# Patient Record
Sex: Male | Born: 1970 | Race: White | Hispanic: No | Marital: Married | Smoking: Current some day smoker
Health system: Southern US, Community
[De-identification: ages and names within clinical notes are randomized; demographics above are authoritative.]

## PROBLEM LIST (undated history)

## (undated) DIAGNOSIS — G4730 Sleep apnea, unspecified: Secondary | ICD-10-CM

## (undated) DIAGNOSIS — N529 Male erectile dysfunction, unspecified: Secondary | ICD-10-CM

## (undated) DIAGNOSIS — M109 Gout, unspecified: Secondary | ICD-10-CM

## (undated) DIAGNOSIS — J189 Pneumonia, unspecified organism: Secondary | ICD-10-CM

## (undated) DIAGNOSIS — J449 Chronic obstructive pulmonary disease, unspecified: Secondary | ICD-10-CM

## (undated) DIAGNOSIS — I10 Essential (primary) hypertension: Secondary | ICD-10-CM

## (undated) HISTORY — DX: Gout, unspecified: M10.9

## (undated) HISTORY — DX: Male erectile dysfunction, unspecified: N52.9

## (undated) HISTORY — DX: Chronic obstructive pulmonary disease, unspecified: J44.9

## (undated) HISTORY — DX: Pneumonia, unspecified organism: J18.9

## (undated) HISTORY — DX: Sleep apnea, unspecified: G47.30

## (undated) HISTORY — PX: WISDOM TOOTH EXTRACTION: SHX21

---

## 2003-03-20 ENCOUNTER — Emergency Department (HOSPITAL_COMMUNITY): Admission: EM | Admit: 2003-03-20 | Discharge: 2003-03-20 | Payer: Self-pay | Admitting: Emergency Medicine

## 2007-04-24 ENCOUNTER — Emergency Department (HOSPITAL_COMMUNITY): Admission: EM | Admit: 2007-04-24 | Discharge: 2007-04-24 | Payer: Self-pay | Admitting: Emergency Medicine

## 2011-04-16 LAB — BLOOD GAS, ARTERIAL
Acid-base deficit: 1.2
FIO2: 21
Patient temperature: 37

## 2011-04-16 LAB — BASIC METABOLIC PANEL
BUN: 11
Calcium: 9.1
Chloride: 107
Creatinine, Ser: 1.07
Glucose, Bld: 108 — ABNORMAL HIGH
Sodium: 140

## 2011-04-16 LAB — CBC
MCHC: 35.4
RBC: 4.9
RDW: 12.3

## 2011-04-16 LAB — DIFFERENTIAL
Eosinophils Absolute: 0.2
Eosinophils Relative: 3
Lymphocytes Relative: 22

## 2015-03-03 ENCOUNTER — Emergency Department (HOSPITAL_COMMUNITY): Payer: BLUE CROSS/BLUE SHIELD

## 2015-03-03 ENCOUNTER — Encounter (HOSPITAL_COMMUNITY): Payer: Self-pay | Admitting: Emergency Medicine

## 2015-03-03 ENCOUNTER — Emergency Department (HOSPITAL_COMMUNITY)
Admission: EM | Admit: 2015-03-03 | Discharge: 2015-03-03 | Disposition: A | Discharge status: Home / Self Care | Payer: BLUE CROSS/BLUE SHIELD | Attending: Emergency Medicine | Admitting: Emergency Medicine

## 2015-03-03 DIAGNOSIS — S8991XA Unspecified injury of right lower leg, initial encounter: Secondary | ICD-10-CM | POA: Insufficient documentation

## 2015-03-03 DIAGNOSIS — I10 Essential (primary) hypertension: Secondary | ICD-10-CM | POA: Diagnosis not present

## 2015-03-03 DIAGNOSIS — Y9389 Activity, other specified: Secondary | ICD-10-CM | POA: Insufficient documentation

## 2015-03-03 DIAGNOSIS — Y9289 Other specified places as the place of occurrence of the external cause: Secondary | ICD-10-CM | POA: Insufficient documentation

## 2015-03-03 DIAGNOSIS — Z720 Tobacco use: Secondary | ICD-10-CM | POA: Insufficient documentation

## 2015-03-03 DIAGNOSIS — W010XXA Fall on same level from slipping, tripping and stumbling without subsequent striking against object, initial encounter: Secondary | ICD-10-CM | POA: Insufficient documentation

## 2015-03-03 DIAGNOSIS — Y998 Other external cause status: Secondary | ICD-10-CM | POA: Insufficient documentation

## 2015-03-03 DIAGNOSIS — M25461 Effusion, right knee: Secondary | ICD-10-CM

## 2015-03-03 DIAGNOSIS — Z880 Allergy status to penicillin: Secondary | ICD-10-CM | POA: Insufficient documentation

## 2015-03-03 HISTORY — DX: Essential (primary) hypertension: I10

## 2015-03-03 MED ORDER — OXYCODONE-ACETAMINOPHEN 5-325 MG PO TABS
1.0000 | ORAL_TABLET | Freq: Once | ORAL | Status: AC
Start: 2015-03-03 — End: 2015-03-03
  Administered 2015-03-03: 1 via ORAL
  Filled 2015-03-03: qty 1

## 2015-03-03 MED ORDER — IBUPROFEN 600 MG PO TABS: 600.0000 mg | ORAL_TABLET | Freq: Four times a day (QID) | ORAL | Status: DC | PRN

## 2015-03-03 MED ORDER — OXYCODONE-ACETAMINOPHEN 5-325 MG PO TABS: 1.0000 | ORAL_TABLET | ORAL | Status: DC | PRN

## 2015-03-03 NOTE — Discharge Instructions (Signed)

## 2015-03-03 NOTE — ED Notes (Signed)
Pt reports slipping and falling last night. Injury to R knee. Edema noted in R LE.

## 2015-03-04 NOTE — ED Provider Notes (Signed)
CSN: [id number]     Arrival date & time 03/03/15  1259 History   First MD Initiated Contact with Patient 03/03/15 1318     Chief Complaint  Patient presents with  . Knee Pain     (Consider location/radiation/quality/duration/timing/severity/associated sxs/prior Treatment) The history is provided by the patient.   Chase Christensen is a 44 y.o. male presenting with pain and swelling of his right knee after slipping and falling last night, twisting his right knee during the fall.  He was attempting to move a cow which had died by pulling using a rope when the injury occurred.  He has moderate pain with weight bearing but is comfortable at rest.  He woke today with increased swelling in the knee.  Denies prior injury to this joint and denies any other trauma with this fall.  He has taken no medicines prior to arrival but has applied ice.     Past Medical History  Diagnosis Date  . Hypertension    History reviewed. No pertinent past surgical history. Family History  Problem Relation Age of Onset  . Hypertension Mother   . Cancer Other   . Hypertension Other   . Diabetes Other    Social History  Substance Use Topics  . Smoking status: Current Every Day Smoker -- 0.50 packs/day    Types: Cigarettes  . Smokeless tobacco: Former Neurosurgeon  . Alcohol Use: Yes     Comment: occas    Review of Systems  Constitutional: Negative for fever.  Musculoskeletal: Positive for joint swelling and arthralgias. Negative for myalgias.  Skin: Negative for wound.  Neurological: Negative for weakness and numbness.      Allergies  Penicillins  Home Medications   Prior to Admission medications   Medication Sig Start Date End Date Taking? Authorizing Provider  ibuprofen (ADVIL,MOTRIN) 600 MG tablet Take 1 tablet (600 mg total) by mouth every 6 (six) hours as needed. 03/03/15   Burgess Amor, PA-C  oxyCODONE-acetaminophen (PERCOCET/ROXICET) 5-325 MG per tablet Take 1 tablet by mouth every 4 (four) hours  as needed. 03/03/15   Burgess Amor, PA-C   BP 148/94 mmHg  Pulse 91  Temp(Src) 98.1 F (36.7 C) (Oral)  Resp 20  Ht 5\' 3"  (1.6 m)  Wt 258 lb (117.028 kg)  BMI 45.71 kg/m2  SpO2 95% Physical Exam  Constitutional: He appears well-developed and well-nourished.  HENT:  Head: Atraumatic.  Neck: Normal range of motion.  Cardiovascular:  Pulses equal bilaterally  Musculoskeletal: He exhibits edema and tenderness.       Right knee: He exhibits effusion and bony tenderness. He exhibits no ecchymosis, no deformity, no erythema, normal alignment, no LCL laxity and no MCL laxity. No medial joint line, no lateral joint line, no MCL, no LCL and no patellar tendon tenderness noted.  ttp along upper medial right patella and at the distal rectus femoral tendon.  No palpable deformity.  Pt can SLR without knee joint weakness.  No anterior thigh ttp or deformity appreciated.  Neurological: He is alert. He has normal strength. He displays normal reflexes. No sensory deficit.  Skin: Skin is warm and dry. No erythema.  Skin intact.  Psychiatric: He has a normal mood and affect.    ED Course  Procedures (including critical care time) Labs Review Labs Reviewed - No data to display  Imaging Review Dg Knee Complete 4 Views Right  03/03/2015   CLINICAL DATA:  Injury while attempting to stand a cow up. Twisting and popping injury. Anterior  pain. Initial encounter.  EXAM: RIGHT KNEE - COMPLETE 4+ VIEW  COMPARISON:  None.  FINDINGS: Moderate to large suprapatellar joint effusion. Osseous irregularity about the medial joint space is favored to be degenerative and could represent 1 or more small intra-articular loose bodies. Mild medial and mild to moderate patellofemoral compartment osteoarthritis, including subchondral sclerosis.  IMPRESSION: Moderate to large joint effusion.  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 Suspect intra-articular loose body or bodies about the medial joint space. No donor site identified to suggest  acute fracture.   Electronically Signed   By: Jeronimo Greaves M.D.   On: 03/03/2015 13:51   I have personally reviewed and evaluated these images and lab results as part of my medical decision-making.   EKG Interpretation None      MDM   Final diagnoses:  Knee effusion, right     Radiological studies were viewed, interpreted and considered during the medical decision making and disposition process. I agree with radiologists reading.  Results were also discussed with patient.  Pt denies prior injury to the knee joint.  He was placed in a knee immobilizer and placed on crutches. Advised he will need close ortho f/u, referral given.  RICE advised.  Pt to call for f/u early this week.  Prescribed ibuprofen q 6 hours and oxycodone prn.       Burgess Amor, PA-C 03/04/15 0635  Burgess Amor, PA-C 03/04/15 1610  Zadie Rhine, MD 03/04/15 [phone]

## 2015-03-15 HISTORY — PX: KNEE ARTHROSCOPY: SUR90

## 2015-03-16 ENCOUNTER — Ambulatory Visit: Admit: 2015-03-16 | Payer: Self-pay | Admitting: Specialist

## 2015-03-16 SURGERY — ARTHROSCOPY, KNEE
Anesthesia: General | Site: Knee | Laterality: Right

## 2015-05-01 ENCOUNTER — Ambulatory Visit: Payer: Self-pay | Admitting: Family Medicine

## 2015-05-03 ENCOUNTER — Encounter: Payer: Self-pay | Admitting: Family Medicine

## 2015-05-03 ENCOUNTER — Ambulatory Visit (INDEPENDENT_AMBULATORY_CARE_PROVIDER_SITE_OTHER): Payer: BLUE CROSS/BLUE SHIELD | Admitting: Family Medicine

## 2015-05-03 VITALS — BP 129/85 | HR 92 | Temp 98.7°F | Ht 75.0 in | Wt 255.6 lb

## 2015-05-03 DIAGNOSIS — R5383 Other fatigue: Secondary | ICD-10-CM | POA: Insufficient documentation

## 2015-05-03 DIAGNOSIS — Z0000 Encounter for general adult medical examination without abnormal findings: Secondary | ICD-10-CM | POA: Diagnosis not present

## 2015-05-03 DIAGNOSIS — Z720 Tobacco use: Secondary | ICD-10-CM | POA: Insufficient documentation

## 2015-05-03 DIAGNOSIS — I10 Essential (primary) hypertension: Secondary | ICD-10-CM

## 2015-05-03 DIAGNOSIS — I152 Hypertension secondary to endocrine disorders: Secondary | ICD-10-CM | POA: Insufficient documentation

## 2015-05-03 DIAGNOSIS — R5382 Chronic fatigue, unspecified: Secondary | ICD-10-CM

## 2015-05-03 DIAGNOSIS — F17200 Nicotine dependence, unspecified, uncomplicated: Secondary | ICD-10-CM

## 2015-05-03 MED ORDER — CHLORTHALIDONE 25 MG PO TABS: 25.0000 mg | ORAL_TABLET | Freq: Every day | ORAL | Status: DC

## 2015-05-03 MED ORDER — AMLODIPINE BESYLATE 10 MG PO TABS: 10.0000 mg | ORAL_TABLET | Freq: Every day | ORAL | Status: DC

## 2015-05-03 NOTE — Progress Notes (Signed)
Subjective:    Patient ID: Chase Christensen, male    DOB: 13-Apr-1971, 44 y.o.   MRN: [id number]  HPI 44 year old gentleman presents for his first visit here. He grew up and rocking him [place] but has lived in [place] and now moved back. His problems include hypertension for which she takes amlodipine. He complains of tiredness and ability to follow asleep easily in the daytime. He has been told he snores and has apneic episodes at night. 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 He was told by previous examiner that his neck size would suggest that he may have it. He also has a history of gout with attacks 3 or 4 times a year which are generally relieved by Aleve. In the past he was also told that he has low testosterone. This was tested because of a general lack of energy and loss of sexual performance. He took injections for a short time but not long enough to see any improvement in his fatigue which in retrospect may have been due to sleep apnea all along. He is a smoker but is trying to quit and has scheduled quit date by end of the year. This is a gradual process for him. He has also tried to lose weight. He takes amlodipine for his blood pressure and does have some dependent edema by the end of the day.  There are no active problems to display for this patient.  Outpatient Encounter Prescriptions as of 05/03/2015  Medication Sig  . amLODipine (NORVASC) 10 MG tablet Take 1 tablet by mouth daily.  Marland Kitchen aspirin 325 MG tablet Take 325 mg by mouth daily.  Marland Kitchen ibuprofen (ADVIL,MOTRIN) 600 MG tablet Take 1 tablet (600 mg total) by mouth every 6 (six) hours as needed.  . [DISCONTINUED] oxyCODONE-acetaminophen (PERCOCET/ROXICET) 5-325 MG per tablet Take 1 tablet by mouth every 4 (four) hours as needed.   No facility-administered encounter medications on file as of 05/03/2015.      Review of Systems  Constitutional: Positive for fatigue.    Respiratory: Positive for apnea.   Cardiovascular: Positive for leg swelling.  Genitourinary: Negative.   Neurological: Negative.   Psychiatric/Behavioral: Negative.        Objective:   Physical Exam  Constitutional: He is oriented to person, place, and time. He appears well-developed and well-nourished.  HENT:  Head: Normocephalic.  Right Ear: External ear normal.  Left Ear: External ear normal.  Mouth/Throat: Oropharynx is clear and moist.  Eyes: Pupils are equal, round, and reactive to light.  Neck: Normal range of motion.  Cardiovascular: Normal rate, regular rhythm and normal heart sounds.   Pulmonary/Chest: Effort normal and breath sounds normal.  Abdominal: Soft.  Musculoskeletal: Normal range of motion.  Neurological: He is alert and oriented to person, place, and time.  Psychiatric: He has a normal mood and affect.          Assessment & Plan:  1. Annual physical exam Exam is normal but there are issues that need attention - CMP14+EGFR; Future - Testosterone,Free and Total; Future - Uric acid; Future - CBC with Differential/Platelet; Future  2. Essential hypertension Since he has edema on 10 mg amlodipine, will add chlorthalidone.  3. Chronic fatigue My suspicion is this is related to sleep apnea rather than low testosterone level but we will check the latter  4. Tobacco use disorder Patient in process of stopped smoking  Bertram Millard  Hyacinth Meeker MD  Notice: Thisdictation was prepared with Dragon dictation along with smaller phrase technology. Any transcriptional errors that result from this process are unintentional and may not be corrected upon review

## 2015-05-04 ENCOUNTER — Other Ambulatory Visit: Payer: BLUE CROSS/BLUE SHIELD

## 2015-05-04 DIAGNOSIS — Z0000 Encounter for general adult medical examination without abnormal findings: Secondary | ICD-10-CM

## 2015-05-04 NOTE — Progress Notes (Signed)
Lab only 

## 2015-05-05 LAB — CMP14+EGFR
ALBUMIN: 4.2 g/dL (ref 3.5–5.5)
ALT: 36 IU/L (ref 0–44)
AST: 26 IU/L (ref 0–40)
Albumin/Globulin Ratio: 1.9 (ref 1.1–2.5)
Alkaline Phosphatase: 92 IU/L (ref 39–117)
BUN / CREAT RATIO: 17 (ref 9–20)
BUN: 15 mg/dL (ref 6–24)
Bilirubin Total: 0.4 mg/dL (ref 0.0–1.2)
CALCIUM: 9.5 mg/dL (ref 8.7–10.2)
CO2: 24 mmol/L (ref 18–29)
CREATININE: 0.87 mg/dL (ref 0.76–1.27)
Chloride: 105 mmol/L (ref 97–106)
GFR calc non Af Amer: 106 mL/min/{1.73_m2} (ref >59)
GFR, EST AFRICAN AMERICAN: 122 mL/min/{1.73_m2} (ref >59)
GLUCOSE: 99 mg/dL (ref 65–99)
Globulin, Total: 2.2 g/dL (ref 1.5–4.5)
Potassium: 4.2 mmol/L (ref 3.5–5.2)
Sodium: 142 mmol/L (ref 136–144)
TOTAL PROTEIN: 6.4 g/dL (ref 6.0–8.5)

## 2015-05-05 LAB — CBC WITH DIFFERENTIAL/PLATELET
BASOS ABS: 0 10*3/uL (ref 0.0–0.2)
Basos: 0 %
EOS (ABSOLUTE): 0.4 10*3/uL (ref 0.0–0.4)
Eos: 4 %
HEMOGLOBIN: 16.8 g/dL (ref 12.6–17.7)
Hematocrit: 47.4 % (ref 37.5–51.0)
Immature Grans (Abs): 0 10*3/uL (ref 0.0–0.1)
Immature Granulocytes: 0 %
LYMPHS ABS: 2 10*3/uL (ref 0.7–3.1)
LYMPHS: 21 %
MCH: 33 pg (ref 26.6–33.0)
MCHC: 35.4 g/dL (ref 31.5–35.7)
MCV: 93 fL (ref 79–97)
MONOCYTES: 9 %
Monocytes Absolute: 0.8 10*3/uL (ref 0.1–0.9)
Neutrophils Absolute: 6.1 10*3/uL (ref 1.4–7.0)
Neutrophils: 66 %
PLATELETS: 261 10*3/uL (ref 150–379)
RBC: 5.09 x10E6/uL (ref 4.14–5.80)
RDW: 12.2 % — ABNORMAL LOW (ref 12.3–15.4)
WBC: 9.4 10*3/uL (ref 3.4–10.8)

## 2015-05-05 LAB — TESTOSTERONE,FREE AND TOTAL
TESTOSTERONE: 440 ng/dL (ref 348–1197)
Testosterone, Free: 21 pg/mL (ref 6.8–21.5)

## 2015-05-05 LAB — URIC ACID: URIC ACID: 7.6 mg/dL (ref 3.7–8.6)

## 2015-06-08 ENCOUNTER — Encounter: Payer: Self-pay | Admitting: Family Medicine

## 2015-06-08 ENCOUNTER — Ambulatory Visit (INDEPENDENT_AMBULATORY_CARE_PROVIDER_SITE_OTHER): Payer: BLUE CROSS/BLUE SHIELD | Admitting: Family Medicine

## 2015-06-08 VITALS — BP 149/91 | HR 99 | Temp 98.4°F | Ht 75.0 in | Wt 243.0 lb

## 2015-06-08 DIAGNOSIS — E1169 Type 2 diabetes mellitus with other specified complication: Secondary | ICD-10-CM | POA: Insufficient documentation

## 2015-06-08 DIAGNOSIS — N529 Male erectile dysfunction, unspecified: Secondary | ICD-10-CM | POA: Diagnosis not present

## 2015-06-08 DIAGNOSIS — I10 Essential (primary) hypertension: Secondary | ICD-10-CM | POA: Diagnosis not present

## 2015-06-08 DIAGNOSIS — R0683 Snoring: Secondary | ICD-10-CM | POA: Diagnosis not present

## 2015-06-08 MED ORDER — TADALAFIL 2.5 MG PO TABS: 2.5000 mg | ORAL_TABLET | Freq: Every day | ORAL | Status: DC

## 2015-06-08 MED ORDER — HYDROCHLOROTHIAZIDE 12.5 MG PO CAPS: 12.5000 mg | ORAL_CAPSULE | Freq: Every day | ORAL | Status: DC

## 2015-06-08 NOTE — Progress Notes (Signed)
   HPI  Patient presents today here to discuss erectile dysfunction and hypertension.  ED He states he's had issues for about 5-6 years. He struggled maintaining and getting an erection. He is called his insurance company and Cialis is the best covered medication. He has no chest pain.  Hypertension He does have headaches related to his hypertension when he misses a dose of medication. He never started chlorthalidone He has moderate amlodipine compliance missing a few days a month. He denies chest pain, dyspnea He does have mild leg edema  Snoring Patient explains that he has severe snoring, his girlfriend is present with him in the clinic and agrees  Has severe fatigue throughout the day, he would like to have a sleep study done at home and is working through his insurance to see if this could be arranged.  PMH: Smoking status noted ROS: Per HPI  Objective: BP 149/91 mmHg  Pulse 99  Temp(Src) 98.4 F (36.9 C) (Oral)  Ht 6\' 3"  (1.905 m)  Wt 243 lb (110.224 kg)  BMI 30.37 kg/m2 Gen: NAD, alert, cooperative with exam HEENT: NCAT CV: RRR, good S1/S2, no murmur Resp: CTABL, no wheezes, non-labored Ext: No edema, warm Neuro: Alert and oriented, No gross deficits  Assessment and plan:  # Hypertension Continue amlodipine Started 0.5 mg HCTZ Blood pressure log, follow-up one month  # Erectile dysfunction Cialis, discussed daily dosing which is what they prefer, however they're considering changing it to 5-10 mg as needed  # Snoring Possible sleep apnea, he is exploring his insurance coverage for sleep study Agree with ordering the sleep study should he get it arranged the way that he wants.    Meds ordered this encounter  Medications  . Tadalafil 2.5 MG TABS    Sig: Take 1 tablet (2.5 mg total) by mouth daily.    Dispense:  90 each    Refill:  3  . hydrochlorothiazide (MICROZIDE) 12.5 MG capsule    Sig: Take 1 capsule (12.5 mg total) by mouth daily.   Dispense:  90 capsule    Refill:  3    Murtis SinkSam Bradshaw, MD [facility] 06/08/2015, 4:36 PM

## 2015-06-08 NOTE — Patient Instructions (Signed)
Great to meet you!  Come back in 1 month, keep a blood pressure log  If you decide to use cialis as needed you can use 5 to 10 mg at a time.

## 2015-06-18 ENCOUNTER — Ambulatory Visit: Payer: BLUE CROSS/BLUE SHIELD | Admitting: Family Medicine

## 2015-06-19 ENCOUNTER — Encounter: Payer: Self-pay | Admitting: Family Medicine

## 2015-07-10 ENCOUNTER — Ambulatory Visit: Payer: BLUE CROSS/BLUE SHIELD | Admitting: Family Medicine

## 2015-07-13 ENCOUNTER — Ambulatory Visit (INDEPENDENT_AMBULATORY_CARE_PROVIDER_SITE_OTHER): Payer: BLUE CROSS/BLUE SHIELD | Admitting: Family Medicine

## 2015-07-13 ENCOUNTER — Encounter: Payer: Self-pay | Admitting: Family Medicine

## 2015-07-13 VITALS — BP 134/88 | HR 95 | Temp 97.7°F | Ht 75.0 in | Wt 243.6 lb

## 2015-07-13 DIAGNOSIS — I10 Essential (primary) hypertension: Secondary | ICD-10-CM | POA: Diagnosis not present

## 2015-07-13 DIAGNOSIS — R079 Chest pain, unspecified: Secondary | ICD-10-CM | POA: Diagnosis not present

## 2015-07-13 DIAGNOSIS — N529 Male erectile dysfunction, unspecified: Secondary | ICD-10-CM

## 2015-07-13 NOTE — Patient Instructions (Signed)
Great to see you!  Lets see you back in 3 months unless you need us sooner.   You will be called by cardiology for an appointment  Consider stopping smoking, We could try wellbutrin.   Nonspecific Chest Pain  Chest pain can be caused by many different conditions. There is always a chance that your pain could be related to something serious, such as a heart attack or a blood clot in your lungs. Chest pain can also be caused by conditions that are not life-threatening. If you have chest pain, it is very important to follow up with your health care provider. CAUSES  Chest pain can be caused by:  Heartburn.  Pneumonia or bronchitis.  Anxiety or stress.  Inflammation around your heart (pericarditis) or lung (pleuritis or pleurisy).  A blood clot in your lung.  A collapsed lung (pneumothorax). It can develop suddenly on its own (spontaneous pneumothorax) or from trauma to the chest.  Shingles infection (varicella-zoster virus).  Heart attack.  Damage to the bones, muscles, and cartilage that make up your chest wall. This can include:  Bruised bones due to injury.  Strained muscles or cartilage due to frequent or repeated coughing or overwork.  Fracture to one or more ribs.  Sore cartilage due to inflammation (costochondritis). RISK FACTORS  Risk factors for chest pain may include:  Activities that increase your risk for trauma or injury to your chest.  Respiratory infections or conditions that cause frequent coughing.  Medical conditions or overeating that can cause heartburn.  Heart disease or family history of heart disease.  Conditions or health behaviors that increase your risk of developing a blood clot.  Having had chicken pox (varicella zoster). SIGNS AND SYMPTOMS Chest pain can feel like:  Burning or tingling on the surface of your chest or deep in your chest.  Crushing, pressure, aching, or squeezing pain.  Dull or [place] pain that is worse when you move,  cough, or take a deep breath.  Pain that is also felt in your back, neck, shoulder, or arm, or pain that spreads to any of these areas. Your chest pain may come and go, or it may stay constant. DIAGNOSIS Lab tests or other studies may be needed to find the cause of your pain. Your health care provider may have you take a test called an ambulatory ECG (electrocardiogram). An ECG records your heartbeat patterns at the time the test is performed. You may also have other tests, such as:  Transthoracic echocardiogram (TTE). During echocardiography, sound waves are used to create a picture of all of the heart structures and to look at how blood flows through your heart.  Transesophageal echocardiogram (TEE).This is a more advanced imaging test that obtains images from inside your body. It allows your health care provider to see your heart in finer detail.  Cardiac monitoring. This allows your health care provider to monitor your heart rate and rhythm in real time.  Holter monitor. This is a portable device that records your heartbeat and can help to diagnose abnormal heartbeats. It allows your health care provider to track your heart activity for several days, if needed.  Stress tests. These can be done through exercise or by taking medicine that makes your heart beat more quickly.  Blood tests.  Imaging tests. TREATMENT  Your treatment depends on what is causing your chest pain. Treatment may include:  Medicines. These may include:  Acid blockers for heartburn.  Anti-inflammatory medicine.  Pain medicine for inflammatory conditions.  Antibiotic medicine, if an infection is present.  Medicines to dissolve blood clots.  Medicines to treat coronary artery disease.  Supportive care for conditions that do not require medicines. This may include:  Resting.  Applying heat or cold packs to injured areas.  Limiting activities until pain decreases. HOME CARE INSTRUCTIONS  If you were  prescribed an antibiotic medicine, finish it all even if you start to feel better.  Avoid any activities that bring on chest pain.  Do not use any tobacco products, including cigarettes, chewing tobacco, or electronic cigarettes. If you need help quitting, ask your health care provider.  Do not drink alcohol.  Take medicines only as directed by your health care provider.  Keep all follow-up visits as directed by your health care provider. This is important. This includes any further testing if your chest pain does not go away.  If heartburn is the cause for your chest pain, you may be told to keep your head raised (elevated) while sleeping. This reduces the chance that acid will go from your stomach into your esophagus.  Make lifestyle changes as directed by your health care provider. These may include:  Getting regular exercise. Ask your health care provider to suggest some activities that are safe for you.  Eating a heart-healthy diet. A registered dietitian can help you to learn healthy eating options.  Maintaining a healthy weight.  Managing diabetes, if necessary.  Reducing stress. SEEK MEDICAL CARE IF:  Your chest pain does not go away after treatment.  You have a rash with blisters on your chest.  You have a fever. SEEK IMMEDIATE MEDICAL CARE IF:   Your chest pain is worse.  You have an increasing cough, or you cough up blood.  You have severe abdominal pain.  You have severe weakness.  You faint.  You have chills.  You have sudden, unexplained chest discomfort.  You have sudden, unexplained discomfort in your arms, back, neck, or jaw.  You have shortness of breath at any time.  You suddenly start to sweat, or your skin gets clammy.  You feel nauseous or you vomit.  You suddenly feel light-headed or dizzy.  Your heart begins to beat quickly, or it feels like it is skipping beats. These symptoms may represent a serious problem that is an emergency. Do  not wait to see if the symptoms will go away. Get medical help right away. Call your local emergency services (911 in the [place] Do not drive yourself to the hospital.   This information is not intended to replace advice given to you by your health care provider. Make sure you discuss any questions you have with your health care provider.   Document Released: 04/02/2005 Document Revised: 07/14/2014 Document Reviewed: 01/27/2014 Elsevier Interactive Patient Education [employer]

## 2015-07-13 NOTE — Progress Notes (Signed)
   HPI  Patient presents today today discussed hypertension, chest pain, and erectile dysfunction.  Hypertension Good medication compliance Not checking blood pressure at home No headache, palpitations, leg edema.  Chest pain Over the last 3 weeks he's had intermittent right-sided dull achy chest pain that radiates to his right-sided back. He comes and goes with anxious mood, but also is more noticeable whenever he is working vigorously. It does not worsen with palpation. He has no sweating, shortness of breath, or nausea with it. It is not predictably exertional.  Erectile dysfunction He's been trying Cialis at 10 mg with not much improvement. He's tried Levitra 10 mg and had good improvement.   PMH: Smoking status noted ROS: Per HPI  Objective: BP 134/88 mmHg  Pulse 95  Temp(Src) 97.7 F (36.5 C) (Oral)  Ht 6\' 3"  (1.905 m)  Wt 243 lb 9.6 oz (110.496 kg)  BMI 30.45 kg/m2 Gen: NAD, alert, cooperative with exam HEENT: NCAT CV: RRR, good S1/S2, no murmur, no chest wall tenderness Resp: CTABL, no wheezes, non-labored Ext: No edema, warm Neuro: Alert and oriented, No gross deficits Psych- Denies SI  EKG- NSR   Assessment and plan:  # Hypertension Reasonable control Continue amlodipine and HCTZ Chest pain, see below  # Chest pain Atypical chest pain, sometimes exertional but not predictable Most likely associated with mood, he's had some recent stressors. EKG normal today Reviewed red flags for seeking emergent medical care Cardiology for possible exercise stress test  # erectile dysfunction Recommended daily dosing with Cialis, I'm okay with prescribing Levitra his insurance will cover it    Orders Placed This Encounter  Procedures  . EKG 12-Lead     Murtis SinkSam Shaneka Efaw, MD [facility] 07/13/2015, 4:05 PM

## 2015-07-16 ENCOUNTER — Ambulatory Visit: Payer: BLUE CROSS/BLUE SHIELD | Admitting: Family Medicine

## 2015-08-03 ENCOUNTER — Ambulatory Visit (INDEPENDENT_AMBULATORY_CARE_PROVIDER_SITE_OTHER): Payer: BLUE CROSS/BLUE SHIELD | Admitting: Pediatrics

## 2015-08-03 ENCOUNTER — Encounter: Payer: Self-pay | Admitting: Pediatrics

## 2015-08-03 VITALS — BP 129/83 | HR 94 | Temp 97.0°F | Ht 75.0 in | Wt 235.0 lb

## 2015-08-03 DIAGNOSIS — R197 Diarrhea, unspecified: Secondary | ICD-10-CM

## 2015-08-03 DIAGNOSIS — N529 Male erectile dysfunction, unspecified: Secondary | ICD-10-CM | POA: Diagnosis not present

## 2015-08-03 DIAGNOSIS — R112 Nausea with vomiting, unspecified: Secondary | ICD-10-CM | POA: Diagnosis not present

## 2015-08-03 MED ORDER — SILDENAFIL CITRATE 20 MG PO TABS: ORAL_TABLET | ORAL | Status: DC

## 2015-08-03 MED ORDER — ONDANSETRON 4 MG PO TBDP: 4.0000 mg | ORAL_TABLET | Freq: Three times a day (TID) | ORAL | Status: DC | PRN

## 2015-08-03 NOTE — Progress Notes (Signed)
Subjective:    Patient ID: Chase Christensen, male    DOB: 1970-12-11, 45 y.o.   MRN: [id number]  CC: Emesis; Diarrhea; and Nausea   HPI: Chase Christensen is a 45 y.o. male presenting for Emesis; Diarrhea; and Nausea   Ongoing watery diarrhea with no blood for the past 4-5 days. First day of illness with nausea and vomiting  For 24h. Improved nausea until this morning, started again. no vomiting, continues to have diarrhea unchanged. Several times throughout the day. Has had accidents of bowels  Ate some toast this morning, kept it down Drinking fluids Normal urination No abdominal pain since the first day, had some epigastric pain that went away within the first day. No fevers No unusual travel, foods. Works in Airline pilot maintenance   Depression screen [facility] 2/9 08/03/2015 07/13/2015  Decreased Interest 0 1  Down, Depressed, Hopeless 0 3  PHQ - 2 Score 0 4  Altered sleeping - 3  Tired, decreased energy - 3  Change in appetite - 3  Feeling bad or failure about yourself  - 0  Trouble concentrating - 0  Moving slowly or fidgety/restless - 0  Suicidal thoughts - 0  PHQ-9 Score - 13     Relevant past medical, surgical, family and social history reviewed and updated as indicated. Interim medical history since our last visit reviewed. Allergies and medications reviewed and updated.    ROS: Per HPI unless specifically indicated above  History  Smoking status  . Current Every Day Smoker -- 0.25 packs/day  . Types: Cigarettes  Smokeless tobacco  . Former Neurosurgeon    Past Medical History Patient Active Problem List   Diagnosis Date Noted  . Chest pain 07/13/2015  . Erectile dysfunction 06/08/2015  . Snoring 06/08/2015  . Essential hypertension 05/03/2015  . Fatigue 05/03/2015  . Tobacco use disorder 05/03/2015    Current Outpatient Prescriptions  Medication Sig Dispense Refill  . amLODipine (NORVASC) 10 MG tablet Take 1 tablet (10 mg total) by mouth daily. 90 tablet 3  .  hydrochlorothiazide (MICROZIDE) 12.5 MG capsule Take 1 capsule (12.5 mg total) by mouth daily. 90 capsule 3  . ibuprofen (ADVIL,MOTRIN) 600 MG tablet Take 1 tablet (600 mg total) by mouth every 6 (six) hours as needed. 30 tablet 0  . ondansetron (ZOFRAN-ODT) 4 MG disintegrating tablet Take 1 tablet (4 mg total) by mouth every 8 (eight) hours as needed for nausea or vomiting. 6 tablet 0  . sildenafil (REVATIO) 20 MG tablet Take 2-5 tablets as needed for sexual activity 50 tablet 0   No current facility-administered medications for this visit.       Objective:    BP 129/83 mmHg  Pulse 94  Temp(Src) 97 F (36.1 C) (Oral)  Ht 6\' 3"  (1.905 m)  Wt 235 lb (106.595 kg)  BMI 29.37 kg/m2  Wt Readings from Last 3 Encounters:  08/03/15 235 lb (106.595 kg)  07/13/15 243 lb 9.6 oz (110.496 kg)  06/08/15 243 lb (110.224 kg)     Gen: NAD, alert, cooperative with exam, NCAT EYES: EOMI, no scleral injection or icterus ENT:  OP without erythema LYMPH: no cervical LAD CV: NRRR, normal S1/S2, no murmur, distal pulses 2+ b/l Resp: CTABL, no wheezes, normal WOB Abd: +BS, soft, NTND. no guarding or organomegaly Ext: No edema, warm Neuro: Alert and oriented, strength equal b/l UE and LE, coordination grossly normal MSK: normal muscle bulk     Assessment & Plan:    Chase Christensen  was seen today for emesis, diarrhea and nausea, ongoing for 5 days. Also for erectile dysfunction.  Diagnoses and all orders for this visit:  Watery diarrhea -     Clostridium difficile EIA -     Ova and parasite examination -     CBC -     CMP14+EGFR  Erectile dysfunction, unspecified erectile dysfunction type -     sildenafil (REVATIO) 20 MG tablet; Take 2-5 tablets as needed for sexual activity  Nausea and vomiting, intractability of vomiting not specified, unspecified vomiting type -     ondansetron (ZOFRAN-ODT) 4 MG disintegrating tablet; Take 1 tablet (4 mg total) by mouth every 8 (eight) hours as needed for nausea  or vomiting.   Follow up plan: Return if symptoms worsen or fail to improve.  Rex Kras, MD [facility] 08/03/2015, 8:24 PM

## 2015-08-04 LAB — CMP14+EGFR
A/G RATIO: 2 (ref 1.1–2.5)
ALT: 43 IU/L (ref 0–44)
AST: 31 IU/L (ref 0–40)
Albumin: 4.1 g/dL (ref 3.5–5.5)
Alkaline Phosphatase: 82 IU/L (ref 39–117)
BILIRUBIN TOTAL: 0.5 mg/dL (ref 0.0–1.2)
BUN/Creatinine Ratio: 14 (ref 9–20)
BUN: 13 mg/dL (ref 6–24)
CO2: 24 mmol/L (ref 18–29)
Calcium: 9.4 mg/dL (ref 8.7–10.2)
Chloride: 106 mmol/L (ref 96–106)
Creatinine, Ser: 0.9 mg/dL (ref 0.76–1.27)
GFR calc Af Amer: 121 mL/min/{1.73_m2} (ref >59)
GFR, EST NON AFRICAN AMERICAN: 104 mL/min/{1.73_m2} (ref >59)
GLUCOSE: 105 mg/dL — AB (ref 65–99)
Globulin, Total: 2.1 g/dL (ref 1.5–4.5)
Potassium: 3.8 mmol/L (ref 3.5–5.2)
Sodium: 146 mmol/L — ABNORMAL HIGH (ref 134–144)
TOTAL PROTEIN: 6.2 g/dL (ref 6.0–8.5)

## 2015-08-04 LAB — CBC
Hematocrit: 47.2 % (ref 37.5–51.0)
Hemoglobin: 16.4 g/dL (ref 12.6–17.7)
MCH: 32.2 pg (ref 26.6–33.0)
MCHC: 34.7 g/dL (ref 31.5–35.7)
MCV: 93 fL (ref 79–97)
PLATELETS: 227 10*3/uL (ref 150–379)
RBC: 5.09 x10E6/uL (ref 4.14–5.80)
RDW: 13.2 % (ref 12.3–15.4)
WBC: 10.7 10*3/uL (ref 3.4–10.8)

## 2015-08-07 ENCOUNTER — Telehealth: Payer: Self-pay

## 2015-08-07 NOTE — Telephone Encounter (Signed)
Insurance will not cover Sildenafil except for Pulmonary HTN  They will cover Cialis and if can't do Cialis , Viagra

## 2015-08-08 ENCOUNTER — Ambulatory Visit: Payer: BLUE CROSS/BLUE SHIELD | Admitting: Cardiovascular Disease

## 2015-08-08 NOTE — Telephone Encounter (Signed)
I believe we gave him the coupon for viagra with a paper Rx to take the coupon. I can send in cialis if he wants.

## 2015-08-13 NOTE — Telephone Encounter (Signed)
lmtcb

## 2015-08-14 ENCOUNTER — Encounter: Payer: Self-pay | Admitting: *Deleted

## 2015-08-17 ENCOUNTER — Ambulatory Visit: Payer: BLUE CROSS/BLUE SHIELD | Admitting: Cardiovascular Disease

## 2015-08-20 ENCOUNTER — Encounter: Payer: Self-pay | Admitting: *Deleted

## 2015-08-22 NOTE — Telephone Encounter (Signed)
Several attempts have been made to contact patient. This encounter will be closed.  

## 2015-08-31 ENCOUNTER — Other Ambulatory Visit: Payer: Self-pay | Admitting: Family Medicine

## 2015-08-31 DIAGNOSIS — N529 Male erectile dysfunction, unspecified: Secondary | ICD-10-CM

## 2015-08-31 MED ORDER — SILDENAFIL CITRATE 20 MG PO TABS: ORAL_TABLET | ORAL | Status: DC

## 2015-08-31 NOTE — Telephone Encounter (Signed)
Rx Sent  Murtis Sink, MD [facility] 08/31/2015, 3:04 PM

## 2015-08-31 NOTE — Telephone Encounter (Signed)
vm left rx requested has been sent to the pharmacy.

## 2015-10-05 ENCOUNTER — Telehealth: Payer: Self-pay

## 2015-10-05 ENCOUNTER — Other Ambulatory Visit: Payer: Self-pay | Admitting: Family Medicine

## 2015-10-05 NOTE — Telephone Encounter (Signed)
Patient spoke with Doreatha MassedMitzi Moore and wanted refill on his Revatio 20 mg sent to [facility].  I contacted patient and received their voicemail, left a message to call us back.  Need to speak with patient about his request, earlier call from patient states his insurance will not cover this medication, unsure of what he needs.  Waiting on return call.

## 2015-10-05 NOTE — Telephone Encounter (Signed)
Patient would like to know if this dosage can be increased.  Please advise.

## 2015-10-08 MED ORDER — SILDENAFIL CITRATE 20 MG PO TABS: ORAL_TABLET | ORAL | Status: DC

## 2015-10-08 NOTE — Telephone Encounter (Signed)
Refilled, he is probably paying cash price.   Murtis SinkSam Bradshaw, MD [facility] 10/08/2015, 8:41 AM

## 2015-12-31 ENCOUNTER — Encounter: Payer: Self-pay | Admitting: Family Medicine

## 2015-12-31 ENCOUNTER — Ambulatory Visit (INDEPENDENT_AMBULATORY_CARE_PROVIDER_SITE_OTHER): Payer: BLUE CROSS/BLUE SHIELD | Admitting: Family Medicine

## 2015-12-31 VITALS — BP 147/101 | HR 69 | Temp 97.3°F | Ht 75.0 in | Wt 242.8 lb

## 2015-12-31 DIAGNOSIS — R0683 Snoring: Secondary | ICD-10-CM | POA: Diagnosis not present

## 2015-12-31 DIAGNOSIS — R5382 Chronic fatigue, unspecified: Secondary | ICD-10-CM | POA: Diagnosis not present

## 2015-12-31 DIAGNOSIS — R079 Chest pain, unspecified: Secondary | ICD-10-CM | POA: Diagnosis not present

## 2015-12-31 DIAGNOSIS — I10 Essential (primary) hypertension: Secondary | ICD-10-CM | POA: Diagnosis not present

## 2015-12-31 MED ORDER — SILDENAFIL CITRATE 20 MG PO TABS: ORAL_TABLET | ORAL | Status: DC

## 2015-12-31 MED ORDER — LISINOPRIL 20 MG PO TABS: 20.0000 mg | ORAL_TABLET | Freq: Every day | ORAL | Status: DC

## 2015-12-31 NOTE — Patient Instructions (Signed)
Great to see you!  Consider quitting smoking.   You will be called to set up an appointment with Dr. Maple HudsonYoung.

## 2015-12-31 NOTE — Addendum Note (Signed)
Addended by: Bearl MulberryUTHERFORD, Brienne Liguori K on: 12/31/2015 06:10 PM   Modules accepted: Orders

## 2015-12-31 NOTE — Progress Notes (Addendum)
   HPI  Patient presents today with daytime sleepiness, fatigue, and chest pain.   Chest pain Nonexertional, comes on and stays for 10-20 minutes described as right-sided, sometimes epigastric pain. He denies any shortness of breath, sweating, or palpitations during episodes. Previous EKG was normal.  Snoring With daytime sleepiness, his girlfriend is an EMT and states that he has periods of apnea. He has been very resistant to getting a sleep study previously but is finally tired of being tired most of the time.  Smoking Not really ready to quit, however he's considering it.   PMH: Smoking status noted ROS: Per HPI  Objective: BP 147/101 mmHg  Pulse 69  Temp(Src) 97.3 F (36.3 C) (Oral)  Ht 6\' 3"  (1.905 m)  Wt 242 lb 12.8 oz (110.133 kg)  BMI 30.35 kg/m2 Gen: NAD, alert, cooperative with exam HEENT: NCAT, CV: RRR, good S1/S2, no murmur Resp: CTABL, no wheezes, non-labored Ext: No edema, warm Neuro: Alert and oriented, No gross deficits  Assessment and plan:  # Decreased energy, fatigue Likely multifactorial Encouraged patient to get a sleep study considering his daytime sleepiness and snoring Also recommended quitting smoking Also recommended cutting out sugar in his diet during the day. Refer  to pulmonology for sleep study  # Chest pain Atypical Encouraged him to get his lipid panel, this is been ordered for quite some time Recommend stress test in our clinic  Snoring With daytime sleepiness Refer to pulmonology for sleep study  HTN Changing HCTZ to lisinopril, hes concerned about dehydration.  Recheck labs in 1 month     Orders Placed This Encounter  Procedures  . Ambulatory referral to Pulmonology    Referral Priority:  Routine    Referral Type:  Consultation    Referral Reason:  Specialty Services Required    Requested Specialty:  Pulmonary Disease    Number of Visits Requested:  1    Murtis SinkSam Bradshaw, MD [facility] 12/31/2015, 4:57 PM

## 2016-01-01 LAB — LIPID PANEL
CHOLESTEROL TOTAL: 127 mg/dL (ref 100–199)
Chol/HDL Ratio: 3.5 ratio units (ref 0.0–5.0)
HDL: 36 mg/dL — AB (ref >39)
LDL Calculated: 56 mg/dL (ref 0–99)
TRIGLYCERIDES: 174 mg/dL — AB (ref 0–149)
VLDL Cholesterol Cal: 35 mg/dL (ref 5–40)

## 2016-01-01 LAB — BMP8+EGFR
BUN/Creatinine Ratio: 25 — ABNORMAL HIGH (ref 9–20)
BUN: 20 mg/dL (ref 6–24)
CALCIUM: 9.4 mg/dL (ref 8.7–10.2)
CHLORIDE: 107 mmol/L — AB (ref 96–106)
CO2: 20 mmol/L (ref 18–29)
Creatinine, Ser: 0.79 mg/dL (ref 0.76–1.27)
GFR calc non Af Amer: 110 mL/min/{1.73_m2} (ref >59)
GFR, EST AFRICAN AMERICAN: 127 mL/min/{1.73_m2} (ref >59)
GLUCOSE: 88 mg/dL (ref 65–99)
POTASSIUM: 4.2 mmol/L (ref 3.5–5.2)
Sodium: 145 mmol/L — ABNORMAL HIGH (ref 134–144)

## 2016-01-01 MED ORDER — SILDENAFIL CITRATE 20 MG PO TABS: ORAL_TABLET | ORAL | Status: DC

## 2016-01-01 NOTE — Addendum Note (Signed)
Addended by: Angela AdamOSTOSKY, Elsia Lasota C on: 01/01/2016 04:29 PM   Modules accepted: Orders

## 2016-05-17 ENCOUNTER — Encounter (HOSPITAL_COMMUNITY): Payer: Self-pay | Admitting: Cardiology

## 2016-05-17 ENCOUNTER — Emergency Department (HOSPITAL_COMMUNITY)
Admission: EM | Admit: 2016-05-17 | Discharge: 2016-05-17 | Disposition: A | Discharge status: Home / Self Care | Payer: BLUE CROSS/BLUE SHIELD | Attending: Emergency Medicine | Admitting: Emergency Medicine

## 2016-05-17 ENCOUNTER — Emergency Department (HOSPITAL_COMMUNITY): Payer: BLUE CROSS/BLUE SHIELD

## 2016-05-17 DIAGNOSIS — Y929 Unspecified place or not applicable: Secondary | ICD-10-CM | POA: Diagnosis not present

## 2016-05-17 DIAGNOSIS — F17210 Nicotine dependence, cigarettes, uncomplicated: Secondary | ICD-10-CM | POA: Insufficient documentation

## 2016-05-17 DIAGNOSIS — W268XXA Contact with other sharp object(s), not elsewhere classified, initial encounter: Secondary | ICD-10-CM | POA: Insufficient documentation

## 2016-05-17 DIAGNOSIS — Z791 Long term (current) use of non-steroidal anti-inflammatories (NSAID): Secondary | ICD-10-CM | POA: Insufficient documentation

## 2016-05-17 DIAGNOSIS — Y999 Unspecified external cause status: Secondary | ICD-10-CM | POA: Diagnosis not present

## 2016-05-17 DIAGNOSIS — I10 Essential (primary) hypertension: Secondary | ICD-10-CM | POA: Diagnosis not present

## 2016-05-17 DIAGNOSIS — Y9389 Activity, other specified: Secondary | ICD-10-CM | POA: Insufficient documentation

## 2016-05-17 DIAGNOSIS — Z79899 Other long term (current) drug therapy: Secondary | ICD-10-CM | POA: Diagnosis not present

## 2016-05-17 DIAGNOSIS — S61012A Laceration without foreign body of left thumb without damage to nail, initial encounter: Secondary | ICD-10-CM | POA: Diagnosis not present

## 2016-05-17 DIAGNOSIS — S61011A Laceration without foreign body of right thumb without damage to nail, initial encounter: Secondary | ICD-10-CM

## 2016-05-17 DIAGNOSIS — S6992XA Unspecified injury of left wrist, hand and finger(s), initial encounter: Secondary | ICD-10-CM | POA: Diagnosis present

## 2016-05-17 MED ORDER — HYDROCODONE-ACETAMINOPHEN 5-325 MG PO TABS: 2.0000 | ORAL_TABLET | ORAL | 0 refills | Status: DC | PRN

## 2016-05-17 MED ORDER — BUPIVACAINE HCL (PF) 0.5 % IJ SOLN
10.0000 mL | Freq: Once | INTRAMUSCULAR | Status: DC
  Filled 2016-05-17: qty 30

## 2016-05-17 NOTE — Discharge Instructions (Signed)
Suture removal in 8 days  °

## 2016-05-17 NOTE — ED Triage Notes (Signed)
Chainsaw injury to left thumb. Laceration

## 2016-05-18 NOTE — ED Provider Notes (Signed)
[facility]ER[facility]ENCY DEPT Provider Note   CSN: [id number] Arrival date & time: 05/17/16  1234     History   Chief Complaint Chief Complaint  Patient presents with  . Finger Injury    HPI Chase Christensen is a 45 y.o. male.  The history is provided by the patient. No language interpreter was used.  Hand Pain  This is a new problem. The current episode started 1 to 2 hours ago. The problem occurs constantly. The problem has not changed since onset.Pertinent negatives include no headaches. Nothing aggravates the symptoms. Nothing relieves the symptoms. He has tried nothing for the symptoms. The treatment provided no relief.   Pt reports he his father in law slipped with a chain saw and cut his thumb. Past Medical History:  Diagnosis Date  . Gout   . Hypertension     Patient Active Problem List   Diagnosis Date Noted  . Chest pain 07/13/2015  . Erectile dysfunction 06/08/2015  . Snoring 06/08/2015  . Essential hypertension 05/03/2015  . Fatigue 05/03/2015  . Tobacco use disorder 05/03/2015    Past Surgical History:  Procedure Laterality Date  . KNEE SURGERY Right 03/15/2015  . WISDOM TOOTH EXTRACTION         Home Medications    Prior to Admission medications   Medication Sig Start Date End Date Taking? Authorizing Provider  amLODipine (NORVASC) 10 MG tablet Take 1 tablet (10 mg total) by mouth daily. 05/03/15   Frederica KusterStephen M Miller, MD  HYDROcodone-acetaminophen (NORCO/VICODIN) 5-325 MG tablet Take 2 tablets by mouth every 4 (four) hours as needed. 05/17/16   Elson AreasLeslie K Neli Fofana, PA-C  ibuprofen (ADVIL,MOTRIN) 600 MG tablet Take 1 tablet (600 mg total) by mouth every 6 (six) hours as needed. 03/03/15   Burgess AmorJulie Idol, PA-C  lisinopril (PRINIVIL,ZESTRIL) 20 MG tablet Take 1 tablet (20 mg total) by mouth daily. 12/31/15   Elenora GammaSamuel L Bradshaw, MD  sildenafil (REVATIO) 20 MG tablet Take 2 to 5 pills once daily as needed for ED 01/01/16   Elenora GammaSamuel L Bradshaw, MD    Family History Family  History  Problem Relation Age of Onset  . Hypertension Mother   . Cancer Other   . Hypertension Other   . Diabetes Other     Social History Social History  Substance Use Topics  . Smoking status: Current Every Day Smoker    Packs/day: 0.25    Types: Cigarettes  . Smokeless tobacco: Former NeurosurgeonUser  . Alcohol use Yes     Comment: occas     Allergies   Penicillins   Review of Systems Review of Systems  Skin: Positive for wound.  Neurological: Negative for headaches.  All other systems reviewed and are negative.    Physical Exam Updated Vital Signs BP 145/99 (BP Location: Right Arm)   Pulse 96   Temp 98.7 F (37.1 C) (Oral)   Resp 18   SpO2 96%   Physical Exam  Constitutional: He appears well-developed.  HENT:  Head: Normocephalic and atraumatic.  Cardiovascular: Normal rate.   Pulmonary/Chest: Effort normal.  Musculoskeletal: He exhibits tenderness.  Neurological: He is alert.  Skin: Skin is warm.  2cm laceration left thumb dorsal aspect  gapping,  From  nv and ns intact   Psychiatric: He has a normal mood and affect.  Nursing note and vitals reviewed.    ED Treatments / Results  Labs (all labs ordered are listed, but only abnormal results are displayed) Labs Reviewed - No data to display  EKG  EKG Interpretation None       Radiology Dg Finger Thumb Left  Result Date: 05/17/2016 CLINICAL DATA:  Chainsaw injury. EXAM: LEFT THUMB 2+V COMPARISON:  None. FINDINGS: Laceration in the soft tissues of the thumb with no underlying foreign body identified. No fracture is noted. IMPRESSION: Laceration with no foreign body or fracture identified. Electronically Signed   By: Gerome Samavid  Williams III M.D   On: 05/17/2016 13:46    Procedures .Marland Kitchen.Laceration Repair Date/Time: 05/18/2016 8:57 AM Performed by: Elson AreasSOFIA, Sharmane Dame K Authorized by: Elson AreasSOFIA, Madysyn Hanken K   Consent:    Consent obtained:  Verbal   Consent given by:  Patient   Alternatives discussed:  Delayed  treatment and no treatment Anesthesia (see MAR for exact dosages):    Anesthesia method:  Local infiltration   Local anesthetic:  Bupivacaine 0.5% w/o epi Laceration details:    Location:  Finger   Finger location:  L thumb   Length (cm):  2   Depth (mm):  2 Repair type:    Repair type:  Simple Pre-procedure details:    Preparation:  Patient was prepped and draped in usual sterile fashion Exploration:    Wound exploration: wound explored through full range of motion     Wound extent: no foreign bodies/material noted, no tendon damage noted, no underlying fracture noted and no vascular damage noted   Treatment:    Area cleansed with:  Betadine   Amount of cleaning:  Extensive   Irrigation solution:  Sterile saline   Irrigation method:  Syringe   Visualized foreign bodies/material removed: no   Skin repair:    Repair method:  Sutures   Suture size:  5-0   Suture material:  Prolene   Number of sutures:  4 Approximation:    Approximation:  Close   Vermilion border: well-aligned   Post-procedure details:    Dressing:  Non-adherent dressing   Patient tolerance of procedure:  Tolerated well, no immediate complications   (including critical care time)  Medications Ordered in ED Medications - No data to display   Initial Impression / Assessment and Plan / ED Course  I have reviewed the triage vital signs and the nursing notes.  Pertinent labs & imaging results that were available during my care of the patient were reviewed by me and considered in my medical decision making (see chart for details).  Clinical Course    Pt advised suture removal in 8 days.   Final Clinical Impressions(s) / ED Diagnoses   Final diagnoses:  Laceration of right thumb without foreign body, nail damage status unspecified, initial encounter    New Prescriptions Discharge Medication List as of 05/17/2016  3:23 PM       Elson AreasLeslie K Kaelon Weekes, PA-C 05/18/16 [id number]    Azalia BilisKevin Campos, MD 05/18/16  1151

## 2016-06-05 ENCOUNTER — Encounter: Payer: Self-pay | Admitting: Family Medicine

## 2016-06-05 ENCOUNTER — Ambulatory Visit (INDEPENDENT_AMBULATORY_CARE_PROVIDER_SITE_OTHER): Payer: BLUE CROSS/BLUE SHIELD | Admitting: Family Medicine

## 2016-06-05 VITALS — BP 136/86 | HR 93 | Temp 97.2°F | Ht 75.0 in | Wt 256.0 lb

## 2016-06-05 DIAGNOSIS — N529 Male erectile dysfunction, unspecified: Secondary | ICD-10-CM | POA: Diagnosis not present

## 2016-06-05 DIAGNOSIS — I10 Essential (primary) hypertension: Secondary | ICD-10-CM

## 2016-06-05 DIAGNOSIS — J181 Lobar pneumonia, unspecified organism: Secondary | ICD-10-CM

## 2016-06-05 DIAGNOSIS — J189 Pneumonia, unspecified organism: Secondary | ICD-10-CM

## 2016-06-05 MED ORDER — METHYLPREDNISOLONE ACETATE 80 MG/ML IJ SUSP
80.0000 mg | Freq: Once | INTRAMUSCULAR | Status: AC
  Administered 2016-06-05: 80 mg via INTRAMUSCULAR

## 2016-06-05 MED ORDER — SILDENAFIL CITRATE 20 MG PO TABS: ORAL_TABLET | ORAL | 4 refills | Status: DC

## 2016-06-05 MED ORDER — AZITHROMYCIN 250 MG PO TABS: ORAL_TABLET | ORAL | 0 refills | Status: DC

## 2016-06-05 NOTE — Progress Notes (Signed)
   HPI  Patient presents today with acute illness.  Patient's lines of the last 2 weeks she's had cough and cold symptoms. 2 weeks ago he was [facility] and treated for sinus infection with doxycycline. His sinus pressure improved.  However over the last week his cough and shortness of breath that gotten worse. He's developed left-sided thoracic back pain especially with cough or deep inspiration. He's had moderate white sputum. He has malaise.  Tolerating foods and fluids normally.  He's also having wheezing. He smokes off and on and has stopped lately.  Revatio helps his erectile dysfunction, he needs a refill.  Hypertension 130s at home, taking only lisinopril. No chest pain.  PMH: Smoking status noted ROS: Per HPI  Objective: BP 136/86   Pulse 93   Temp 97.2 F (36.2 C) (Oral)   Ht 6\' 3"  (1.905 m)   Wt 256 lb (116.1 kg)   BMI 32.00 kg/m  Gen: NAD, alert, cooperative with exam HEENT: NCAT, oropharynx moist and clear, TMs normal bilaterally, nares with swollen turbinates bilaterally CV: RRR, good S1/S2, no murmur Resp: Nonlabored, wheezing, left-sided lower lung field with decreased lung sounds and increased rhonchorous and wheezy sounds Ext: No edema, warm Neuro: Alert and oriented, No gross deficits  Assessment and plan:  # Community-acquired pneumonia Given 2 weeks duration, despite doxycycline treatment, he's had persistent worsening shortness of breath area Lung exam significant for changes in the left lower lung field Azithromycin given, with significant wheezing also given IM Depo-Medrol.  # Erectile dysfunction Refilling Revatio  # Hypertension Control only on lisinopril Discontinue amlodipine   Meds ordered this encounter  Medications  . azithromycin (ZITHROMAX) 250 MG tablet    Sig: Take 2 tablets on day 1 and 1 tablet daily after that    Dispense:  6 tablet    Refill:  0  . sildenafil (REVATIO) 20 MG tablet    Sig: Take 2 to 5 pills once daily as needed for ED    Dispense:  50 tablet    Refill:  4    Murtis SinkSam Bradshaw, MD [facility] 06/05/2016, 3:27 PM

## 2016-06-05 NOTE — Patient Instructions (Addendum)
Great to see you!   Community-Acquired Pneumonia, Adult Pneumonia is an infection of the lungs. There are different types of pneumonia. One type can develop while a person is in a hospital. A different type, called community-acquired pneumonia, develops in people who are not, or have not recently been, in the hospital or other health care facility. What are the causes? Pneumonia may be caused by bacteria, viruses, or funguses. Community-acquired pneumonia is often caused by Streptococcus pneumonia bacteria. These bacteria are often passed from one person to another by breathing in droplets from the cough or sneeze of an infected person. What increases the risk? The condition is more likely to develop in:  People who havechronic diseases, such as chronic obstructive pulmonary disease (COPD), asthma, congestive heart failure, cystic fibrosis, diabetes, or kidney disease.  People who haveearly-stage or late-stage HIV.  People who havesickle cell disease.  People who havehad their spleen removed (splenectomy).  People who havepoor dental hygiene.  People who havemedical conditions that increase the risk of breathing in (aspirating) secretions their own mouth and nose.  People who havea weakened immune system (immunocompromised).  People who smoke.  People whotravel to areas where pneumonia-causing germs commonly exist.  People whoare around animal habitats or animals that have pneumonia-causing germs, including birds, bats, rabbits, cats, and farm animals.  What are the signs or symptoms? Symptoms of this condition include:  Adry cough.  A wet (productive) cough.  Fever.  Sweating.  Chest pain, especially when breathing deeply or coughing.  Rapid breathing or difficulty breathing.  Shortness of breath.  Shaking chills.  Fatigue.  Muscle aches.  How is this diagnosed? Your health care provider will take a medical history and perform a physical exam. You may  also have other tests, including:  Imaging studies of your chest, including X-rays.  Tests to check your blood oxygen level and other blood gases.  Other tests on blood, mucus (sputum), fluid around your lungs (pleural fluid), and urine.  If your pneumonia is severe, other tests may be done to identify the specific cause of your illness. How is this treated? The type of treatment that you receive depends on many factors, such as the cause of your pneumonia, the medicines you take, and other medical conditions that you have. For most adults, treatment and recovery from pneumonia may occur at home. In some cases, treatment must happen in a hospital. Treatment may include:  Antibiotic medicines, if the pneumonia was caused by bacteria.  Antiviral medicines, if the pneumonia was caused by a virus.  Medicines that are given by mouth or through an IV tube.  Oxygen.  Respiratory therapy.  Although rare, treating severe pneumonia may include:  Mechanical ventilation. This is done if you are not breathing well on your own and you cannot maintain a safe blood oxygen level.  Thoracentesis. This procedureremoves fluid around one lung or both lungs to help you breathe better.  Follow these instructions at home:  Take over-the-counter and prescription medicines only as told by your health care provider. ? Only takecough medicine if you are losing sleep. Understand that cough medicine can prevent your body's natural ability to remove mucus from your lungs. ? If you were prescribed an antibiotic medicine, take it as told by your health care provider. Do not stop taking the antibiotic even if you start to feel better.  Sleep in a semi-upright position at night. Try sleeping in a reclining chair, or place a few pillows under your head.  Do not   use tobacco products, including cigarettes, chewing tobacco, and e-cigarettes. If you need help quitting, ask your health care provider.  Drink enough  water to keep your urine clear or pale yellow. This will help to thin out mucus secretions in your lungs. How is this prevented? There are ways that you can decrease your risk of developing community-acquired pneumonia. Consider getting a pneumococcal vaccine if:  You are older than 44 years of age.  You are older than 44 years of age and are undergoing cancer treatment, have chronic lung disease, or have other medical conditions that affect your immune system. Ask your health care provider if this applies to you.  There are different types and schedules of pneumococcal vaccines. Ask your health care provider which vaccination option is best for you. You may also prevent community-acquired pneumonia if you take these actions:  Get an influenza vaccine every year. Ask your health care provider which type of influenza vaccine is best for you.  Go to the dentist on a regular basis.  Wash your hands often. Use hand sanitizer if soap and water are not available.  Contact a health care provider if:  You have a fever.  You are losing sleep because you cannot control your cough with cough medicine. Get help right away if:  You have worsening shortness of breath.  You have increased chest pain.  Your sickness becomes worse, especially if you are an older adult or have a weakened immune system.  You cough up blood. This information is not intended to replace advice given to you by your health care provider. Make sure you discuss any questions you have with your health care provider. Document Released: 06/23/2005 Document Revised: 11/01/2015 Document Reviewed: 10/18/2014 Elsevier Interactive Patient Education  2017 [employer]  

## 2016-06-05 NOTE — Addendum Note (Signed)
Addended by: Lorelee CoverOSTOSKY, JESSICA C on: 06/05/2016 03:32 PM   Modules accepted: Orders

## 2016-06-27 ENCOUNTER — Other Ambulatory Visit: Payer: Self-pay | Admitting: Family Medicine

## 2016-07-17 ENCOUNTER — Encounter: Payer: Self-pay | Admitting: Family Medicine

## 2016-07-17 ENCOUNTER — Encounter: Payer: Self-pay | Admitting: Physician Assistant

## 2016-07-17 ENCOUNTER — Ambulatory Visit (INDEPENDENT_AMBULATORY_CARE_PROVIDER_SITE_OTHER): Payer: BLUE CROSS/BLUE SHIELD | Admitting: Family Medicine

## 2016-07-17 VITALS — BP 136/87 | HR 84 | Temp 98.1°F | Ht 75.0 in | Wt 257.0 lb

## 2016-07-17 DIAGNOSIS — R1310 Dysphagia, unspecified: Secondary | ICD-10-CM | POA: Diagnosis not present

## 2016-07-17 DIAGNOSIS — M109 Gout, unspecified: Secondary | ICD-10-CM

## 2016-07-17 DIAGNOSIS — F17200 Nicotine dependence, unspecified, uncomplicated: Secondary | ICD-10-CM | POA: Diagnosis not present

## 2016-07-17 DIAGNOSIS — R6889 Other general symptoms and signs: Secondary | ICD-10-CM | POA: Diagnosis not present

## 2016-07-17 LAB — VERITOR FLU A/B WAIVED
Influenza A: NEGATIVE
Influenza B: NEGATIVE

## 2016-07-17 MED ORDER — COLCHICINE 0.6 MG PO TABS: ORAL_TABLET | ORAL | 1 refills | Status: DC

## 2016-07-17 NOTE — Progress Notes (Signed)
   HPI  Patient presents today here with acute illness, also to discuss dysphagia, and gout.  Flulike illness Patient states for about 48-72 hours he's had body aches, chills, cough, nasal congestion. He denies any shortness of breath. He is tolerating food and fluids normally. He denies any sick contacts.  Gout Patient requests hydrocodone for gout, he states that occasionally he gets flares it helps with the pain. Naprosyn also helps. He has not tried colchicine but has heard that it works.  Dysphagia Patient reports 6+ months of feeling like "food gets hung up". No specific foods can be identified that are difficult to swallow. He states that his father had issues and had to have strictures taking care of. He states he has conservative treatment that they described his father without much improvement.  Tobacco use-still smoking some.  PMH: Smoking status noted ROS: Per HPI  Objective: BP 136/87   Pulse 84   Temp 98.1 F (36.7 C) (Oral)   Ht 6\' 3"  (1.905 m)   Wt 257 lb (116.6 kg)   BMI 32.12 kg/m  Gen: NAD, alert, cooperative with exam HEENT: NCAT, oromucosa moist, TMs normal bilaterally, nares clear CV: RRR, good S1/S2, no murmur Resp: CTABL, no wheezes, non-labored Ext: No edema, warm Neuro: Alert and oriented, No gross deficits  Assessment and plan:  # Flulike symptoms Most likely influenza, however his rapid flu test is negative Written note for work until 7 days after onset of symptoms No signs of bacterial infection  # Dysphagia Persistent symptoms with family history of esophageal problems Refer to GI for their opinion  # Gout No problems currently, declined his request for hydrocodone Trial of colchicine Also discussed NSAIDs Also discussed starting prevention medicines like allopurinol and Uloric if needed  Tobacco use- recommended cessation.     Orders Placed This Encounter  Procedures  . Veritor Flu A/B Waived    Order Specific Question:    Source    Answer:   nose  . Ambulatory referral to Gastroenterology    Referral Priority:   Routine    Referral Type:   Consultation    Referral Reason:   Specialty Services Required    Number of Visits Requested:   1    Meds ordered this encounter  Medications  . colchicine 0.6 MG tablet    Sig: Take 2 pills at first sight of flare, repeat 1 pill 1 hour later, then daily for 4-5 days    Dispense:  30 tablet    Refill:  1    Murtis SinkSam Bradshaw, MD [facility] 07/17/2016, 2:54 PM

## 2016-07-17 NOTE — Patient Instructions (Signed)
Great to see you!   Get plenty of fluids, take tylenol, and limit exposure to others.   Try colchicine for gout when you have flares.    Influenza, Adult Influenza, more commonly known as "the flu," is a viral infection that primarily affects the respiratory tract. The respiratory tract includes organs that help you breathe, such as the lungs, nose, and throat. The flu causes many common cold symptoms, as well as a high fever and body aches. The flu spreads easily from person to person (is contagious). Getting a flu shot (influenza vaccination) every year is the best way to prevent influenza. What are the causes? Influenza is caused by a virus. You can catch the virus by:  Breathing in droplets from an infected person's cough or sneeze.  Touching something that was recently contaminated with the virus and then touching your mouth, nose, or eyes. What increases the risk? The following factors may make you more likely to get the flu:  Not cleaning your hands frequently with soap and water or alcohol-based hand sanitizer.  Having close contact with many people during cold and flu season.  Touching your mouth, eyes, or nose without washing or sanitizing your hands first.  Not drinking enough fluids or not eating a healthy diet.  Not getting enough sleep or exercise.  Being under a high amount of stress.  Not getting a yearly (annual) flu shot. You may be at a higher risk of complications from the flu, such as a severe lung infection (pneumonia), if you:  Are over the age of 45.  Are pregnant.  Have a weakened disease-fighting system (immune system). You may have a weakened immune system if you:  Have HIV or AIDS.  Are undergoing chemotherapy.  Aretaking medicines that reduce the activity of (suppress) the immune system.  Have a long-term (chronic) illness, such as heart disease, kidney disease, diabetes, or lung disease.  Have a liver disorder.  Are obese.  Have  anemia. What are the signs or symptoms? Symptoms of this condition typically last 4-10 days and may include:  Fever.  Chills.  Headache, body aches, or muscle aches.  Sore throat.  Cough.  Runny or congested nose.  Chest discomfort and cough.  Poor appetite.  Weakness or tiredness (fatigue).  Dizziness.  Nausea or vomiting. How is this diagnosed? This condition may be diagnosed based on your medical history and a physical exam. Your health care provider may do a nose or throat swab test to confirm the diagnosis. How is this treated? If influenza is detected early, you can be treated with antiviral medicine that can reduce the length of your illness and the severity of your symptoms. This medicine may be given by mouth (orally) or through an IV tube that is inserted in one of your veins. The goal of treatment is to relieve symptoms by taking care of yourself at home. This may include taking over-the-counter medicines, drinking plenty of fluids, and adding humidity to the air in your home. In some cases, influenza goes away on its own. Severe influenza or complications from influenza may be treated in a hospital. Follow these instructions at home:  Take over-the-counter and prescription medicines only as told by your health care provider.  Use a cool mist humidifier to add humidity to the air in your home. This can make breathing easier.  Rest as needed.  Drink enough fluid to keep your urine clear or pale yellow.  Cover your mouth and nose when you cough or  sneeze.  Wash your hands with soap and water often, especially after you cough or sneeze. If soap and water are not available, use hand sanitizer.  Stay home from work or school as told by your health care provider. Unless you are visiting your health care provider, try to avoid leaving home until your fever has been gone for 24 hours without the use of medicine.  Keep all follow-up visits as told by your health care  provider. This is important. How is this prevented?  Getting an annual flu shot is the best way to avoid getting the flu. You may get the flu shot in late summer, fall, or winter. Ask your health care provider when you should get your flu shot.  Wash your hands often or use hand sanitizer often.  Avoid contact with people who are sick during cold and flu season.  Eat a healthy diet, drink plenty of fluids, get enough sleep, and exercise regularly. Contact a health care provider if:  You develop new symptoms.  You have:  Chest pain.  Diarrhea.  A fever.  Your cough gets worse.  You produce more mucus.  You feel nauseous or you vomit. Get help right away if:  You develop shortness of breath or difficulty breathing.  Your skin or nails turn a bluish color.  You have severe pain or stiffness in your neck.  You develop a sudden headache or sudden pain in your face or ear.  You cannot stop vomiting. This information is not intended to replace advice given to you by your health care provider. Make sure you discuss any questions you have with your health care provider. Document Released: 06/20/2000 Document Revised: 11/29/2015 Document Reviewed: 04/17/2015 Elsevier Interactive Patient Education  2017 [employer].

## 2016-07-25 ENCOUNTER — Ambulatory Visit: Payer: BLUE CROSS/BLUE SHIELD | Admitting: Physician Assistant

## 2016-08-01 ENCOUNTER — Ambulatory Visit (INDEPENDENT_AMBULATORY_CARE_PROVIDER_SITE_OTHER): Payer: BLUE CROSS/BLUE SHIELD | Admitting: Physician Assistant

## 2016-08-01 ENCOUNTER — Encounter: Payer: Self-pay | Admitting: Physician Assistant

## 2016-08-01 VITALS — BP 108/88 | HR 80 | Ht 74.0 in | Wt 258.0 lb

## 2016-08-01 DIAGNOSIS — K219 Gastro-esophageal reflux disease without esophagitis: Secondary | ICD-10-CM | POA: Diagnosis not present

## 2016-08-01 DIAGNOSIS — R1319 Other dysphagia: Secondary | ICD-10-CM

## 2016-08-01 MED ORDER — PANTOPRAZOLE SODIUM 40 MG PO TBEC: 40.0000 mg | DELAYED_RELEASE_TABLET | Freq: Every day | ORAL | 3 refills | Status: DC

## 2016-08-01 NOTE — Progress Notes (Signed)
Subjective:    Patient ID: Chase Christensen, male    DOB: May 25, 1971, 46 y.o.   MRN: [id number]  HPI Chase Christensen is a pleasant 46 year old white male, new to GI today self-referred for complaints of dysphagia. He states he has had intermittent difficulty over the past couple of years but feels that his symptoms have progressed over the past several months. He says he will have trouble 2-3 times per week with an episode of food lodging. Says generally has trouble with meat. Generally if he stops eating and gets up and stretches the food well gradually go on down. He has not had any episodes of regurgitation. He occasionally has heartburn or indigestion but not on a regular basis. His appetite has  been fine ,his weight has been stable. He mentions that he has had some episodes of diarrhea that seemed to occur just after he has had an episode of dysphagia. His bowel movements are otherwise normal she noted any melena or hematochezia.  Family history is pertinent for colon cancer in his father's cousin, no first degree relatives.  Review of Systems Pertinent positive and negative review of systems were noted in the above HPI section.  All other review of systems was otherwise negative.  Outpatient Encounter Prescriptions as of 08/01/2016  Medication Sig  . colchicine 0.6 MG tablet Take 2 pills at first sight of flare, repeat 1 pill 1 hour later, then daily for 4-5 days  . hydrochlorothiazide (MICROZIDE) 12.5 MG capsule TAKE 1 CAPSULE (12.5 MG TOTAL) BY MOUTH DAILY.  Marland Kitchen lisinopril (PRINIVIL,ZESTRIL) 20 MG tablet Take 1 tablet (20 mg total) by mouth daily.  . sildenafil (REVATIO) 20 MG tablet Take 2 to 5 pills once daily as needed for ED  . pantoprazole (PROTONIX) 40 MG tablet Take 1 tablet (40 mg total) by mouth daily before breakfast.   No facility-administered encounter medications on file as of 08/01/2016.    Allergies  Allergen Reactions  . Penicillins Rash   Patient Active Problem List   Diagnosis  Date Noted  . Gout 07/17/2016  . Chest pain 07/13/2015  . Erectile dysfunction 06/08/2015  . Snoring 06/08/2015  . Essential hypertension 05/03/2015  . Fatigue 05/03/2015  . Tobacco use disorder 05/03/2015   Social History   Social History  . Marital status: Legally Separated    Spouse name: N/A  . Number of children: 1  . Years of education: N/A   Occupational History  . servic tech/ [employer]    Social History Main Topics  . Smoking status: Current Every Day Smoker    Packs/day: 0.25    Types: Cigarettes  . Smokeless tobacco: Current User    Types: Chew  . Alcohol use Yes     Comment: occas  . Drug use: No  . Sexual activity: Not on file   Other Topics Concern  . Not on file   Social History Narrative  . No narrative on file    Chase Christensen's family history includes Cancer in his other; Colon cancer in his paternal uncle; Colon polyps in his cousin; Diabetes in his maternal grandmother; Hypertension in his mother; Lung cancer in his maternal grandfather; Pancreatic cancer in his paternal grandmother.      Objective:    Vitals:   08/01/16 0843  BP: 108/88  Pulse: 80    Physical Exam   well-developed white male in no acute distress, pleasant blood pressure 108/88 pulse 80, BMI 33.1. HEENT; nontraumatic normocephalic EOMI PERRLA sclera anicteric, Cardiovascular; regular  rate and rhythm with S1-S2 no murmur or gallop, Pulmonary ;clear bilaterally, Abdomen ;soft nontender nondistended bowel sounds are active there is no palpable mass or hepatosplenomegaly, Rectal ;exam not done, Extremities; no clubbing cyanosis or edema skin warm and dry, Neuropsych; mood and affect appropriate       Assessment & Plan:   #38 46 year old white male with gradually progressive solid food dysphagia. Suspect distal esophageal stricture #2 hypertension  Plan; We'll start Protonix 40 mg by mouth every morning before meals breakfast Patient will be scheduled for EGD with probable  esophageal dilation with Dr. Russella Dar. Procedure discussed in detail with patient including risks and benefits and he is agreeable to proceed. In the interim is advised to be careful with cutting his food and particularly meats into small pieces and chewing carefully. We also discussed colon cancer screening, with colonoscopy at age 46.   Chase Christensen S Vyolet Sakuma PA-C 08/01/2016   Cc: Elenora Gamma, MD

## 2016-08-01 NOTE — Progress Notes (Signed)
Reviewed and agree with management plan.  Trenell Concannon T. Nichoals Heyde, MD FACG 

## 2016-08-01 NOTE — Patient Instructions (Signed)
  You have been scheduled for an endoscopy. Please follow written instructions given to you at your visit today. If you use inhalers (even only as needed), please bring them with you on the day of your procedure. Your physician has requested that you go to www.startemmi.com and enter the access code given to you at your visit today. This web site gives a general overview about your procedure. However, you should still follow specific instructions given to you by our office regarding your preparation for the procedure.   We have sent the following medications to your pharmacy for you to pick up at your convenience: Protonix   You will be due for a colon at age 46.    Today we are giving you a work note.    I appreciate the opportunity to care for you.

## 2016-08-05 ENCOUNTER — Other Ambulatory Visit: Payer: Self-pay

## 2016-08-05 DIAGNOSIS — I10 Essential (primary) hypertension: Secondary | ICD-10-CM

## 2016-08-05 MED ORDER — LISINOPRIL 20 MG PO TABS: 20.0000 mg | ORAL_TABLET | Freq: Every day | ORAL | 0 refills | Status: DC

## 2016-09-25 ENCOUNTER — Telehealth: Payer: Self-pay | Admitting: *Deleted

## 2016-09-25 NOTE — Telephone Encounter (Signed)
Patient states he has been taking Protonix since office visit with Amy,PA and he is no longer having GI symptoms. He states his food is digesting without any difficulty. Patient states he doesn't think he needs the EGD tomorrow, he wants to cancel. Please advise. Thank you, Robbin

## 2016-09-25 NOTE — Telephone Encounter (Signed)
Spoke with patient. Patient states he does want his EGD cancelled tomorrow. Notified patient that he may be charged $100 cancellation fee. He said "ok".

## 2016-09-25 NOTE — Telephone Encounter (Signed)
OK. Charge late cancellation fee.

## 2016-09-26 ENCOUNTER — Encounter: Payer: BLUE CROSS/BLUE SHIELD | Admitting: Gastroenterology

## 2016-10-17 ENCOUNTER — Other Ambulatory Visit: Payer: Self-pay | Admitting: Emergency Medicine

## 2016-10-17 MED ORDER — PANTOPRAZOLE SODIUM 40 MG PO TBEC: 40.0000 mg | DELAYED_RELEASE_TABLET | Freq: Every day | ORAL | 3 refills | Status: DC

## 2016-10-23 ENCOUNTER — Other Ambulatory Visit: Payer: Self-pay | Admitting: Family Medicine

## 2016-10-23 DIAGNOSIS — I10 Essential (primary) hypertension: Secondary | ICD-10-CM

## 2016-10-27 NOTE — Telephone Encounter (Signed)
This was sent to Ursula Beath.

## 2016-11-24 NOTE — Telephone Encounter (Signed)
Pt Billed. °

## 2017-03-08 ENCOUNTER — Other Ambulatory Visit: Payer: Self-pay | Admitting: Family Medicine

## 2017-03-08 DIAGNOSIS — I10 Essential (primary) hypertension: Secondary | ICD-10-CM

## 2017-05-18 ENCOUNTER — Other Ambulatory Visit: Payer: Self-pay | Admitting: Family Medicine

## 2017-05-19 NOTE — Telephone Encounter (Signed)
t seen 07/17/16  Dr Ermalinda MemosBradshaw  If approved route to nurse to call into  [facility]

## 2017-05-28 ENCOUNTER — Other Ambulatory Visit: Payer: Self-pay | Admitting: Family Medicine

## 2017-05-28 DIAGNOSIS — I10 Essential (primary) hypertension: Secondary | ICD-10-CM

## 2017-06-04 ENCOUNTER — Other Ambulatory Visit: Payer: Self-pay | Admitting: *Deleted

## 2017-06-04 DIAGNOSIS — I10 Essential (primary) hypertension: Secondary | ICD-10-CM

## 2017-06-04 MED ORDER — LISINOPRIL 20 MG PO TABS: 20.0000 mg | ORAL_TABLET | Freq: Every day | ORAL | 0 refills | Status: DC

## 2017-06-04 NOTE — Telephone Encounter (Signed)
Refill for 30 day supply was authorized since last OV was Jan 2018 Cancelled refill with mail order and sent to local [facility]

## 2017-06-11 ENCOUNTER — Other Ambulatory Visit: Payer: Self-pay | Admitting: Family Medicine

## 2017-06-11 NOTE — Telephone Encounter (Signed)
Pt notified needs to be seen for further refills

## 2017-07-20 ENCOUNTER — Encounter: Payer: Self-pay | Admitting: Family Medicine

## 2017-07-20 ENCOUNTER — Ambulatory Visit (INDEPENDENT_AMBULATORY_CARE_PROVIDER_SITE_OTHER): Payer: BLUE CROSS/BLUE SHIELD | Admitting: Family Medicine

## 2017-07-20 VITALS — BP 140/103 | HR 77 | Temp 97.0°F | Ht 74.0 in | Wt 267.4 lb

## 2017-07-20 DIAGNOSIS — Z0000 Encounter for general adult medical examination without abnormal findings: Secondary | ICD-10-CM

## 2017-07-20 DIAGNOSIS — R5382 Chronic fatigue, unspecified: Secondary | ICD-10-CM

## 2017-07-20 DIAGNOSIS — I10 Essential (primary) hypertension: Secondary | ICD-10-CM

## 2017-07-20 MED ORDER — LISINOPRIL 20 MG PO TABS: 20.0000 mg | ORAL_TABLET | Freq: Every day | ORAL | 3 refills | Status: DC

## 2017-07-20 MED ORDER — CHLORTHALIDONE 25 MG PO TABS
25.0000 mg | ORAL_TABLET | Freq: Every day | ORAL | 3 refills | Status: DC
Start: 2017-07-20 — End: 2018-01-13

## 2017-07-20 NOTE — Patient Instructions (Signed)
Great to see you!  Come back in about 4-6 weeks to recheck your blood pressure.  Start with chlorthalidone 1/2 tablet once daily, if your bottom number of your blood pressure is not under 90 consistently go ahead and increase to 1 whole tablet after 2 weeks.

## 2017-07-20 NOTE — Progress Notes (Signed)
   HPI  Patient presents today here for annual physical exam as well as follow-up for chronic medical conditions.  Hypertension Good medication compliance, blood pressure is frequently in the 57B diastolic at home. Notes that his blood pressure sometimes causes a mid typical headache stating it happens maybe once every 4-8 weeks. No chest pain.  Patient states that he is very active at work, no exercise routine. He is been very busy at work recently working 12-hour days 6 days a week, he works for a [employer].  He has had significant fatigue for about 1 month.  He has been treated with testosterone in the past. He does have erectile dysfunction as well as sometimes decreased sex drive.  PMH: Smoking status noted ROS: Per HPI  Objective: BP (!) 140/103   Pulse 77   Temp (!) 97 F (36.1 C) (Oral)   Ht _0  (1.88 m)   Wt 267 lb 6.4 oz (121.3 kg)   BMI 34.33 kg/m  Gen: NAD, alert, cooperative with exam HEENT: NCAT, EOMI, PERRL CV: RRR, good S1/S2, no murmur Resp: CTABL, no wheezes, non-labored Abd: SNTND, BS present, no guarding or organomegaly Ext: No edema, warm Neuro: Alert and oriented, No gross deficits  Assessment and plan:  #Annual physical exam Normal exam, BMI is elevated Labs today  #Fatigue Patient with intermittent fatigue, specifically worsening for 1 month. Likely job related stress, however medical reasons such as hypogonadism, hypothyroidism, uncontrolled blood pressure, obesity could all be contributing factors. Labs  #Hypertension Uncontrolled Patient has had significant variance in blood pressure control between summer and winter. We have decided to start him on thiazide diuretic today, he will likely need to stop this in the warmer seasons.  This should be effective for his primarily diastolic hypertension starting at 12.5 mg chlorthalidone, titrate to 25 mg if not well controlled at home in 2 weeks.  Follow-up 4-6 weeks    Orders  Placed This Encounter  Procedures  . CMP14+EGFR  . CBC with Differential/Platelet  . Lipid panel  . TSH  . PSA  . Testosterone    Meds ordered this encounter  Medications  . lisinopril (PRINIVIL,ZESTRIL) 20 MG tablet    Sig: Take 1 tablet (20 mg total) by mouth daily.    Dispense:  90 tablet    Refill:  3  . chlorthalidone (HYGROTON) 25 MG tablet    Sig: Take 1 tablet (25 mg total) by mouth daily.    Dispense:  90 tablet    Refill:  [place], MD [place] 07/20/2017, 11:31 AM

## 2017-07-21 ENCOUNTER — Other Ambulatory Visit: Payer: BLUE CROSS/BLUE SHIELD

## 2017-07-22 LAB — CMP14+EGFR
ALT: 35 IU/L (ref 0–44)
AST: 26 IU/L (ref 0–40)
Albumin/Globulin Ratio: 2.3 — ABNORMAL HIGH (ref 1.2–2.2)
Albumin: 4.3 g/dL (ref 3.5–5.5)
Alkaline Phosphatase: 80 IU/L (ref 39–117)
BILIRUBIN TOTAL: 0.5 mg/dL (ref 0.0–1.2)
BUN / CREAT RATIO: 17 (ref 9–20)
BUN: 19 mg/dL (ref 6–24)
CALCIUM: 9.1 mg/dL (ref 8.7–10.2)
CHLORIDE: 108 mmol/L — AB (ref 96–106)
CO2: 20 mmol/L (ref 20–29)
Creatinine, Ser: 1.09 mg/dL (ref 0.76–1.27)
GFR, EST AFRICAN AMERICAN: 94 mL/min/{1.73_m2} (ref >59)
GFR, EST NON AFRICAN AMERICAN: 82 mL/min/{1.73_m2} (ref >59)
GLUCOSE: 96 mg/dL (ref 65–99)
Globulin, Total: 1.9 g/dL (ref 1.5–4.5)
Potassium: 4.2 mmol/L (ref 3.5–5.2)
Sodium: 144 mmol/L (ref 134–144)
TOTAL PROTEIN: 6.2 g/dL (ref 6.0–8.5)

## 2017-07-22 LAB — CBC WITH DIFFERENTIAL/PLATELET
BASOS ABS: 0 10*3/uL (ref 0.0–0.2)
BASOS: 0 %
EOS (ABSOLUTE): 0.5 10*3/uL — ABNORMAL HIGH (ref 0.0–0.4)
EOS: 6 %
HEMATOCRIT: 47.8 % (ref 37.5–51.0)
HEMOGLOBIN: 17 g/dL (ref 13.0–17.7)
IMMATURE GRANS (ABS): 0.1 10*3/uL (ref 0.0–0.1)
Immature Granulocytes: 1 %
LYMPHS: 21 %
Lymphocytes Absolute: 1.7 10*3/uL (ref 0.7–3.1)
MCH: 32.6 pg (ref 26.6–33.0)
MCHC: 35.6 g/dL (ref 31.5–35.7)
MCV: 92 fL (ref 79–97)
MONOCYTES: 7 %
Monocytes Absolute: 0.6 10*3/uL (ref 0.1–0.9)
NEUTROS ABS: 5.4 10*3/uL (ref 1.4–7.0)
Neutrophils: 65 %
Platelets: 199 10*3/uL (ref 150–379)
RBC: 5.21 x10E6/uL (ref 4.14–5.80)
RDW: 13.2 % (ref 12.3–15.4)
WBC: 8.2 10*3/uL (ref 3.4–10.8)

## 2017-07-22 LAB — LIPID PANEL
CHOL/HDL RATIO: 4.3 ratio (ref 0.0–5.0)
CHOLESTEROL TOTAL: 132 mg/dL (ref 100–199)
HDL: 31 mg/dL — ABNORMAL LOW (ref >39)
LDL CALC: 77 mg/dL (ref 0–99)
Triglycerides: 119 mg/dL (ref 0–149)
VLDL Cholesterol Cal: 24 mg/dL (ref 5–40)

## 2017-07-22 LAB — PSA: PROSTATE SPECIFIC AG, SERUM: 1.5 ng/mL (ref 0.0–4.0)

## 2017-07-22 LAB — TESTOSTERONE: Testosterone: 339 ng/dL (ref 264–916)

## 2017-07-22 LAB — TSH: TSH: 2.28 u[IU]/mL (ref 0.450–4.500)

## 2017-07-29 ENCOUNTER — Telehealth: Payer: Self-pay | Admitting: Family Medicine

## 2017-07-29 NOTE — Telephone Encounter (Signed)
Please advise 

## 2017-07-29 NOTE — Telephone Encounter (Signed)
I would recommend gettinga  Nurse BP check and bring in his home cuff to test it.   This is much higher than previous.   Murtis SinkSam Bradshaw, MD [facility] 07/29/2017, 11:55 AM

## 2017-07-29 NOTE — Telephone Encounter (Signed)
Pt notified of recommendation Will come in for BP check

## 2017-07-30 ENCOUNTER — Ambulatory Visit: Payer: BLUE CROSS/BLUE SHIELD | Admitting: Nurse Practitioner

## 2017-07-30 ENCOUNTER — Encounter: Payer: Self-pay | Admitting: Nurse Practitioner

## 2017-07-30 VITALS — BP 168/114 | HR 70 | Temp 98.2°F | Ht 75.0 in | Wt 268.0 lb

## 2017-07-30 DIAGNOSIS — I10 Essential (primary) hypertension: Secondary | ICD-10-CM

## 2017-07-30 DIAGNOSIS — R51 Headache: Secondary | ICD-10-CM

## 2017-07-30 DIAGNOSIS — R519 Headache, unspecified: Secondary | ICD-10-CM

## 2017-07-30 MED ORDER — KETOROLAC TROMETHAMINE 60 MG/2ML IM SOLN
60.0000 mg | Freq: Once | INTRAMUSCULAR | Status: AC
  Administered 2017-07-30: 60 mg via INTRAMUSCULAR

## 2017-07-30 NOTE — Progress Notes (Signed)
Subjective:    Patient ID: Chase Christensen, male    DOB: 09/02/1970, 47 y.o.   MRN: [id number]  HPI Patient comes in today c/o elevated blood pressure. Has been running 150-180 systolic for the last week. He saw DR. Bradshaw on 07/20/17 for CPE and was started on lisinopril 20mg  but has only been taking 1/2 tablet and chlorthalidone 25mg  daily. He has had bad headaches the last several weeks as well.    Review of Systems  Constitutional: Negative for activity change, appetite change, chills and fever.  HENT: Negative.   Eyes: Negative for visual disturbance.  Respiratory: Negative for shortness of breath.   Cardiovascular: Negative.   Gastrointestinal: Negative.   Neurological: Positive for dizziness (one episode yesterday) and headaches.  Psychiatric/Behavioral: Negative.   All other systems reviewed and are negative.      Objective:   Physical Exam  Constitutional: He is oriented to person, place, and time. He appears well-developed and well-nourished.  Cardiovascular: Normal rate, regular rhythm and normal heart sounds.  Pulmonary/Chest: Effort normal and breath sounds normal.  Neurological: He is alert and oriented to person, place, and time.  Skin: Skin is warm.  Psychiatric: He has a normal mood and affect. His behavior is normal. Judgment and thought content normal.   BP (!) 168/114   Pulse 70   Temp 98.2 F (36.8 C) (Oral)   Ht 6\' 3"  (1.905 m)   Wt 268 lb (121.6 kg)   BMI 33.50 kg/m      Assessment & Plan:  1. Essential hypertension Go home and take othe rhalf of lisinopril- continue 1 tablet daily from here on out Keep check of blood pressure at home.  2. Acute nonintractable headache, unspecified headache type Rest in dark - ketorolac (TORADOL) injection 60 mg  Mary-Margaret Daphine Deutscher, FNP

## 2017-07-30 NOTE — Patient Instructions (Signed)
DASH Eating Plan DASH stands for "Dietary Approaches to Stop Hypertension." The DASH eating plan is a healthy eating plan that has been shown to reduce high blood pressure (hypertension). It may also reduce your risk for type 2 diabetes, heart disease, and stroke. The DASH eating plan may also help with weight loss. What are tips for following this plan? General guidelines  Avoid eating more than 2,300 mg (milligrams) of salt (sodium) a day. If you have hypertension, you may need to reduce your sodium intake to 1,500 mg a day.  Limit alcohol intake to no more than 1 drink a day for nonpregnant women and 2 drinks a day for men. One drink equals 12 oz of beer, 5 oz of wine, or 1 oz of hard liquor.  Work with your health care provider to maintain a healthy body weight or to lose weight. Ask what an ideal weight is for you.  Get at least 30 minutes of exercise that causes your heart to beat faster (aerobic exercise) most days of the week. Activities may include walking, swimming, or biking.  Work with your health care provider or diet and nutrition specialist (dietitian) to adjust your eating plan to your individual calorie needs. Reading food labels  Check food labels for the amount of sodium per serving. Choose foods with less than 5 percent of the Daily Value of sodium. Generally, foods with less than 300 mg of sodium per serving fit into this eating plan.  To find whole grains, look for the word "whole" as the first word in the ingredient list. Shopping  Buy products labeled as "low-sodium" or "no salt added."  Buy fresh foods. Avoid canned foods and premade or frozen meals. Cooking  Avoid adding salt when cooking. Use salt-free seasonings or herbs instead of table salt or sea salt. Check with your health care provider or pharmacist before using salt substitutes.  Do not fry foods. Cook foods using healthy methods such as baking, boiling, grilling, and broiling instead.  Cook with  heart-healthy oils, such as olive, canola, soybean, or sunflower oil. Meal planning   Eat a balanced diet that includes: ? 5 or more servings of fruits and vegetables each day. At each meal, try to fill half of your plate with fruits and vegetables. ? Up to 6-8 servings of whole grains each day. ? Less than 6 oz of lean meat, poultry, or fish each day. A 3-oz serving of meat is about the same size as a deck of cards. One egg equals 1 oz. ? 2 servings of low-fat dairy each day. ? A serving of nuts, seeds, or beans 5 times each week. ? Heart-healthy fats. Healthy fats called Omega-3 fatty acids are found in foods such as flaxseeds and coldwater fish, like sardines, salmon, and mackerel.  Limit how much you eat of the following: ? Canned or prepackaged foods. ? Food that is high in trans fat, such as fried foods. ? Food that is high in saturated fat, such as fatty meat. ? Sweets, desserts, sugary drinks, and other foods with added sugar. ? Full-fat dairy products.  Do not salt foods before eating.  Try to eat at least 2 vegetarian meals each week.  Eat more home-cooked food and less restaurant, buffet, and fast food.  When eating at a restaurant, ask that your food be prepared with less salt or no salt, if possible. What foods are recommended? The items listed may not be a complete list. Talk with your dietitian about what   dietary choices are best for you. Grains Whole-grain or whole-wheat bread. Whole-grain or whole-wheat pasta. Brown rice. Oatmeal. Quinoa. Bulgur. Whole-grain and low-sodium cereals. Pita bread. Low-fat, low-sodium crackers. Whole-wheat flour tortillas. Vegetables Fresh or frozen vegetables (raw, steamed, roasted, or grilled). Low-sodium or reduced-sodium tomato and vegetable juice. Low-sodium or reduced-sodium tomato sauce and tomato paste. Low-sodium or reduced-sodium canned vegetables. Fruits All fresh, dried, or frozen fruit. Canned fruit in natural juice (without  added sugar). Meat and other protein foods Skinless chicken or turkey. Ground chicken or turkey. Pork with fat trimmed off. Fish and seafood. Egg whites. Dried beans, peas, or lentils. Unsalted nuts, nut butters, and seeds. Unsalted canned beans. Lean cuts of beef with fat trimmed off. Low-sodium, lean deli meat. Dairy Low-fat (1%) or fat-free (skim) milk. Fat-free, low-fat, or reduced-fat cheeses. Nonfat, low-sodium ricotta or cottage cheese. Low-fat or nonfat yogurt. Low-fat, low-sodium cheese. Fats and oils Soft margarine without trans fats. Vegetable oil. Low-fat, reduced-fat, or light mayonnaise and salad dressings (reduced-sodium). Canola, safflower, olive, soybean, and sunflower oils. Avocado. Seasoning and other foods Herbs. Spices. Seasoning mixes without salt. Unsalted popcorn and pretzels. Fat-free sweets. What foods are not recommended? The items listed may not be a complete list. Talk with your dietitian about what dietary choices are best for you. Grains Baked goods made with fat, such as croissants, muffins, or some breads. Dry pasta or rice meal packs. Vegetables Creamed or fried vegetables. Vegetables in a cheese sauce. Regular canned vegetables (not low-sodium or reduced-sodium). Regular canned tomato sauce and paste (not low-sodium or reduced-sodium). Regular tomato and vegetable juice (not low-sodium or reduced-sodium). Pickles. Olives. Fruits Canned fruit in a light or heavy syrup. Fried fruit. Fruit in cream or butter sauce. Meat and other protein foods Fatty cuts of meat. Ribs. Fried meat. Bacon. Sausage. Bologna and other processed lunch meats. Salami. Fatback. Hotdogs. Bratwurst. Salted nuts and seeds. Canned beans with added salt. Canned or smoked fish. Whole eggs or egg yolks. Chicken or turkey with skin. Dairy Whole or 2% milk, cream, and half-and-half. Whole or full-fat cream cheese. Whole-fat or sweetened yogurt. Full-fat cheese. Nondairy creamers. Whipped toppings.  Processed cheese and cheese spreads. Fats and oils Butter. Stick margarine. Lard. Shortening. Ghee. Bacon fat. Tropical oils, such as coconut, palm kernel, or palm oil. Seasoning and other foods Salted popcorn and pretzels. Onion salt, garlic salt, seasoned salt, table salt, and sea salt. Worcestershire sauce. Tartar sauce. Barbecue sauce. Teriyaki sauce. Soy sauce, including reduced-sodium. Steak sauce. Canned and packaged gravies. Fish sauce. Oyster sauce. Cocktail sauce. Horseradish that you find on the shelf. Ketchup. Mustard. Meat flavorings and tenderizers. Bouillon cubes. Hot sauce and Tabasco sauce. Premade or packaged marinades. Premade or packaged taco seasonings. Relishes. Regular salad dressings. Where to find more information:  National Heart, Lung, and Blood Institute: www.nhlbi.nih.gov  American Heart Association: www.heart.org Summary  The DASH eating plan is a healthy eating plan that has been shown to reduce high blood pressure (hypertension). It may also reduce your risk for type 2 diabetes, heart disease, and stroke.  With the DASH eating plan, you should limit salt (sodium) intake to 2,300 mg a day. If you have hypertension, you may need to reduce your sodium intake to 1,500 mg a day.  When on the DASH eating plan, aim to eat more fresh fruits and vegetables, whole grains, lean proteins, low-fat dairy, and heart-healthy fats.  Work with your health care provider or diet and nutrition specialist (dietitian) to adjust your eating plan to your individual   calorie needs. This information is not intended to replace advice given to you by your health care provider. Make sure you discuss any questions you have with your health care provider. Document Released: 06/12/2011 Document Revised: 06/16/2016 Document Reviewed: 06/16/2016 Elsevier Interactive Patient Education  2018 Elsevier Inc.  

## 2017-08-06 ENCOUNTER — Other Ambulatory Visit: Payer: Self-pay | Admitting: Family Medicine

## 2017-08-06 ENCOUNTER — Encounter: Payer: Self-pay | Admitting: Physician Assistant

## 2017-08-06 ENCOUNTER — Ambulatory Visit: Payer: BLUE CROSS/BLUE SHIELD | Admitting: Physician Assistant

## 2017-08-06 VITALS — BP 107/79 | HR 101 | Temp 97.1°F | Ht 75.0 in | Wt 266.0 lb

## 2017-08-06 DIAGNOSIS — J111 Influenza due to unidentified influenza virus with other respiratory manifestations: Secondary | ICD-10-CM

## 2017-08-06 DIAGNOSIS — I10 Essential (primary) hypertension: Secondary | ICD-10-CM

## 2017-08-06 DIAGNOSIS — J189 Pneumonia, unspecified organism: Secondary | ICD-10-CM

## 2017-08-06 DIAGNOSIS — J181 Lobar pneumonia, unspecified organism: Secondary | ICD-10-CM

## 2017-08-06 LAB — VERITOR FLU A/B WAIVED
INFLUENZA B: NEGATIVE
Influenza A: NEGATIVE

## 2017-08-06 MED ORDER — HYDROCODONE-HOMATROPINE 5-1.5 MG/5ML PO SYRP: 5.0000 mL | ORAL_SOLUTION | Freq: Four times a day (QID) | ORAL | 0 refills | Status: DC | PRN

## 2017-08-06 MED ORDER — ALBUTEROL SULFATE HFA 108 (90 BASE) MCG/ACT IN AERS: 2.0000 | INHALATION_SPRAY | Freq: Four times a day (QID) | RESPIRATORY_TRACT | 0 refills | Status: DC | PRN

## 2017-08-06 MED ORDER — METHYLPREDNISOLONE ACETATE 80 MG/ML IJ SUSP: 80.0000 mg | Freq: Once | INTRAMUSCULAR | Status: DC

## 2017-08-06 MED ORDER — CEFDINIR 300 MG PO CAPS
300.0000 mg | ORAL_CAPSULE | Freq: Two times a day (BID) | ORAL | 0 refills | Status: DC
Start: 2017-08-06 — End: 2017-08-27

## 2017-08-06 MED ORDER — OSELTAMIVIR PHOSPHATE 75 MG PO CAPS: 75.0000 mg | ORAL_CAPSULE | Freq: Two times a day (BID) | ORAL | 0 refills | Status: DC

## 2017-08-06 NOTE — Patient Instructions (Signed)
In a few days you may receive a survey in the mail or online from [employer] regarding your visit with us today. Please take a moment to fill this out. Your feedback is very important to our whole office. It can help us better understand your needs as well as improve your experience and satisfaction. Thank you for taking your time to complete it. We care about you.  Leiloni Smithers, PA-C  

## 2017-08-06 NOTE — Progress Notes (Signed)
BP 107/79   Pulse (!) 101   Temp (!) 97.1 F (36.2 C) (Oral)   Ht 6\' 3"  (1.905 m)   Wt 266 lb (120.7 kg)   BMI 33.25 kg/m    Subjective:    Patient ID: Chase Christensen, male    DOB: [birth year], 47 y.o.   MRN: [id number]  HPI: Chase Christensen is a 47 y.o. male presenting on 08/06/2017 for Generalized Body Aches; Fever; and Cough  Patient with several days of progressing upper respiratory and bronchial symptoms. Initially there was more upper respiratory congestion. This progressed to having significant cough that is productive throughout the day and severe at night. There is occasional wheezing after coughing. Sometimes there is slight dyspnea on exertion. It is productive mucus that is yellow in color. Denies any blood. This patient has had less than 2 days severe fever, chills, myalgias.  Complains of sinus headache and postnasal drainage. There is copious drainage at times. Associated sore throat, decreased appetite and headache.  Has been exposed to influenza.    Relevant past medical, surgical, family and social history reviewed and updated as indicated. Allergies and medications reviewed and updated.  Past Medical History:  Diagnosis Date  . COPD (chronic obstructive pulmonary disease) (HCC)   . ED (erectile dysfunction)   . Gout   . Hypertension   . Pneumonia   . Sleep apnea with use of continuous positive airway pressure (CPAP)     Past Surgical History:  Procedure Laterality Date  . KNEE ARTHROSCOPY Right 03/15/2015  . WISDOM TOOTH EXTRACTION      Review of Systems  Constitutional: Positive for activity change, chills, fatigue and fever. Negative for appetite change.  HENT: Positive for congestion and sore throat. Negative for sinus pressure.   Eyes: Negative.  Negative for pain and visual disturbance.  Respiratory: Positive for cough and wheezing. Negative for chest tightness and shortness of breath.   Cardiovascular: Negative.  Negative for chest pain, palpitations  and leg swelling.  Gastrointestinal: Positive for nausea. Negative for abdominal pain, diarrhea and vomiting.  Endocrine: Negative.   Genitourinary: Negative.   Musculoskeletal: Positive for back pain and myalgias. Negative for arthralgias.  Skin: Negative.  Negative for color change and rash.  Neurological: Positive for headaches. Negative for weakness and numbness.  Psychiatric/Behavioral: Negative.     Allergies as of 08/06/2017      Reactions   Penicillins Rash      Medication List        Accurate as of 08/06/17  5:37 PM. Always use your most recent med list.          albuterol 108 (90 Base) MCG/ACT inhaler Commonly known as:  PROVENTIL HFA;VENTOLIN HFA Inhale 2 puffs into the lungs every 6 (six) hours as needed for wheezing or shortness of breath.   cefdinir 300 MG capsule Commonly known as:  OMNICEF Take 1 capsule (300 mg total) by mouth 2 (two) times daily. 1 po BID   chlorthalidone 25 MG tablet Commonly known as:  HYGROTON Take 1 tablet (25 mg total) by mouth daily.   colchicine 0.6 MG tablet Take 2 pills at first sight of flare, repeat 1 pill 1 hour later, then daily for 4-5 days   HYDROcodone-homatropine 5-1.5 MG/5ML syrup Commonly known as:  HYCODAN Take 5-10 mLs by mouth every 6 (six) hours as needed.   lisinopril 20 MG tablet Commonly known as:  PRINIVIL,ZESTRIL Take 1 tablet (20 mg total) by mouth daily.   oseltamivir 75  MG capsule Commonly known as:  TAMIFLU Take 1 capsule (75 mg total) by mouth 2 (two) times daily.   pantoprazole 40 MG tablet Commonly known as:  PROTONIX Take 1 tablet (40 mg total) by mouth daily before breakfast.   sildenafil 20 MG tablet Commonly known as:  REVATIO TAKE 2-5 TABLETS AS NEEDED PRIOR TO SEXUAL ACTIVITY          Objective:    BP 107/79   Pulse (!) 101   Temp (!) 97.1 F (36.2 C) (Oral)   Ht 6\' 3"  (1.905 m)   Wt 266 lb (120.7 kg)   BMI 33.25 kg/m   Allergies  Allergen Reactions  . Penicillins Rash      Physical Exam  Constitutional: He is oriented to person, place, and time. He appears well-developed and well-nourished. He appears distressed.  HENT:  Head: Normocephalic and atraumatic.  Right Ear: Tympanic membrane normal. No drainage. No middle ear effusion.  Left Ear: Tympanic membrane normal. No drainage.  No middle ear effusion.  Nose: Mucosal edema and rhinorrhea present. Right sinus exhibits no maxillary sinus tenderness. Left sinus exhibits no maxillary sinus tenderness.  Mouth/Throat: Uvula is midline. Posterior oropharyngeal erythema present. No oropharyngeal exudate.  Eyes: Conjunctivae and EOM are normal. Pupils are equal, round, and reactive to light. Right eye exhibits no discharge. Left eye exhibits no discharge.  Neck: Normal range of motion.  Cardiovascular: Normal rate, regular rhythm and normal heart sounds.  Pulmonary/Chest: Effort normal. No respiratory distress. He has decreased breath sounds in the right middle field and the right lower field. He has wheezes.      Abdominal: Soft.  Lymphadenopathy:    He has no cervical adenopathy.  Neurological: He is alert and oriented to person, place, and time.  Skin: Skin is warm and dry.  Psychiatric: He has a normal mood and affect. His behavior is normal.  Nursing note and vitals reviewed.   Results for orders placed or performed in visit on 08/06/17  Veritor Flu A/B Waived  Result Value Ref Range   Influenza A Negative Negative   Influenza B Negative Negative      Assessment & Plan:   1. Pneumonia of right lower lobe due to infectious organism (HCC) - albuterol (PROVENTIL HFA;VENTOLIN HFA) 108 (90 Base) MCG/ACT inhaler; Inhale 2 puffs into the lungs every 6 (six) hours as needed for wheezing or shortness of breath.  Dispense: 1 Inhaler; Refill: 0 - HYDROcodone-homatropine (HYCODAN) 5-1.5 MG/5ML syrup; Take 5-10 mLs by mouth every 6 (six) hours as needed.  Dispense: 240 mL; Refill: 0 - cefdinir (OMNICEF) 300  MG capsule; Take 1 capsule (300 mg total) by mouth 2 (two) times daily. 1 po BID  Dispense: 20 capsule; Refill: 0 - methylPREDNISolone acetate (DEPO-MEDROL) injection 80 mg  2. Influenza - oseltamivir (TAMIFLU) 75 MG capsule; Take 1 capsule (75 mg total) by mouth 2 (two) times daily.  Dispense: 10 capsule; Refill: 0 - Veritor Flu A/B Waived     Current Outpatient Medications:  .  albuterol (PROVENTIL HFA;VENTOLIN HFA) 108 (90 Base) MCG/ACT inhaler, Inhale 2 puffs into the lungs every 6 (six) hours as needed for wheezing or shortness of breath., Disp: 1 Inhaler, Rfl: 0 .  cefdinir (OMNICEF) 300 MG capsule, Take 1 capsule (300 mg total) by mouth 2 (two) times daily. 1 po BID, Disp: 20 capsule, Rfl: 0 .  chlorthalidone (HYGROTON) 25 MG tablet, Take 1 tablet (25 mg total) by mouth daily., Disp: 90 tablet, Rfl: 3 .  colchicine 0.6 MG tablet, Take 2 pills at first sight of flare, repeat 1 pill 1 hour later, then daily for 4-5 days, Disp: 30 tablet, Rfl: 1 .  HYDROcodone-homatropine (HYCODAN) 5-1.5 MG/5ML syrup, Take 5-10 mLs by mouth every 6 (six) hours as needed., Disp: 240 mL, Rfl: 0 .  lisinopril (PRINIVIL,ZESTRIL) 20 MG tablet, Take 1 tablet (20 mg total) by mouth daily., Disp: 90 tablet, Rfl: 3 .  oseltamivir (TAMIFLU) 75 MG capsule, Take 1 capsule (75 mg total) by mouth 2 (two) times daily., Disp: 10 capsule, Rfl: 0 .  pantoprazole (PROTONIX) 40 MG tablet, Take 1 tablet (40 mg total) by mouth daily before breakfast., Disp: 90 tablet, Rfl: 3 .  sildenafil (REVATIO) 20 MG tablet, TAKE 2-5 TABLETS AS NEEDED PRIOR TO SEXUAL ACTIVITY, Disp: 50 tablet, Rfl: 0  Current Facility-Administered Medications:  .  methylPREDNISolone acetate (DEPO-MEDROL) injection 80 mg, 80 mg, Intramuscular, Once, Garrett Mitchum S, PA-C Continue all other maintenance medications as listed above.  Follow up plan: Return if symptoms worsen or fail to improve.  Educational handout given for survey  Remus Loffler  PA-C [facility] [address]  [place], [place] [address] [phone]   08/06/2017, 5:37 PM

## 2017-08-11 ENCOUNTER — Telehealth: Payer: Self-pay | Admitting: *Deleted

## 2017-08-11 ENCOUNTER — Other Ambulatory Visit: Payer: BLUE CROSS/BLUE SHIELD

## 2017-08-11 ENCOUNTER — Other Ambulatory Visit: Payer: Self-pay | Admitting: Physician Assistant

## 2017-08-11 DIAGNOSIS — M791 Myalgia, unspecified site: Secondary | ICD-10-CM

## 2017-08-11 DIAGNOSIS — R82998 Other abnormal findings in urine: Secondary | ICD-10-CM

## 2017-08-11 NOTE — Telephone Encounter (Signed)
Incoming call from pt stating he is having severe muscle cramps and decreased urination Pt also reports urine is dark orange Pt denies fever Currently taking Omnicef and Tamiflu Pt reports drinking at least 1 gallon of water daily Please review and advise

## 2017-08-11 NOTE — Telephone Encounter (Signed)
Pt notified of recommendation Verbalizes understanding Will come in for labs today appt scheduled for tomorrow Work note to front for pt pick up

## 2017-08-11 NOTE — Telephone Encounter (Signed)
Please have him stop the Omnicef and Tamiflu.  Please have him stop colchicine if he has been taking it lately.  I am going to place lab order for CBC, comprehensive metabolic, phosphorus, microalbumin, urinalysis and micro analysis, uric acid, creatinine kinase. There is evidence that rhabdomyolysis can occur after viral infection including influenza, Epstein-Barr virus, and several others.  It also can happen after alcohol, caffeine avoid any movement such as creatinine and ephedra.  I would like for him to have the blood work done today to be seen tomorrow.  If anything gets suddenly worse to go to the emergency room.  If he is not showing any other signs of severe dehydration that we did not have to push fluids at this time.  Do not want any other physical damage than absolutely necassary.  He can have a work note to be out the rest of the week.

## 2017-08-12 ENCOUNTER — Ambulatory Visit: Payer: BLUE CROSS/BLUE SHIELD | Admitting: Physician Assistant

## 2017-08-12 ENCOUNTER — Encounter: Payer: Self-pay | Admitting: Physician Assistant

## 2017-08-12 ENCOUNTER — Other Ambulatory Visit: Payer: Self-pay | Admitting: Physician Assistant

## 2017-08-12 VITALS — BP 124/87 | HR 90 | Temp 97.7°F | Ht 75.0 in | Wt 252.0 lb

## 2017-08-12 DIAGNOSIS — M6282 Rhabdomyolysis: Secondary | ICD-10-CM

## 2017-08-12 DIAGNOSIS — M791 Myalgia, unspecified site: Secondary | ICD-10-CM | POA: Diagnosis not present

## 2017-08-12 LAB — URINALYSIS, COMPLETE
BILIRUBIN UA: NEGATIVE
Glucose, UA: NEGATIVE
Ketones, UA: NEGATIVE
LEUKOCYTES UA: NEGATIVE
Nitrite, UA: NEGATIVE
PH UA: 5 (ref 5.0–7.5)
PROTEIN UA: NEGATIVE
RBC UA: NEGATIVE
Specific Gravity, UA: 1.02 (ref 1.005–1.030)
Urobilinogen, Ur: 0.2 mg/dL (ref 0.2–1.0)

## 2017-08-12 LAB — CBC WITH DIFFERENTIAL/PLATELET
BASOS ABS: 0 10*3/uL (ref 0.0–0.2)
BASOS: 0 %
EOS (ABSOLUTE): 0.4 10*3/uL (ref 0.0–0.4)
Eos: 4 %
Hematocrit: 47.5 % (ref 37.5–51.0)
Hemoglobin: 16.9 g/dL (ref 13.0–17.7)
Immature Grans (Abs): 0 10*3/uL (ref 0.0–0.1)
Immature Granulocytes: 0 %
LYMPHS ABS: 2.6 10*3/uL (ref 0.7–3.1)
Lymphs: 24 %
MCH: 33.5 pg — AB (ref 26.6–33.0)
MCHC: 35.6 g/dL (ref 31.5–35.7)
MCV: 94 fL (ref 79–97)
MONOS ABS: 0.8 10*3/uL (ref 0.1–0.9)
Monocytes: 7 %
NEUTROS ABS: 7 10*3/uL (ref 1.4–7.0)
Neutrophils: 65 %
PLATELETS: 244 10*3/uL (ref 150–379)
RBC: 5.05 x10E6/uL (ref 4.14–5.80)
RDW: 12.8 % (ref 12.3–15.4)
WBC: 10.9 10*3/uL — ABNORMAL HIGH (ref 3.4–10.8)

## 2017-08-12 LAB — PHOSPHORUS: PHOSPHORUS: 4.5 mg/dL (ref 2.5–4.5)

## 2017-08-12 LAB — CMP14+EGFR
A/G RATIO: 1.7 (ref 1.2–2.2)
ALBUMIN: 4.3 g/dL (ref 3.5–5.5)
ALT: 36 IU/L (ref 0–44)
AST: 30 IU/L (ref 0–40)
Alkaline Phosphatase: 79 IU/L (ref 39–117)
BILIRUBIN TOTAL: 0.3 mg/dL (ref 0.0–1.2)
BUN / CREAT RATIO: 23 — AB (ref 9–20)
BUN: 43 mg/dL — ABNORMAL HIGH (ref 6–24)
CHLORIDE: 100 mmol/L (ref 96–106)
CO2: 20 mmol/L (ref 20–29)
Calcium: 9.4 mg/dL (ref 8.7–10.2)
Creatinine, Ser: 1.84 mg/dL — ABNORMAL HIGH (ref 0.76–1.27)
GFR calc non Af Amer: 43 mL/min/{1.73_m2} — ABNORMAL LOW (ref >59)
GFR, EST AFRICAN AMERICAN: 50 mL/min/{1.73_m2} — AB (ref >59)
GLOBULIN, TOTAL: 2.6 g/dL (ref 1.5–4.5)
Glucose: 142 mg/dL — ABNORMAL HIGH (ref 65–99)
POTASSIUM: 4.3 mmol/L (ref 3.5–5.2)
SODIUM: 138 mmol/L (ref 134–144)
TOTAL PROTEIN: 6.9 g/dL (ref 6.0–8.5)

## 2017-08-12 LAB — MICROSCOPIC EXAMINATION
Renal Epithel, UA: NONE SEEN /hpf
WBC, UA: NONE SEEN /hpf (ref 0–?)

## 2017-08-12 LAB — URIC ACID: Uric Acid: 11.8 mg/dL — ABNORMAL HIGH (ref 3.7–8.6)

## 2017-08-12 MED ORDER — DOXYCYCLINE HYCLATE 100 MG PO TABS: 100.0000 mg | ORAL_TABLET | Freq: Two times a day (BID) | ORAL | 0 refills | Status: DC

## 2017-08-13 ENCOUNTER — Emergency Department (HOSPITAL_COMMUNITY): Payer: BLUE CROSS/BLUE SHIELD

## 2017-08-13 ENCOUNTER — Telehealth: Payer: Self-pay | Admitting: *Deleted

## 2017-08-13 ENCOUNTER — Emergency Department (HOSPITAL_COMMUNITY)
Admission: EM | Admit: 2017-08-13 | Discharge: 2017-08-14 | Disposition: A | Discharge status: Home / Self Care | Payer: BLUE CROSS/BLUE SHIELD | Attending: Emergency Medicine | Admitting: Emergency Medicine

## 2017-08-13 ENCOUNTER — Other Ambulatory Visit: Payer: Self-pay

## 2017-08-13 DIAGNOSIS — Z79899 Other long term (current) drug therapy: Secondary | ICD-10-CM | POA: Diagnosis not present

## 2017-08-13 DIAGNOSIS — F17210 Nicotine dependence, cigarettes, uncomplicated: Secondary | ICD-10-CM | POA: Insufficient documentation

## 2017-08-13 DIAGNOSIS — J449 Chronic obstructive pulmonary disease, unspecified: Secondary | ICD-10-CM | POA: Insufficient documentation

## 2017-08-13 DIAGNOSIS — I10 Essential (primary) hypertension: Secondary | ICD-10-CM | POA: Insufficient documentation

## 2017-08-13 DIAGNOSIS — R1030 Lower abdominal pain, unspecified: Secondary | ICD-10-CM | POA: Diagnosis present

## 2017-08-13 DIAGNOSIS — A09 Infectious gastroenteritis and colitis, unspecified: Secondary | ICD-10-CM

## 2017-08-13 LAB — URINALYSIS, ROUTINE W REFLEX MICROSCOPIC
BILIRUBIN URINE: NEGATIVE
Glucose, UA: NEGATIVE mg/dL
HGB URINE DIPSTICK: NEGATIVE
KETONES UR: NEGATIVE mg/dL
Leukocytes, UA: NEGATIVE
NITRITE: NEGATIVE
PROTEIN: NEGATIVE mg/dL
SPECIFIC GRAVITY, URINE: 1.021 (ref 1.005–1.030)
pH: 5 (ref 5.0–8.0)

## 2017-08-13 LAB — CBC
HCT: 50.3 % (ref 39.0–52.0)
HEMOGLOBIN: 17.4 g/dL — AB (ref 13.0–17.0)
MCH: 31.1 pg (ref 26.0–34.0)
MCHC: 34.6 g/dL (ref 30.0–36.0)
MCV: 90 fL (ref 78.0–100.0)
PLATELETS: 291 10*3/uL (ref 150–400)
RBC: 5.59 MIL/uL (ref 4.22–5.81)
RDW: 12 % (ref 11.5–15.5)
WBC: 12.3 10*3/uL — ABNORMAL HIGH (ref 4.0–10.5)

## 2017-08-13 LAB — COMPREHENSIVE METABOLIC PANEL
ALK PHOS: 73 U/L (ref 38–126)
ALT: 36 U/L (ref 17–63)
ANION GAP: 12 (ref 5–15)
AST: 30 U/L (ref 15–41)
Albumin: 4.4 g/dL (ref 3.5–5.0)
BILIRUBIN TOTAL: 0.8 mg/dL (ref 0.3–1.2)
BUN: 36 mg/dL — ABNORMAL HIGH (ref 6–20)
CALCIUM: 9.8 mg/dL (ref 8.9–10.3)
CO2: 20 mmol/L — AB (ref 22–32)
CREATININE: 1.37 mg/dL — AB (ref 0.61–1.24)
Chloride: 105 mmol/L (ref 101–111)
GFR calc non Af Amer: >60 mL/min (ref >60)
GLUCOSE: 111 mg/dL — AB (ref 65–99)
Potassium: 4.7 mmol/L (ref 3.5–5.1)
Sodium: 137 mmol/L (ref 135–145)
TOTAL PROTEIN: 7.9 g/dL (ref 6.5–8.1)

## 2017-08-13 LAB — SPECIMEN STATUS REPORT

## 2017-08-13 LAB — MICROALBUMIN / CREATININE URINE RATIO
Creatinine, Urine: 165.6 mg/dL
Microalb/Creat Ratio: 8.1 mg/g creat (ref 0.0–30.0)
Microalbumin, Urine: 13.4 ug/mL

## 2017-08-13 LAB — CK: CK TOTAL: 413 U/L — AB (ref 24–204)

## 2017-08-13 LAB — LIPASE, BLOOD: Lipase: 42 U/L (ref 11–51)

## 2017-08-13 MED ORDER — SODIUM CHLORIDE 0.9 % IV BOLUS (SEPSIS)
1000.0000 mL | Freq: Once | INTRAVENOUS | Status: AC
  Administered 2017-08-13: 1000 mL via INTRAVENOUS

## 2017-08-13 NOTE — ED Triage Notes (Signed)
Pt was treated for pneumonia 1/31, rechecked at PCP due to Myalgia, then sent pt here for further evaluation. Pt now having abdominal pain, diarrhea, feeling tired, His antibiotic was changed yesterday

## 2017-08-13 NOTE — ED Notes (Signed)
Reminded pt we needed a urine sample 

## 2017-08-13 NOTE — Telephone Encounter (Signed)
Spoke with pt regarding symptoms Today pt is having severe abdominal pain with diarrhea Denies fever or headache Per Dr Ermalinda MemosBradshaw pt needs to go to ER Pt verbalizes understanding

## 2017-08-13 NOTE — Progress Notes (Signed)
BP 124/87   Pulse 90   Temp 97.7 F (36.5 C) (Oral)   Ht 6\' 3"  (1.905 m)   Wt 252 lb (114.3 kg)   BMI 31.50 kg/m    Subjective:    Patient ID: Chase Christensen, male    DOB: 02/01/1971, 47 y.o.   MRN: [id number]  HPI: Chase Christensen is a 47 y.o. male presenting on 08/12/2017 for Follow-up (Muscle cramps )  Two days ago he started with bad myalgias and fatigue. Denies fever and chills. Reports that his bronchitis was getting better. Finished the tamiflu already.  Has strong history of gout and high uric acid. Denies alcohol use except on rare occasion.  His urine became very concentrate.  Bowel movements were the same. Had him hold omnicef, it has a rare occurrence of rhabdomyolysis.  Began feeling better after 24 hours.    Past Medical History:  Diagnosis Date  . COPD (chronic obstructive pulmonary disease) (HCC)   . ED (erectile dysfunction)   . Gout   . Hypertension   . Pneumonia   . Sleep apnea with use of continuous positive airway pressure (CPAP)    Relevant past medical, surgical, family and social history reviewed and updated as indicated. Interim medical history since our last visit reviewed. Allergies and medications reviewed and updated. DATA REVIEWED: CHART IN EPIC  Family History reviewed for pertinent findings.  Review of Systems  Constitutional: Positive for activity change and fatigue. Negative for appetite change and fever.  HENT: Negative for congestion, sinus pressure and sore throat.   Eyes: Negative.  Negative for pain and visual disturbance.  Respiratory: Positive for shortness of breath and wheezing. Negative for cough and chest tightness.   Cardiovascular: Negative.  Negative for chest pain, palpitations and leg swelling.  Gastrointestinal: Positive for nausea. Negative for abdominal pain, diarrhea and vomiting.  Endocrine: Negative.   Genitourinary: Negative.   Musculoskeletal: Positive for back pain and myalgias. Negative for arthralgias.  Skin:  Negative.  Negative for color change and rash.  Neurological: Negative for weakness, numbness and headaches.  Psychiatric/Behavioral: Negative.     Allergies as of 08/12/2017      Reactions   Penicillins Rash      Medication List        Accurate as of 08/12/17 11:59 PM. Always use your most recent med list.          albuterol 108 (90 Base) MCG/ACT inhaler Commonly known as:  PROVENTIL HFA;VENTOLIN HFA Inhale 2 puffs into the lungs every 6 (six) hours as needed for wheezing or shortness of breath.   cefdinir 300 MG capsule Commonly known as:  OMNICEF Take 1 capsule (300 mg total) by mouth 2 (two) times daily. 1 po BID   chlorthalidone 25 MG tablet Commonly known as:  HYGROTON Take 1 tablet (25 mg total) by mouth daily.   colchicine 0.6 MG tablet Take 2 pills at first sight of flare, repeat 1 pill 1 hour later, then daily for 4-5 days   doxycycline 100 MG tablet Commonly known as:  VIBRA-TABS Take 1 tablet (100 mg total) by mouth 2 (two) times daily. 1 po bid   HYDROcodone-homatropine 5-1.5 MG/5ML syrup Commonly known as:  HYCODAN Take 5-10 mLs by mouth every 6 (six) hours as needed.   lisinopril 20 MG tablet Commonly known as:  PRINIVIL,ZESTRIL Take 1 tablet (20 mg total) by mouth daily.   pantoprazole 40 MG tablet Commonly known as:  PROTONIX Take 1 tablet (40 mg total)  by mouth daily before breakfast.   sildenafil 20 MG tablet Commonly known as:  REVATIO TAKE 2-5 TABLETS AS NEEDED PRIOR TO SEXUAL ACTIVITY          Objective:    BP 124/87   Pulse 90   Temp 97.7 F (36.5 C) (Oral)   Ht 6\' 3"  (1.905 m)   Wt 252 lb (114.3 kg)   BMI 31.50 kg/m   Allergies  Allergen Reactions  . Penicillins Rash    Wt Readings from Last 3 Encounters:  08/13/17 252 lb (114.3 kg)  08/12/17 252 lb (114.3 kg)  08/06/17 266 lb (120.7 kg)    Physical Exam  Constitutional: He appears well-developed and well-nourished.  HENT:  Head: Normocephalic and atraumatic.  Eyes:  Conjunctivae and EOM are normal. Pupils are equal, round, and reactive to light.  Neck: Normal range of motion. Neck supple.  Cardiovascular: Normal rate, regular rhythm and normal heart sounds.  Pulmonary/Chest: Effort normal. He has no decreased breath sounds. He has wheezes in the right lower field, the left middle field and the left lower field.  Abdominal: Soft. Bowel sounds are normal.  Musculoskeletal: Normal range of motion.  Skin: Skin is warm and dry.    Results for orders placed or performed in visit on 08/11/17  Microscopic Examination  Result Value Ref Range   WBC, UA None seen 0 - 5 /hpf   RBC, UA 0-2 0 - 2 /hpf   Epithelial Cells (non renal) 0-10 0 - 10 /hpf   Renal Epithel, UA None seen None seen /hpf   Mucus, UA Present Not Estab.  Phosphorus  Result Value Ref Range   Phosphorus 4.5 2.5 - 4.5 mg/dL  Uric acid  Result Value Ref Range   Uric Acid 11.8 (H) 3.7 - 8.6 mg/dL  Urinalysis, Complete  Result Value Ref Range   Specific Gravity, UA 1.020 1.005 - 1.030   pH, UA 5.0 5.0 - 7.5   Color, UA Yellow Yellow   Appearance Ur Clear Clear   Leukocytes, UA Negative Negative   Protein, UA Negative Negative/Trace   Glucose, UA Negative Negative   Ketones, UA Negative Negative   RBC, UA Negative Negative   Bilirubin, UA Negative Negative   Urobilinogen, Ur 0.2 0.2 - 1.0 mg/dL   Nitrite, UA Negative Negative   Microscopic Examination See below:   Microalbumin / creatinine urine ratio  Result Value Ref Range   Creatinine, Urine 165.6 Not Estab. mg/dL   Microalbumin, Urine 40.3 Not Estab. ug/mL   Microalb/Creat Ratio 8.1 0.0 - 30.0 mg/g creat  CMP14+EGFR  Result Value Ref Range   Glucose 142 (H) 65 - 99 mg/dL   BUN 43 (H) 6 - 24 mg/dL   Creatinine, Ser 4.74 (H) 0.76 - 1.27 mg/dL   GFR calc non Af Amer 43 (L) >59 mL/min/1.73   GFR calc Af Amer 50 (L) >59 mL/min/1.73   BUN/Creatinine Ratio 23 (H) 9 - 20   Sodium 138 134 - 144 mmol/L   Potassium 4.3 3.5 - 5.2  mmol/L   Chloride 100 96 - 106 mmol/L   CO2 20 20 - 29 mmol/L   Calcium 9.4 8.7 - 10.2 mg/dL   Total Protein 6.9 6.0 - 8.5 g/dL   Albumin 4.3 3.5 - 5.5 g/dL   Globulin, Total 2.6 1.5 - 4.5 g/dL   Albumin/Globulin Ratio 1.7 1.2 - 2.2   Bilirubin Total 0.3 0.0 - 1.2 mg/dL   Alkaline Phosphatase 79 39 - 117 IU/L  AST 30 0 - 40 IU/L   ALT 36 0 - 44 IU/L  CBC with Differential/Platelet  Result Value Ref Range   WBC 10.9 (H) 3.4 - 10.8 x10E3/uL   RBC 5.05 4.14 - 5.80 x10E6/uL   Hemoglobin 16.9 13.0 - 17.7 g/dL   Hematocrit 69.6 29.5 - 51.0 %   MCV 94 79 - 97 fL   MCH 33.5 (H) 26.6 - 33.0 pg   MCHC 35.6 31.5 - 35.7 g/dL   RDW 28.4 13.2 - 44.0 %   Platelets 244 150 - 379 x10E3/uL   Neutrophils 65 Not Estab. %   Lymphs 24 Not Estab. %   Monocytes 7 Not Estab. %   Eos 4 Not Estab. %   Basos 0 Not Estab. %   Neutrophils Absolute 7.0 1.4 - 7.0 x10E3/uL   Lymphocytes Absolute 2.6 0.7 - 3.1 x10E3/uL   Monocytes Absolute 0.8 0.1 - 0.9 x10E3/uL   EOS (ABSOLUTE) 0.4 0.0 - 0.4 x10E3/uL   Basophils Absolute 0.0 0.0 - 0.2 x10E3/uL   Immature Granulocytes 0 Not Estab. %   Immature Grans (Abs) 0.0 0.0 - 0.1 x10E3/uL  CK  Result Value Ref Range   Total CK 413 (H) 24 - 204 U/L  Specimen status report  Result Value Ref Range   specimen status report Comment       Assessment & Plan:   1. Myalgia - CK - CMP14+EGFR; Standing - CK; Standing  2. Non-traumatic rhabdomyolysis Recheck labs in 2 days Then again in 3-4 days  Continue all other maintenance medications as listed above.  Follow up plan: No Follow-up on file.  Educational handout given for survey  Remus Loffler PA-C [facility] [address]  [place], [place] [address] [phone]   08/13/2017, 9:16 PM

## 2017-08-13 NOTE — Patient Instructions (Signed)
In a few days you may receive a survey in the mail or online from [employer] regarding your visit with us today. Please take a moment to fill this out. Your feedback is very important to our whole office. It can help us better understand your needs as well as improve your experience and satisfaction. Thank you for taking your time to complete it. We care about you.  Irlanda Croghan, PA-C  

## 2017-08-14 MED ORDER — ONDANSETRON 4 MG PO TBDP
4.0000 mg | ORAL_TABLET | Freq: Once | ORAL | Status: AC
  Administered 2017-08-14: 4 mg via ORAL
  Filled 2017-08-14: qty 1

## 2017-08-14 MED ORDER — ONDANSETRON 4 MG PO TBDP: 4.0000 mg | ORAL_TABLET | Freq: Three times a day (TID) | ORAL | 0 refills | Status: DC | PRN

## 2017-08-14 MED ORDER — DICYCLOMINE HCL 20 MG PO TABS: 20.0000 mg | ORAL_TABLET | Freq: Two times a day (BID) | ORAL | 0 refills | Status: DC

## 2017-08-14 MED ORDER — DICYCLOMINE HCL 10 MG PO CAPS
10.0000 mg | ORAL_CAPSULE | Freq: Once | ORAL | Status: AC
  Administered 2017-08-14: 10 mg via ORAL
  Filled 2017-08-14: qty 1

## 2017-08-14 NOTE — ED Provider Notes (Signed)
[facility] EMERGENCY DEPARTMENT Provider Note   CSN: [id number] Arrival date & time: 08/13/17  1637     History   Chief Complaint Chief Complaint  Patient presents with  . Abdominal Pain    HPI Chase Christensen is a 47 y.o. male.  The history is provided by the patient. No language interpreter was used.  Abdominal Pain   This is a new problem. The current episode started 12 to 24 hours ago. The problem occurs constantly. The problem has been gradually worsening. The pain is associated with an unknown factor. The pain is located in the LLQ and RLQ. The pain is at a severity of 5/10. The pain is moderate. Pertinent negatives include anorexia and fever. Nothing aggravates the symptoms. Nothing relieves the symptoms. Past workup does not include GI consult. His past medical history does not include PUD.  Pt reports he was on cefdiner for pneumonia.  Pt reports his MD changed him to Doxycycline.  Pt reports he is now having abdominal pain and diarrhea.  Pt reports pain left lower abdomen.  Pt complains of cramping.   Past Medical History:  Diagnosis Date  . COPD (chronic obstructive pulmonary disease) (HCC)   . ED (erectile dysfunction)   . Gout   . Hypertension   . Pneumonia   . Sleep apnea with use of continuous positive airway pressure (CPAP)     Patient Active Problem List   Diagnosis Date Noted  . Gout 07/17/2016  . Chest pain 07/13/2015  . Erectile dysfunction 06/08/2015  . Snoring 06/08/2015  . Essential hypertension 05/03/2015  . Fatigue 05/03/2015  . Tobacco use disorder 05/03/2015    Past Surgical History:  Procedure Laterality Date  . KNEE ARTHROSCOPY Right 03/15/2015  . WISDOM TOOTH EXTRACTION         Home Medications    Prior to Admission medications   Medication Sig Start Date End Date Taking? Authorizing Provider  albuterol (PROVENTIL HFA;VENTOLIN HFA) 108 (90 Base) MCG/ACT inhaler Inhale 2 puffs into the lungs every 6 (six) hours as needed for  wheezing or shortness of breath. 08/06/17   Remus Loffler, PA-C  cefdinir (OMNICEF) 300 MG capsule Take 1 capsule (300 mg total) by mouth 2 (two) times daily. 1 po BID Patient not taking: Reported on 08/12/2017 08/06/17   Remus Loffler, PA-C  chlorthalidone (HYGROTON) 25 MG tablet Take 1 tablet (25 mg total) by mouth daily. 07/20/17   Elenora Gamma, MD  colchicine 0.6 MG tablet Take 2 pills at first sight of flare, repeat 1 pill 1 hour later, then daily for 4-5 days 07/17/16   Elenora Gamma, MD  dicyclomine (BENTYL) 20 MG tablet Take 1 tablet (20 mg total) by mouth 2 (two) times daily. 08/14/17   Elson Areas, PA-C  doxycycline (VIBRA-TABS) 100 MG tablet Take 1 tablet (100 mg total) by mouth 2 (two) times daily. 1 po bid 08/12/17   Remus Loffler, PA-C  HYDROcodone-homatropine [facility]) 5-1.5 MG/5ML syrup Take 5-10 mLs by mouth every 6 (six) hours as needed. 08/06/17   Remus Loffler, PA-C  lisinopril (PRINIVIL,ZESTRIL) 20 MG tablet Take 1 tablet (20 mg total) by mouth daily. 07/20/17   Elenora Gamma, MD  ondansetron (ZOFRAN ODT) 4 MG disintegrating tablet Take 1 tablet (4 mg total) by mouth every 8 (eight) hours as needed for nausea or vomiting. 08/14/17   Elson Areas, PA-C  pantoprazole (PROTONIX) 40 MG tablet Take 1 tablet (40 mg total) by mouth  daily before breakfast. 10/17/16   Esterwood, Amy S, PA-C  sildenafil (REVATIO) 20 MG tablet TAKE 2-5 TABLETS AS NEEDED PRIOR TO SEXUAL ACTIVITY 05/19/17   Elenora GammaBradshaw, Samuel L, MD    Family History Family History  Problem Relation Age of Onset  . Hypertension Mother   . Diabetes Maternal Grandmother   . Lung cancer Maternal Grandfather   . Pancreatic cancer Paternal Grandmother   . Colon cancer Paternal Uncle   . Colon polyps Cousin   . Cancer Other        all paternal uncles and anunts passed with cancer    Social History Social History   Tobacco Use  . Smoking status: Current Every Day Smoker    Packs/day: 0.25    Types:  Cigarettes  . Smokeless tobacco: Current User    Types: Chew  Substance Use Topics  . Alcohol use: Yes    Comment: occas  . Drug use: No     Allergies   Penicillins   Review of Systems Review of Systems  Constitutional: Negative for fever.  Gastrointestinal: Positive for abdominal pain. Negative for anorexia.  All other systems reviewed and are negative.    Physical Exam Updated Vital Signs BP 113/77   Pulse 76   Temp 97.8 F (36.6 C) (Oral)   Resp 18   Ht 6\' 3"  (1.905 m)   Wt 114.3 kg (252 lb)   SpO2 95%   BMI 31.50 kg/m   Physical Exam  Constitutional: He appears well-developed and well-nourished.  HENT:  Head: Normocephalic.  Mouth/Throat: Oropharynx is clear and moist.  Eyes: EOM are normal. Pupils are equal, round, and reactive to light.  Cardiovascular: Normal rate.  Pulmonary/Chest: Effort normal.  Abdominal: Normal appearance and bowel sounds are normal. There is tenderness in the left lower quadrant.  Neurological: He is alert.  Skin: Skin is warm. Capillary refill takes less than 2 seconds.  Psychiatric: He has a normal mood and affect.  Nursing note and vitals reviewed.    ED Treatments / Results  Labs (all labs ordered are listed, but only abnormal results are displayed) Labs Reviewed  COMPREHENSIVE METABOLIC PANEL - Abnormal; Notable for the following components:      Result Value   CO2 20 (*)    Glucose, Bld 111 (*)    BUN 36 (*)    Creatinine, Ser 1.37 (*)    All other components within normal limits  CBC - Abnormal; Notable for the following components:   WBC 12.3 (*)    Hemoglobin 17.4 (*)    All other components within normal limits  LIPASE, BLOOD  URINALYSIS, ROUTINE W REFLEX MICROSCOPIC    EKG  EKG Interpretation None       Radiology Ct Abdomen Pelvis Wo Contrast  Result Date: 08/14/2017 CLINICAL DATA:  Abdominal pain and diarrhea EXAM: CT ABDOMEN AND PELVIS WITHOUT CONTRAST TECHNIQUE: Multidetector CT imaging of the  abdomen and pelvis was performed following the standard protocol without IV contrast. COMPARISON:  None. FINDINGS: Lower chest: Lung bases demonstrate mild emphysema. No consolidation or pleural effusion. Normal heart size. Hepatobiliary: No focal liver abnormality is seen. No gallstones, gallbladder wall thickening, or biliary dilatation. Pancreas: Unremarkable. No pancreatic ductal dilatation or surrounding inflammatory changes. Spleen: Normal in size without focal abnormality. Adrenals/Urinary Tract: Adrenal glands are unremarkable. Kidneys are normal, without renal calculi, focal lesion, or hydronephrosis. Bladder is unremarkable. Stomach/Bowel: Stomach is within normal limits. Appendix appears normal. No evidence of bowel wall thickening, distention, or inflammatory changes.  Sigmoid colon diverticular disease without acute inflammation. Vascular/Lymphatic: Scattered atherosclerotic calcification of the aorta. No aneurysmal dilatation. No significantly enlarged lymph nodes. Reproductive: Prostate is unremarkable. Other: Fat in the inguinal canals. No free air or free fluid. Fat in the umbilical region. Musculoskeletal: Degenerative changes of the spine. No acute or suspicious lesion. IMPRESSION: 1. No CT evidence for acute intra-abdominal or pelvic abnormality 2. Sigmoid colon diverticular disease without acute inflammation 3. Mild emphysema at the bases. Electronically Signed   By: Jasmine Pang M.D.   On: 08/14/2017 00:17   Dg Chest 2 View  Result Date: 08/13/2017 CLINICAL DATA:  Productive cough and shortness of breath over the last week. EXAM: CHEST  2 VIEW COMPARISON:  04/24/2007 FINDINGS: Heart size is normal. Mediastinal shadows are normal. There is mild central bronchial thickening but no infiltrate, collapse or effusion. No bone abnormality. IMPRESSION: Bronchitis pattern.  No consolidation or collapse. Electronically Signed   By: Paulina Fusi M.D.   On: 08/13/2017 17:21    Procedures Procedures  (including critical care time)  Medications Ordered in ED Medications  sodium chloride 0.9 % bolus 1,000 mL (0 mLs Intravenous Stopped 08/14/17 0023)  dicyclomine (BENTYL) capsule 10 mg (10 mg Oral Given 08/14/17 0037)  ondansetron (ZOFRAN-ODT) disintegrating tablet 4 mg (4 mg Oral Given 08/14/17 0037)     Initial Impression / Assessment and Plan / ED Course  I have reviewed the triage vital signs and the nursing notes.  Pertinent labs & imaging results that were available during my care of the patient were reviewed by me and considered in my medical decision making (see chart for details).     MDM:   Chest xray reviewed. No pneumonia on chest xray,  Ct scan of abdomen due to pain .  No acute abnormality.  Pt has elevated BUN and creatine.  Pt given Iv fluids,  He tolerates oral fluids.   I am suspicious diarrhea is second to antibiotics.  I advised stop antibiotics.  Pt given rx for bentyl and zofran.   Pt is advised he needs to recheck with his MD for follow up labs.   Pt advised if diarrhea persist, he may need stool studies.  Final Clinical Impressions(s) / ED Diagnoses   Final diagnoses:  Diarrhea of infectious origin    ED Discharge Orders        Ordered    ondansetron (ZOFRAN ODT) 4 MG disintegrating tablet  Every 8 hours PRN     08/14/17 0120    dicyclomine (BENTYL) 20 MG tablet  2 times daily     08/14/17 0120    An After Visit Summary was printed and given to the patient.    Elson Areas, [place] 08/14/17 1614    Mancel Bale, MD 08/15/17 [phone]

## 2017-08-14 NOTE — ED Notes (Signed)
Given po fluids with meds.

## 2017-08-14 NOTE — Discharge Instructions (Signed)
Stop antibiotics.  Follow up with your Physician for recheck.  If diarrhea  persist you may require more testing.

## 2017-08-19 ENCOUNTER — Other Ambulatory Visit: Payer: BLUE CROSS/BLUE SHIELD

## 2017-08-19 DIAGNOSIS — M791 Myalgia, unspecified site: Secondary | ICD-10-CM

## 2017-08-20 LAB — CMP14+EGFR
A/G RATIO: 1.8 (ref 1.2–2.2)
ALBUMIN: 4.1 g/dL (ref 3.5–5.5)
ALK PHOS: 83 IU/L (ref 39–117)
ALT: 34 IU/L (ref 0–44)
AST: 25 IU/L (ref 0–40)
BUN / CREAT RATIO: 19 (ref 9–20)
BUN: 19 mg/dL (ref 6–24)
Bilirubin Total: 0.3 mg/dL (ref 0.0–1.2)
CO2: 19 mmol/L — ABNORMAL LOW (ref 20–29)
Calcium: 9.6 mg/dL (ref 8.7–10.2)
Chloride: 108 mmol/L — ABNORMAL HIGH (ref 96–106)
Creatinine, Ser: 1.02 mg/dL (ref 0.76–1.27)
GFR calc Af Amer: 102 mL/min/{1.73_m2} (ref >59)
GFR calc non Af Amer: 88 mL/min/{1.73_m2} (ref >59)
GLOBULIN, TOTAL: 2.3 g/dL (ref 1.5–4.5)
Glucose: 121 mg/dL — ABNORMAL HIGH (ref 65–99)
POTASSIUM: 4.5 mmol/L (ref 3.5–5.2)
SODIUM: 143 mmol/L (ref 134–144)
Total Protein: 6.4 g/dL (ref 6.0–8.5)

## 2017-08-20 LAB — CK: Total CK: 127 U/L (ref 24–204)

## 2017-08-25 ENCOUNTER — Ambulatory Visit: Payer: BLUE CROSS/BLUE SHIELD | Admitting: Family Medicine

## 2017-08-27 ENCOUNTER — Ambulatory Visit (INDEPENDENT_AMBULATORY_CARE_PROVIDER_SITE_OTHER): Payer: BLUE CROSS/BLUE SHIELD | Admitting: Family Medicine

## 2017-08-27 ENCOUNTER — Encounter: Payer: Self-pay | Admitting: Family Medicine

## 2017-08-27 VITALS — BP 146/82 | HR 91 | Temp 97.3°F | Ht 75.0 in | Wt 263.4 lb

## 2017-08-27 DIAGNOSIS — K219 Gastro-esophageal reflux disease without esophagitis: Secondary | ICD-10-CM | POA: Insufficient documentation

## 2017-08-27 DIAGNOSIS — I10 Essential (primary) hypertension: Secondary | ICD-10-CM

## 2017-08-27 MED ORDER — PANTOPRAZOLE SODIUM 40 MG PO TBEC: 40.0000 mg | DELAYED_RELEASE_TABLET | Freq: Every day | ORAL | 3 refills | Status: DC

## 2017-08-27 NOTE — Progress Notes (Signed)
   HPI  Patient presents today here to follow-up for hypertension.  Patient feels well, has had a recent illness and has completely recovered from that.  He has GERD, Protonix works very well for that he needs a refill.  Hypertension Good medication compliance, recently stopped for about 2 weeks  PMH: Smoking status noted ROS: Per HPI  Objective: BP (!) 146/82   Pulse 91   Temp (!) 97.3 F (36.3 C) (Oral)   Ht 6\' 3"  (1.905 m)   Wt 263 lb 6.4 oz (119.5 kg)   BMI 32.92 kg/m  Gen: NAD, alert, cooperative with exam HEENT: NCAT CV: RRR, good S1/S2, no murmur Resp: CTABL, no wheezes, non-labored Neuro: Alert and oriented, No gross deficits  Assessment and plan:  #Hypertension Reasonable control on chlorthalidone plus lisinopril Likely need to titrate lisinopril next visit, patient has only been back on chlorthalidone for about a week at this point. Tolerating meds well. Repeat recent labs have been good Elevated hemoglobin at the hospital likely due to dehydration, note his BUN creatinine ratio was greater than 20 at that time and back to normal on repeat  #GERD Refill Protonix, controlled well   Meds ordered this encounter  Medications  . pantoprazole (PROTONIX) 40 MG tablet    Sig: Take 1 tablet (40 mg total) by mouth daily before breakfast.    Dispense:  90 tablet    Refill:  3    Murtis SinkSam Bradshaw, MD [facility] 08/27/2017, 8:34 AM

## 2017-08-27 NOTE — Patient Instructions (Signed)
Great to see you!  Come back in 4-6 months to follow up for high blood pressure.

## 2017-08-29 ENCOUNTER — Other Ambulatory Visit: Payer: Self-pay | Admitting: Family Medicine

## 2017-08-29 DIAGNOSIS — I10 Essential (primary) hypertension: Secondary | ICD-10-CM

## 2017-09-02 ENCOUNTER — Other Ambulatory Visit: Payer: Self-pay | Admitting: Physician Assistant

## 2017-09-02 DIAGNOSIS — J189 Pneumonia, unspecified organism: Secondary | ICD-10-CM

## 2017-09-02 DIAGNOSIS — J181 Lobar pneumonia, unspecified organism: Principal | ICD-10-CM

## 2017-09-05 ENCOUNTER — Other Ambulatory Visit: Payer: Self-pay | Admitting: Family Medicine

## 2017-09-07 NOTE — Telephone Encounter (Signed)
Last seen 08/27/17  Dr Ermalinda MemosBradshaw

## 2018-01-11 DIAGNOSIS — M109 Gout, unspecified: Secondary | ICD-10-CM | POA: Diagnosis not present

## 2018-01-11 DIAGNOSIS — Z6832 Body mass index (BMI) 32.0-32.9, adult: Secondary | ICD-10-CM | POA: Diagnosis not present

## 2018-01-13 ENCOUNTER — Ambulatory Visit: Payer: BLUE CROSS/BLUE SHIELD | Admitting: Pediatrics

## 2018-01-13 ENCOUNTER — Encounter: Payer: Self-pay | Admitting: Pediatrics

## 2018-01-13 VITALS — BP 145/101 | HR 69

## 2018-01-13 DIAGNOSIS — M109 Gout, unspecified: Secondary | ICD-10-CM | POA: Diagnosis not present

## 2018-01-13 DIAGNOSIS — I10 Essential (primary) hypertension: Secondary | ICD-10-CM

## 2018-01-13 MED ORDER — COLCHICINE 0.6 MG PO TABS: ORAL_TABLET | ORAL | 1 refills | Status: DC

## 2018-01-13 MED ORDER — LISINOPRIL 30 MG PO TABS: 30.0000 mg | ORAL_TABLET | Freq: Every day | ORAL | 3 refills | Status: DC

## 2018-01-13 MED ORDER — HYDROCODONE-ACETAMINOPHEN 5-325 MG PO TABS: 1.0000 | ORAL_TABLET | ORAL | 0 refills | Status: AC | PRN

## 2018-01-13 MED ORDER — PREDNISONE 10 MG PO TABS: 10.0000 mg | ORAL_TABLET | Freq: Every day | ORAL | 0 refills | Status: DC

## 2018-01-13 NOTE — Progress Notes (Signed)
Subjective:   Patient ID: Chase Christensen, male    DOB: Jan 01, 1971, 47 y.o.   MRN: [id number] CC: Gout  HPI: Chase Christensen is a 47 y.o. male  Pain in right ankle started out of the blue for 5 days ago, starting yesterday feels that it is moving in to great toe. Has hx of gout. Seen at Urgent care on 7/8 and given a steroid shot. Also instructed to take ibuprofen 600mg  TID.  He took colchicine for the first 2 days of flare until he ran out of it. Last gout flare was about 3 years ago he thinks.  He does not take a preventative because flares are infrequent. Was not given any other treatment. Pain is severe.  Feels like prior episodes of gout.  No injury that he knows of to the ankle.  He is prescribed chlorthalidone for control of fluids, has not been on it for the last couple months.  He usually stops in the summertime because he gets more dehydrated, he works outside regularly.  He had hamburgers and hotdogs 6 days ago followed by steak the next day.  He drinks 1 beer a day.  He thinks the increase in red meat is what caused this flare.  Hypertension: Taking lisinopril daily.  He has a blood pressure cuff at home that he is not usually used.  Relevant past medical, surgical, family and social history reviewed. Allergies and medications reviewed and updated. Social History   Tobacco Use  Smoking Status Current Every Day Smoker  . Packs/day: 0.25  . Types: Cigarettes  Smokeless Tobacco Current User  . Types: Chew   ROS: Per HPI   Objective:    BP (!) 145/101 (BP Location: Left Arm, Patient Position: Sitting, Cuff Size: Normal)   Pulse 69   Wt Readings from Last 3 Encounters:  08/27/17 263 lb 6.4 oz (119.5 kg)  08/13/17 252 lb (114.3 kg)  08/12/17 252 lb (114.3 kg)    Gen: NAD, alert, cooperative with exam, NCAT EYES: EOMI, no conjunctival injection, or no icterus, slight puffiness below eyes b/l CV: NRRR, normal S1/S2, no murmur, distal pulses 2+ b/l Resp: CTABL, no wheezes,  normal WOB Abd: +BS, soft, NTND.  Ext: No edema, warm Neuro: Alert and oriented MSK: Right ankle slightly red, swollen compared to left ankle.  DP pulse 2+.  Pain with weightbearing.  Assessment & Plan:  [place] was seen today for gout.  Diagnoses and all orders for this visit:  Acute gout of right ankle, unspecified cause Has been trying NSAIDs at home, one-time dose of steroid.  Still with ongoing severe pain.  Will do taper dose of steroids. F/u with PCP as scheduled for uric acid level, consider allopurinol. -     predniSONE (DELTASONE) 10 MG tablet; Take 1 tablet (10 mg total) by mouth daily with breakfast. Take 4 tabs x 3 days, then 3, 2,  1 tab each for 3 days -     colchicine 0.6 MG tablet; Take 2 pills at first sight of flare, repeat 1 pill 1 hour later, then daily for 4-5 days -     HYDROcodone-acetaminophen (NORCO/VICODIN) 5-325 MG tablet; Take 1 tablet by mouth every 4 (four) hours as needed for up to 5 days for moderate pain.  Essential hypertension Very elevated today. Increase lisinopril to 30mg .  Not currently taking chlorthalidone.  Now that he has had recurrent gout should avoid thiazide diuretics.   Follow up plan: Return in about 2 weeks (around 01/27/2018).  Rex Kras, MD [place] [facility]

## 2018-01-25 ENCOUNTER — Encounter: Payer: Self-pay | Admitting: Family Medicine

## 2018-01-25 ENCOUNTER — Ambulatory Visit: Payer: BLUE CROSS/BLUE SHIELD | Admitting: Family Medicine

## 2018-01-25 VITALS — BP 140/99 | HR 83 | Temp 97.0°F | Ht 75.0 in | Wt 265.2 lb

## 2018-01-25 DIAGNOSIS — I10 Essential (primary) hypertension: Secondary | ICD-10-CM

## 2018-01-25 DIAGNOSIS — R7309 Other abnormal glucose: Secondary | ICD-10-CM | POA: Diagnosis not present

## 2018-01-25 LAB — BMP8+EGFR
BUN/Creatinine Ratio: 12 (ref 9–20)
BUN: 14 mg/dL (ref 6–24)
CO2: 20 mmol/L (ref 20–29)
Calcium: 9.5 mg/dL (ref 8.7–10.2)
Chloride: 108 mmol/L — ABNORMAL HIGH (ref 96–106)
Creatinine, Ser: 1.14 mg/dL (ref 0.76–1.27)
GFR, EST AFRICAN AMERICAN: 89 mL/min/{1.73_m2} (ref >59)
GFR, EST NON AFRICAN AMERICAN: 77 mL/min/{1.73_m2} (ref >59)
Glucose: 110 mg/dL — ABNORMAL HIGH (ref 65–99)
POTASSIUM: 4.5 mmol/L (ref 3.5–5.2)
SODIUM: 145 mmol/L — AB (ref 134–144)

## 2018-01-25 MED ORDER — LISINOPRIL 40 MG PO TABS: 40.0000 mg | ORAL_TABLET | Freq: Every day | ORAL | 3 refills | Status: DC

## 2018-01-25 MED ORDER — AMLODIPINE BESYLATE 5 MG PO TABS: 5.0000 mg | ORAL_TABLET | Freq: Every day | ORAL | 3 refills | Status: DC

## 2018-01-25 NOTE — Patient Instructions (Signed)
Great to see you! Come back in 4-6 weeks to see Dr. Oswaldo DoneVincent  Increase lisinopril to 40 mg, and add amlodipine 1 pill at night

## 2018-01-25 NOTE — Progress Notes (Signed)
   HPI  Patient presents today for follow-up chronic medical conditions.  Patient is recovering well from his recent gout flare.  Patient has increased to 30 mg lisinopril is tolerating it well.  He is previously on chlorthalidone which she stopped in the summertime due to working outside quite a bit.  We discussed this may also worsen his gout so we will avoid this in the future.  Denies headache or chest pain.   PMH: Smoking status noted ROS: Per HPI  Objective: BP (!) 140/99   Pulse 83   Temp (!) 97 F (36.1 C) (Oral)   Ht 6\' 3"  (1.905 m)   Wt 265 lb 3.2 oz (120.3 kg)   BMI 33.15 kg/m  Gen: NAD, alert, cooperative with exam HEENT: NCAT CV: RRR, good S1/S2, no murmur Resp: CTABL, no wheezes, non-labored Ext: No edema, warm Neuro: Alert and oriented, No gross deficits  Assessment and plan:  #Hypertension Titrate lisinopril to 40 mg, add amlodipine 5 mg Labs Return to clinic in 1 to 2 months to see new PCP for blood work and BP recheck   Murtis SinkSam Colie Josten, MD [facility] 01/25/2018, 8:07 AM

## 2018-01-27 LAB — BAYER DCA HB A1C WAIVED: HB A1C: 5.9 % (ref <7.0)

## 2018-01-27 NOTE — Addendum Note (Signed)
Addended by: Lorelee CoverOSTOSKY, JESSICA C on: 01/27/2018 09:40 AM   Modules accepted: Orders

## 2018-03-01 ENCOUNTER — Other Ambulatory Visit: Payer: Self-pay

## 2018-03-01 MED ORDER — SILDENAFIL CITRATE 20 MG PO TABS: 20.0000 mg | ORAL_TABLET | Freq: Every day | ORAL | 0 refills | Status: DC | PRN

## 2018-03-01 NOTE — Telephone Encounter (Signed)
Last seen 01/25/18  Dr Bradshaw 

## 2018-03-02 ENCOUNTER — Encounter: Payer: Self-pay | Admitting: Pediatrics

## 2018-03-02 ENCOUNTER — Ambulatory Visit: Payer: BLUE CROSS/BLUE SHIELD | Admitting: Pediatrics

## 2018-03-02 VITALS — BP 142/105 | HR 66 | Temp 98.0°F | Ht 75.0 in | Wt 263.6 lb

## 2018-03-02 DIAGNOSIS — Z6832 Body mass index (BMI) 32.0-32.9, adult: Secondary | ICD-10-CM

## 2018-03-02 DIAGNOSIS — I10 Essential (primary) hypertension: Secondary | ICD-10-CM

## 2018-03-02 DIAGNOSIS — Z8739 Personal history of other diseases of the musculoskeletal system and connective tissue: Secondary | ICD-10-CM | POA: Diagnosis not present

## 2018-03-02 NOTE — Patient Instructions (Signed)
Check BPs at home, send numbers to me in 2-3 weeks via My Chart. Let me know if regularly >140 or >90

## 2018-03-02 NOTE — Progress Notes (Signed)
Subjective:   Patient ID: Chase Christensen, male    DOB: 1970-07-26, 47 y.o.   MRN: [id number] CC: Hypertension (1 month)  HPI: Chase Christensen is a 47 y.o. male   Gout: Over the last couple months has had 3-4 flares.  He treats it with colchicine.  Flares improved after 3 to 4 days.  No current symptoms.  Elevated BMI: Working along with his daughter to change diet.  Tobacco use: Working on decreasing.  Does not smoke at home or in his car.  Hypertension: Taking meds regularly.  Blood pressures at home that are than today's.  Relevant past medical, surgical, family and social history reviewed. Allergies and medications reviewed and updated. Social History   Tobacco Use  Smoking Status Current Every Day Smoker  . Packs/day: 0.25  . Types: Cigarettes  Smokeless Tobacco Current User  . Types: Chew   ROS: Per HPI   Objective:    BP (!) 142/105   Pulse 66   Temp 98 F (36.7 C) (Oral)   Ht 6\' 3"  (1.905 m)   Wt 263 lb 9.6 oz (119.6 kg)   BMI 32.95 kg/m   Wt Readings from Last 3 Encounters:  03/02/18 263 lb 9.6 oz (119.6 kg)  01/25/18 265 lb 3.2 oz (120.3 kg)  08/27/17 263 lb 6.4 oz (119.5 kg)    Gen: NAD, alert, cooperative with exam, NCAT EYES: EOMI, no conjunctival injection, or no icterus ENT:  OP without erythema LYMPH: no cervical LAD CV: NRRR, normal S1/S2, no murmur, distal pulses 2+ b/l Resp: CTABL, no wheezes, normal WOB Abd: +BS, soft, NTND.  Ext: No edema, warm Neuro: Alert and oriented MSK: normal muscle bulk  Assessment & Plan:  Chase Christensen was seen today for hypertension.  Diagnoses and all orders for this visit:  Essential hypertension Elevated today.  Check blood pressures at home.  Let me know in 2 to 3 weeks with numbers are.  If regularly greater than 140 or greater than 90 let me know sooner.  History of gout Uric acid level elevated.  Will start allopurinol given frequency of flares. -     BMP8+EGFR -     Uric Acid  BMI  32.0-32.9,adult Continue weight loss efforts.  Follow up plan: Return in about 3 months (around 06/02/2018). Rex Kras, MD [facility]

## 2018-03-03 ENCOUNTER — Other Ambulatory Visit: Payer: Self-pay | Admitting: Pediatrics

## 2018-03-03 DIAGNOSIS — E790 Hyperuricemia without signs of inflammatory arthritis and tophaceous disease: Secondary | ICD-10-CM

## 2018-03-03 LAB — URIC ACID: Uric Acid: 9.2 mg/dL — ABNORMAL HIGH (ref 3.7–8.6)

## 2018-03-03 LAB — BMP8+EGFR
BUN/Creatinine Ratio: 15 (ref 9–20)
BUN: 16 mg/dL (ref 6–24)
CALCIUM: 9.4 mg/dL (ref 8.7–10.2)
CO2: 23 mmol/L (ref 20–29)
CREATININE: 1.06 mg/dL (ref 0.76–1.27)
Chloride: 108 mmol/L — ABNORMAL HIGH (ref 96–106)
GFR calc Af Amer: 97 mL/min/{1.73_m2} (ref >59)
GFR calc non Af Amer: 84 mL/min/{1.73_m2} (ref >59)
Glucose: 97 mg/dL (ref 65–99)
Potassium: 4.5 mmol/L (ref 3.5–5.2)
Sodium: 146 mmol/L — ABNORMAL HIGH (ref 134–144)

## 2018-03-03 MED ORDER — ALLOPURINOL 100 MG PO TABS: 100.0000 mg | ORAL_TABLET | Freq: Every day | ORAL | 6 refills | Status: DC

## 2018-03-04 ENCOUNTER — Encounter: Payer: Self-pay | Admitting: Pediatrics

## 2018-03-07 ENCOUNTER — Emergency Department (HOSPITAL_COMMUNITY)
Admission: EM | Admit: 2018-03-07 | Discharge: 2018-03-07 | Disposition: A | Discharge status: Home / Self Care | Payer: BLUE CROSS/BLUE SHIELD | Attending: Emergency Medicine | Admitting: Emergency Medicine

## 2018-03-07 ENCOUNTER — Emergency Department (HOSPITAL_COMMUNITY): Payer: BLUE CROSS/BLUE SHIELD

## 2018-03-07 ENCOUNTER — Other Ambulatory Visit: Payer: Self-pay

## 2018-03-07 ENCOUNTER — Encounter (HOSPITAL_COMMUNITY): Payer: Self-pay | Admitting: Emergency Medicine

## 2018-03-07 DIAGNOSIS — I10 Essential (primary) hypertension: Secondary | ICD-10-CM | POA: Diagnosis not present

## 2018-03-07 DIAGNOSIS — J449 Chronic obstructive pulmonary disease, unspecified: Secondary | ICD-10-CM | POA: Insufficient documentation

## 2018-03-07 DIAGNOSIS — M25462 Effusion, left knee: Secondary | ICD-10-CM | POA: Diagnosis not present

## 2018-03-07 DIAGNOSIS — F17210 Nicotine dependence, cigarettes, uncomplicated: Secondary | ICD-10-CM | POA: Diagnosis not present

## 2018-03-07 DIAGNOSIS — Z79899 Other long term (current) drug therapy: Secondary | ICD-10-CM | POA: Diagnosis not present

## 2018-03-07 DIAGNOSIS — M7652 Patellar tendinitis, left knee: Secondary | ICD-10-CM | POA: Diagnosis not present

## 2018-03-07 DIAGNOSIS — M25562 Pain in left knee: Secondary | ICD-10-CM | POA: Diagnosis not present

## 2018-03-07 LAB — CBC WITH DIFFERENTIAL/PLATELET
Basophils Absolute: 0 10*3/uL (ref 0.0–0.1)
Basophils Relative: 0 %
EOS PCT: 5 %
Eosinophils Absolute: 0.6 10*3/uL (ref 0.0–0.7)
HEMATOCRIT: 47.4 % (ref 39.0–52.0)
Hemoglobin: 16.2 g/dL (ref 13.0–17.0)
LYMPHS PCT: 20 %
Lymphs Abs: 2.3 10*3/uL (ref 0.7–4.0)
MCH: 32.8 pg (ref 26.0–34.0)
MCHC: 34.2 g/dL (ref 30.0–36.0)
MCV: 96 fL (ref 78.0–100.0)
MONO ABS: 1.1 10*3/uL — AB (ref 0.1–1.0)
MONOS PCT: 9 %
NEUTROS ABS: 7.3 10*3/uL (ref 1.7–7.7)
Neutrophils Relative %: 66 %
Platelets: 188 10*3/uL (ref 150–400)
RBC: 4.94 MIL/uL (ref 4.22–5.81)
RDW: 12.5 % (ref 11.5–15.5)
WBC: 11.2 10*3/uL — ABNORMAL HIGH (ref 4.0–10.5)

## 2018-03-07 LAB — BASIC METABOLIC PANEL
Anion gap: 7 (ref 5–15)
BUN: 19 mg/dL (ref 6–20)
CALCIUM: 8.9 mg/dL (ref 8.9–10.3)
CO2: 24 mmol/L (ref 22–32)
CREATININE: 1.14 mg/dL (ref 0.61–1.24)
Chloride: 111 mmol/L (ref 98–111)
GFR calc Af Amer: >60 mL/min (ref >60)
GFR calc non Af Amer: >60 mL/min (ref >60)
GLUCOSE: 115 mg/dL — AB (ref 70–99)
Potassium: 4 mmol/L (ref 3.5–5.1)
Sodium: 142 mmol/L (ref 135–145)

## 2018-03-07 MED ORDER — IBUPROFEN 800 MG PO TABS: 800.0000 mg | ORAL_TABLET | Freq: Three times a day (TID) | ORAL | 0 refills | Status: DC | PRN

## 2018-03-07 MED ORDER — KETOROLAC TROMETHAMINE 30 MG/ML IJ SOLN
60.0000 mg | Freq: Once | INTRAMUSCULAR | Status: AC
  Administered 2018-03-07: 60 mg via INTRAMUSCULAR
  Filled 2018-03-07: qty 2

## 2018-03-07 NOTE — ED Triage Notes (Addendum)
Patient c/o right knee pain and swelling that started Friday. Patient states pain became worse yesterday. Patient has warmth, redness, swelling, and tenderness to touch. Denies any known injury. Patient reports starting colchicine yesterday thinking it may be related to gout.

## 2018-03-07 NOTE — ED Notes (Signed)
Patient states the pain started 03/05/2018, started out with being "sore" and has progressed to a 10 today. Very painful to walk.

## 2018-03-07 NOTE — ED Provider Notes (Signed)
[facility] EMERGENCY DEPARTMENT Provider Note   CSN: [id number] Arrival date & time: 03/07/18  1320     History   Chief Complaint Chief Complaint  Patient presents with  . Knee Pain    HPI Chase Christensen is a 47 y.o. male.  He is complaining of left knee pain that started a couple of days ago.  Said it started mild would bother him but was able to work through it but starting last night he just became more severe and kept him up all night long.  He tried a leftover pain medicine that did not seem to help at all.  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  He does have a history of gout in his foot and tried some colchicine last night that did not seem to change it at all.  He is otherwise tried no anti-inflammatories.  No fevers no chills.  Says it hurts mostly to walk and is most comfortable position is when he is lying down with the leg slightly flexed with something underneath his knee.  The history is provided by the patient.  Knee Pain   This is a new problem. The current episode started 2 days ago. The problem occurs constantly. The problem has not changed since onset.The pain is present in the left knee. The quality of the pain is described as aching. The pain is severe. Associated symptoms include limited range of motion. Pertinent negatives include no numbness and no tingling. The symptoms are aggravated by activity. He has tried rest for the symptoms. There has been no history of extremity trauma. Family history is significant for gout.    Past Medical History:  Diagnosis Date  . COPD (chronic obstructive pulmonary disease) (HCC)   . ED (erectile dysfunction)   . Gout   . Hypertension   . Pneumonia   . Sleep apnea with use of continuous positive airway pressure (CPAP)     Patient Active Problem List   Diagnosis Date Noted  . Gastroesophageal reflux disease 08/27/2017  . Gout 07/17/2016  . Chest pain  07/13/2015  . Erectile dysfunction 06/08/2015  . Snoring 06/08/2015  . Essential hypertension 05/03/2015  . Fatigue 05/03/2015  . Tobacco use disorder 05/03/2015    Past Surgical History:  Procedure Laterality Date  . KNEE ARTHROSCOPY Right 03/15/2015  . WISDOM TOOTH EXTRACTION          Home Medications    Prior to Admission medications   Medication Sig Start Date End Date Taking? Authorizing Provider  albuterol (PROVENTIL HFA;VENTOLIN HFA) 108 (90 Base) MCG/ACT inhaler TAKE 2 PUFFS BY MOUTH EVERY 6 HOURS AS NEEDED FOR WHEEZE OR SHORTNESS OF BREATH 09/02/17   Elenora Gamma, MD  allopurinol (ZYLOPRIM) 100 MG tablet Take 1 tablet (100 mg total) by mouth daily. 03/03/18   Johna Sheriff, MD  amLODipine (NORVASC) 5 MG tablet Take 1 tablet (5 mg total) by mouth daily. 01/25/18   Elenora Gamma, MD  colchicine 0.6 MG tablet Take 2 pills at first sight of flare, repeat 1 pill 1 hour later, then daily for 4-5 days 01/13/18   Johna Sheriff, MD  lisinopril (PRINIVIL,ZESTRIL) 40 MG tablet Take 1 tablet (40 mg total) by mouth daily. 01/25/18   Elenora Gamma, MD  ondansetron (ZOFRAN ODT) 4 MG disintegrating tablet Take 1 tablet (4 mg total) by mouth every 8 (eight) hours as needed for  nausea or vomiting. 08/14/17   Elson Areas, PA-C  pantoprazole (PROTONIX) 40 MG tablet Take 1 tablet (40 mg total) by mouth daily before breakfast. 08/27/17   Elenora Gamma, MD  sildenafil (REVATIO) 20 MG tablet Take 1-5 tablets (20-100 mg total) by mouth daily as needed. 03/01/18   Dettinger, Elige Radon, MD    Family History Family History  Problem Relation Age of Onset  . Hypertension Mother   . Diabetes Maternal Grandmother   . Lung cancer Maternal Grandfather   . Pancreatic cancer Paternal Grandmother   . Colon cancer Paternal Uncle   . Colon polyps Cousin   . Cancer Other        all paternal uncles and anunts passed with cancer    Social History Social History   Tobacco Use  .  Smoking status: Current Every Day Smoker    Packs/day: 0.25    Types: Cigarettes  . Smokeless tobacco: Former Neurosurgeon    Types: Chew  Substance Use Topics  . Alcohol use: Yes    Comment: occas  . Drug use: No     Allergies   Penicillins   Review of Systems Review of Systems  Constitutional: Negative for fever.  HENT: Negative for sore throat.   Eyes: Negative for visual disturbance.  Respiratory: Negative for shortness of breath.   Cardiovascular: Negative for chest pain.  Gastrointestinal: Negative for abdominal pain.  Genitourinary: Negative for dysuria.  Musculoskeletal: Negative for neck pain.  Skin: Negative for wound.  Neurological: Negative for tingling and numbness.     Physical Exam Updated Vital Signs BP (!) 122/92 (BP Location: Right Arm)   Pulse 74   Temp 97.8 F (36.6 C) (Oral)   Resp 16   Ht 6\' 3"  (1.905 m)   Wt 117.9 kg   SpO2 95%   BMI 32.50 kg/m   Physical Exam  Constitutional: He is oriented to person, place, and time. He appears well-developed and well-nourished.  HENT:  Head: Normocephalic and atraumatic.  Eyes: Conjunctivae are normal.  Neck: Neck supple.  Cardiovascular: Normal rate, regular rhythm and normal heart sounds.  Pulmonary/Chest: Effort normal. No stridor. He has no wheezes.  Abdominal: Soft. There is no tenderness. There is no guarding.  Musculoskeletal: He exhibits tenderness. He exhibits no deformity.  He is got no significant patellar tenderness nor joint line tenderness.  He is tender distal to the kneecap through the patellar tendon and over the tendon insertion.  This area of the insertion has some surrounding erythema and warmth.  There is no obvious wounds.  His pain is reproduced by any firing of his extensor mechanism.  Neurological: He is alert and oriented to person, place, and time. GCS eye subscore is 4. GCS verbal subscore is 5. GCS motor subscore is 6.  Skin: Skin is warm and dry.  Psychiatric: He has a normal  mood and affect.  Nursing note and vitals reviewed.    ED Treatments / Results  Labs (all labs ordered are listed, but only abnormal results are displayed) Labs Reviewed  CBC WITH DIFFERENTIAL/PLATELET - Abnormal; Notable for the following components:      Result Value   WBC 11.2 (*)    Monocytes Absolute 1.1 (*)    All other components within normal limits  BASIC METABOLIC PANEL - Abnormal; Notable for the following components:   Glucose, Bld 115 (*)    All other components within normal limits    EKG None  Radiology Dg Knee Complete 4  Views Left  Result Date: 03/07/2018 CLINICAL DATA:  Onset severe left knee pain last night inferior to the patella. No known injury. EXAM: LEFT KNEE - COMPLETE 4+ VIEW COMPARISON:  None. FINDINGS: No evidence of fracture or dislocation. Small to moderate joint effusion noted. No evidence of arthropathy or other focal bone abnormality. Soft tissues are unremarkable. IMPRESSION: Small to moderate knee joint effusion.  Otherwise negative. Electronically Signed   By: Drusilla Kanner M.D.   On: 03/07/2018 14:11    Procedures Procedures (including critical care time)  Medications Ordered in ED Medications  ketorolac (TORADOL) 30 MG/ML injection 60 mg (has no administration in time range)     Initial Impression / Assessment and Plan / ED Course  I have reviewed the triage vital signs and the nursing notes.  Pertinent labs & imaging results that were available during my care of the patient were reviewed by me and considered in my medical decision making (see chart for details).  Clinical Course as of Mar 08 1943  Wynelle Link Mar 07, 2018  1516 Patient with atraumatic left knee pain but truly more distal to the knee.  It looks like he is got a tendinitis of the patellar tendon.  I have offered him a knee immobilizer which he is declined.  We are going to give him a shot of some Toradol here and send him home with ibuprofen and have him ice the area.  We did  recommend he follow-up with orthopedics and potentially get an injection if this continues to be a problem.   [MB]    Clinical Course User Index [MB] Terrilee Files, MD     Final Clinical Impressions(s) / ED Diagnoses   Final diagnoses:  Patellar tendonitis of left knee    ED Discharge Orders         Ordered    ibuprofen (ADVIL,MOTRIN) 800 MG tablet  Every 8 hours PRN     03/07/18 1517           Terrilee Files, MD 03/07/18 1944

## 2018-03-07 NOTE — Discharge Instructions (Addendum)
Your evaluated in the emergency department for some left knee pain.  You had knee x-rays that did not show an obvious fracture.  I think you most likely have a patellar tendinitis which is inflammation and that tendon that helps extend your lower leg.  This will benefit from anti-inflammatories like ibuprofen and some ice locally to the area.  You will also benefit from rest.  Please follow-up with orthopedics if this continues to trouble you.

## 2018-03-31 ENCOUNTER — Encounter: Payer: Self-pay | Admitting: Physician Assistant

## 2018-03-31 ENCOUNTER — Ambulatory Visit: Payer: BLUE CROSS/BLUE SHIELD | Admitting: Physician Assistant

## 2018-03-31 VITALS — BP 121/90 | HR 77 | Ht 75.0 in | Wt 257.0 lb

## 2018-03-31 DIAGNOSIS — J40 Bronchitis, not specified as acute or chronic: Secondary | ICD-10-CM

## 2018-03-31 DIAGNOSIS — J0101 Acute recurrent maxillary sinusitis: Secondary | ICD-10-CM | POA: Diagnosis not present

## 2018-03-31 MED ORDER — METHYLPREDNISOLONE ACETATE 80 MG/ML IJ SUSP
80.0000 mg | Freq: Once | INTRAMUSCULAR | Status: AC
  Administered 2018-03-31: 80 mg via INTRAMUSCULAR

## 2018-03-31 MED ORDER — LEVOFLOXACIN 500 MG PO TABS: 500.0000 mg | ORAL_TABLET | Freq: Every day | ORAL | 0 refills | Status: DC

## 2018-03-31 MED ORDER — BENZONATATE 200 MG PO CAPS: 200.0000 mg | ORAL_CAPSULE | Freq: Two times a day (BID) | ORAL | 0 refills | Status: DC | PRN

## 2018-03-31 MED ORDER — HYDROCODONE-HOMATROPINE 5-1.5 MG/5ML PO SYRP: 5.0000 mL | ORAL_SOLUTION | Freq: Four times a day (QID) | ORAL | 0 refills | Status: DC | PRN

## 2018-03-31 NOTE — Addendum Note (Signed)
Addended by: Bernadene Bell on: 03/31/2018 09:48 AM   Modules accepted: Orders

## 2018-03-31 NOTE — Progress Notes (Signed)
BP 121/90   Pulse 77   Ht 6\' 3"  (1.905 m)   Wt 257 lb (116.6 kg)   BMI 32.12 kg/m    Subjective:    Patient ID: Chase Christensen, male    DOB: 10-20-1970, 47 y.o.   MRN: [id number]  HPI: KEDAR EISLEY is a 47 y.o. male presenting on 03/31/2018 for Cough  Patient with several days of progressing upper respiratory and bronchial symptoms. Initially there was more upper respiratory congestion. This progressed to having significant cough that is productive throughout the day and severe at night. There is occasional wheezing after coughing. Sometimes there is slight dyspnea on exertion. It is productive mucus that is yellow in color. Denies any blood.  Past Medical History:  Diagnosis Date  . COPD (chronic obstructive pulmonary disease) (HCC)   . ED (erectile dysfunction)   . Gout   . Hypertension   . Pneumonia   . Sleep apnea with use of continuous positive airway pressure (CPAP)    Relevant past medical, surgical, family and social history reviewed and updated as indicated. Interim medical history since our last visit reviewed. Allergies and medications reviewed and updated. DATA REVIEWED: CHART IN EPIC  Family History reviewed for pertinent findings.  Review of Systems  Constitutional: Positive for fatigue. Negative for appetite change and fever.  HENT: Positive for congestion, postnasal drip, sinus pressure, sinus pain and sore throat.   Eyes: Negative.  Negative for pain and visual disturbance.  Respiratory: Positive for cough, shortness of breath and wheezing. Negative for chest tightness.   Cardiovascular: Negative.  Negative for chest pain, palpitations and leg swelling.  Gastrointestinal: Negative.  Negative for abdominal pain, diarrhea, nausea and vomiting.  Endocrine: Negative.   Genitourinary: Negative.   Musculoskeletal: Positive for back pain and myalgias.  Skin: Negative.  Negative for color change and rash.  Neurological: Positive for headaches. Negative for  weakness and numbness.  Psychiatric/Behavioral: Negative.     Allergies as of 03/31/2018      Reactions   Penicillins Rash      Medication List        Accurate as of 03/31/18  9:10 AM. Always use your most recent med list.          albuterol 108 (90 Base) MCG/ACT inhaler Commonly known as:  PROVENTIL HFA;VENTOLIN HFA TAKE 2 PUFFS BY MOUTH EVERY 6 HOURS AS NEEDED FOR WHEEZE OR SHORTNESS OF BREATH   allopurinol 100 MG tablet Commonly known as:  ZYLOPRIM Take 1 tablet (100 mg total) by mouth daily.   amLODipine 5 MG tablet Commonly known as:  NORVASC Take 1 tablet (5 mg total) by mouth daily.   benzonatate 200 MG capsule Commonly known as:  TESSALON Take 1 capsule (200 mg total) by mouth 2 (two) times daily as needed for cough.   colchicine 0.6 MG tablet Take 2 pills at first sight of flare, repeat 1 pill 1 hour later, then daily for 4-5 days   HYDROcodone-homatropine 5-1.5 MG/5ML syrup Commonly known as:  HYCODAN Take 5-10 mLs by mouth every 6 (six) hours as needed.   ibuprofen 800 MG tablet Commonly known as:  ADVIL,MOTRIN Take 1 tablet (800 mg total) by mouth every 8 (eight) hours as needed.   levofloxacin 500 MG tablet Commonly known as:  LEVAQUIN Take 1 tablet (500 mg total) by mouth daily.   lisinopril 40 MG tablet Commonly known as:  PRINIVIL,ZESTRIL Take 1 tablet (40 mg total) by mouth daily.   ondansetron 4 MG  disintegrating tablet Commonly known as:  ZOFRAN-ODT Take 1 tablet (4 mg total) by mouth every 8 (eight) hours as needed for nausea or vomiting.   pantoprazole 40 MG tablet Commonly known as:  PROTONIX Take 1 tablet (40 mg total) by mouth daily before breakfast.   sildenafil 20 MG tablet Commonly known as:  REVATIO Take 1-5 tablets (20-100 mg total) by mouth daily as needed.          Objective:    BP 121/90   Pulse 77   Ht 6\' 3"  (1.905 m)   Wt 257 lb (116.6 kg)   BMI 32.12 kg/m   Allergies  Allergen Reactions  . Penicillins Rash      Wt Readings from Last 3 Encounters:  03/31/18 257 lb (116.6 kg)  03/07/18 260 lb (117.9 kg)  03/02/18 263 lb 9.6 oz (119.6 kg)    Physical Exam  Constitutional: He appears well-developed and well-nourished.  HENT:  Head: Normocephalic and atraumatic.  Right Ear: Hearing and tympanic membrane normal.  Left Ear: Hearing and tympanic membrane normal.  Nose: Mucosal edema and sinus tenderness present. No nasal deformity. Right sinus exhibits frontal sinus tenderness. Left sinus exhibits frontal sinus tenderness.  Mouth/Throat: Posterior oropharyngeal erythema present.  Eyes: Pupils are equal, round, and reactive to light. Conjunctivae and EOM are normal. Right eye exhibits no discharge. Left eye exhibits no discharge.  Neck: Normal range of motion. Neck supple.  Cardiovascular: Normal rate, regular rhythm and normal heart sounds.  Pulmonary/Chest: Effort normal. No respiratory distress. He has no decreased breath sounds. He has wheezes. He has no rhonchi. He has no rales.  Abdominal: Soft. Bowel sounds are normal.  Musculoskeletal: Normal range of motion.  Skin: Skin is warm and dry.        Assessment & Plan:   1. Bronchitis - levofloxacin (LEVAQUIN) 500 MG tablet; Take 1 tablet (500 mg total) by mouth daily.  Dispense: 10 tablet; Refill: 0 - HYDROcodone-homatropine (HYCODAN) 5-1.5 MG/5ML syrup; Take 5-10 mLs by mouth every 6 (six) hours as needed.  Dispense: 240 mL; Refill: 0 - benzonatate (TESSALON) 200 MG capsule; Take 1 capsule (200 mg total) by mouth 2 (two) times daily as needed for cough.  Dispense: 30 capsule; Refill: 0  2. Acute recurrent maxillary sinusitis - levofloxacin (LEVAQUIN) 500 MG tablet; Take 1 tablet (500 mg total) by mouth daily.  Dispense: 10 tablet; Refill: 0 - HYDROcodone-homatropine (HYCODAN) 5-1.5 MG/5ML syrup; Take 5-10 mLs by mouth every 6 (six) hours as needed.  Dispense: 240 mL; Refill: 0 - benzonatate (TESSALON) 200 MG capsule; Take 1 capsule (200  mg total) by mouth 2 (two) times daily as needed for cough.  Dispense: 30 capsule; Refill: 0   Continue all other maintenance medications as listed above.  Follow up plan: No follow-ups on file.  Educational handout given for survey  Remus Loffler PA-C [facility] [address]  [place], [place] [address] [phone]   03/31/2018, 9:10 AM

## 2018-05-29 ENCOUNTER — Other Ambulatory Visit: Payer: Self-pay | Admitting: Family Medicine

## 2018-06-02 ENCOUNTER — Ambulatory Visit: Payer: BLUE CROSS/BLUE SHIELD | Admitting: Pediatrics

## 2018-06-11 ENCOUNTER — Ambulatory Visit: Payer: BLUE CROSS/BLUE SHIELD | Admitting: Pediatrics

## 2018-06-22 ENCOUNTER — Encounter: Payer: Self-pay | Admitting: Pediatrics

## 2018-08-14 ENCOUNTER — Other Ambulatory Visit: Payer: Self-pay | Admitting: Family Medicine

## 2018-09-03 ENCOUNTER — Other Ambulatory Visit: Payer: Self-pay | Admitting: Physician Assistant

## 2018-09-30 ENCOUNTER — Other Ambulatory Visit: Payer: Self-pay | Admitting: Physician Assistant

## 2018-10-01 ENCOUNTER — Ambulatory Visit (INDEPENDENT_AMBULATORY_CARE_PROVIDER_SITE_OTHER): Payer: BLUE CROSS/BLUE SHIELD | Admitting: Physician Assistant

## 2018-10-01 ENCOUNTER — Other Ambulatory Visit: Payer: Self-pay

## 2018-10-01 DIAGNOSIS — I10 Essential (primary) hypertension: Secondary | ICD-10-CM | POA: Diagnosis not present

## 2018-10-01 DIAGNOSIS — E790 Hyperuricemia without signs of inflammatory arthritis and tophaceous disease: Secondary | ICD-10-CM

## 2018-10-01 DIAGNOSIS — M109 Gout, unspecified: Secondary | ICD-10-CM | POA: Diagnosis not present

## 2018-10-01 DIAGNOSIS — K219 Gastro-esophageal reflux disease without esophagitis: Secondary | ICD-10-CM | POA: Diagnosis not present

## 2018-10-01 DIAGNOSIS — J9801 Acute bronchospasm: Secondary | ICD-10-CM

## 2018-10-01 MED ORDER — PANTOPRAZOLE SODIUM 40 MG PO TBEC: 40.0000 mg | DELAYED_RELEASE_TABLET | Freq: Every day | ORAL | 3 refills | Status: DC

## 2018-10-01 MED ORDER — AMLODIPINE BESYLATE 5 MG PO TABS: 5.0000 mg | ORAL_TABLET | Freq: Every day | ORAL | 3 refills | Status: DC

## 2018-10-01 MED ORDER — COLCHICINE 0.6 MG PO TABS: ORAL_TABLET | ORAL | 1 refills | Status: DC

## 2018-10-01 MED ORDER — LISINOPRIL 40 MG PO TABS: 40.0000 mg | ORAL_TABLET | Freq: Every day | ORAL | 3 refills | Status: DC

## 2018-10-01 MED ORDER — ALLOPURINOL 100 MG PO TABS: 100.0000 mg | ORAL_TABLET | Freq: Every day | ORAL | 3 refills | Status: DC

## 2018-10-01 MED ORDER — SILDENAFIL CITRATE 20 MG PO TABS: ORAL_TABLET | ORAL | 3 refills | Status: DC

## 2018-10-01 MED ORDER — ALBUTEROL SULFATE HFA 108 (90 BASE) MCG/ACT IN AERS: INHALATION_SPRAY | RESPIRATORY_TRACT | 5 refills | Status: DC

## 2018-10-01 NOTE — Progress Notes (Signed)
Telephone visit  Subjective: OM:BTDHRCB recheck on conditions PCP: Remus Loffler, PA-C ULA:GTXMIW SHEDRIC Chase Christensen is a 48 y.o. male calls for telephone consult today. Patient provides verbal consent for consult held via phone.  Location of patient: home Location of provider: [facility] Others present for call: no  This patient comes in for periodic recheck on medications and conditions including gout, hypertension, GERD, reactive airway.  He reports overall he is not having any significant flareups of his gout or bronchospasm with cough.  He has not had any recent infections.  He states that he is doing really well overall and just states all of his medications refilled.  We will have him come in for labs in a few weeks.  All medications are reviewed today. There are no reports of any problems with the medications. All of the medical conditions are reviewed and updated.  Lab work is reviewed and will be ordered as medically necessary. There are no new problems reported with today's visit.    ROS: Per HPI  Allergies  Allergen Reactions  . Penicillins Rash   Past Medical History:  Diagnosis Date  . COPD (chronic obstructive pulmonary disease) (HCC)   . ED (erectile dysfunction)   . Gout   . Hypertension   . Pneumonia   . Sleep apnea with use of continuous positive airway pressure (CPAP)     Current Outpatient Medications:  .  albuterol (PROVENTIL HFA;VENTOLIN HFA) 108 (90 Base) MCG/ACT inhaler, TAKE 2 PUFFS BY MOUTH EVERY 6 HOURS AS NEEDED FOR WHEEZE OR SHORTNESS OF BREATH, Disp: 8.5 Inhaler, Rfl: 5 .  allopurinol (ZYLOPRIM) 100 MG tablet, Take 1 tablet (100 mg total) by mouth daily., Disp: 30 tablet, Rfl: 6 .  amLODipine (NORVASC) 5 MG tablet, Take 1 tablet (5 mg total) by mouth daily., Disp: 90 tablet, Rfl: 3 .  benzonatate (TESSALON) 200 MG capsule, Take 1 capsule (200 mg total) by mouth 2 (two) times daily as needed for cough., Disp: 30 capsule, Rfl: 0 .  colchicine 0.6 MG tablet, Take  2 pills at first sight of flare, repeat 1 pill 1 hour later, then daily for 4-5 days, Disp: 30 tablet, Rfl: 1 .  HYDROcodone-homatropine (HYCODAN) 5-1.5 MG/5ML syrup, Take 5-10 mLs by mouth every 6 (six) hours as needed., Disp: 240 mL, Rfl: 0 .  ibuprofen (ADVIL,MOTRIN) 800 MG tablet, Take 1 tablet (800 mg total) by mouth every 8 (eight) hours as needed., Disp: 21 tablet, Rfl: 0 .  levofloxacin (LEVAQUIN) 500 MG tablet, Take 1 tablet (500 mg total) by mouth daily., Disp: 10 tablet, Rfl: 0 .  lisinopril (PRINIVIL,ZESTRIL) 40 MG tablet, Take 1 tablet (40 mg total) by mouth daily., Disp: 90 tablet, Rfl: 3 .  ondansetron (ZOFRAN ODT) 4 MG disintegrating tablet, Take 1 tablet (4 mg total) by mouth every 8 (eight) hours as needed for nausea or vomiting., Disp: 20 tablet, Rfl: 0 .  pantoprazole (PROTONIX) 40 MG tablet, Take 1 tablet (40 mg total) by mouth daily before breakfast., Disp: 30 tablet, Rfl: 0 .  sildenafil (REVATIO) 20 MG tablet, TAKE 1-5 TABLETS DAILY AS NEEDED, Disp: 50 tablet, Rfl: 0  Assessment/ Plan: 48 y.o. male   1. Acute gout of right ankle, unspecified cause - allopurinol (ZYLOPRIM) 100 MG tablet; Take 1 tablet (100 mg total) by mouth daily.  Dispense: 90 tablet; Refill: 3 - colchicine 0.6 MG tablet; Take 2 pills at first sight of flare, repeat 1 pill 1 hour later, then daily for 4-5 days  Dispense: 30 tablet; Refill: 1  2. Elevated blood uric acid level - allopurinol (ZYLOPRIM) 100 MG tablet; Take 1 tablet (100 mg total) by mouth daily.  Dispense: 90 tablet; Refill: 3 - colchicine 0.6 MG tablet; Take 2 pills at first sight of flare, repeat 1 pill 1 hour later, then daily for 4-5 days  Dispense: 30 tablet; Refill: 1  3. Essential hypertension - amLODipine (NORVASC) 5 MG tablet; Take 1 tablet (5 mg total) by mouth daily.  Dispense: 90 tablet; Refill: 3 - lisinopril (PRINIVIL,ZESTRIL) 40 MG tablet; Take 1 tablet (40 mg total) by mouth daily.  Dispense: 90 tablet; Refill: 3  4.  Gastroesophageal reflux disease without esophagitis - pantoprazole (PROTONIX) 40 MG tablet; Take 1 tablet (40 mg total) by mouth daily before breakfast.  Dispense: 90 tablet; Refill: 3  5. Bronchospasm - albuterol (PROVENTIL HFA;VENTOLIN HFA) 108 (90 Base) MCG/ACT inhaler; TAKE 2 PUFFS BY MOUTH EVERY 6 HOURS AS NEEDED FOR WHEEZE OR SHORTNESS OF BREATH  Dispense: 8.5 Inhaler; Refill: 5   Start time: 8:20 End time: 8;27  No orders of the defined types were placed in this encounter.   Prudy Feeler PA-C [facility] [phone]

## 2018-10-03 ENCOUNTER — Encounter: Payer: Self-pay | Admitting: Physician Assistant

## 2018-10-28 ENCOUNTER — Other Ambulatory Visit: Payer: Self-pay | Admitting: Physician Assistant

## 2018-10-28 DIAGNOSIS — M109 Gout, unspecified: Secondary | ICD-10-CM

## 2018-10-28 DIAGNOSIS — E790 Hyperuricemia without signs of inflammatory arthritis and tophaceous disease: Secondary | ICD-10-CM

## 2018-11-23 ENCOUNTER — Ambulatory Visit (INDEPENDENT_AMBULATORY_CARE_PROVIDER_SITE_OTHER): Payer: BLUE CROSS/BLUE SHIELD | Admitting: Physician Assistant

## 2018-11-23 ENCOUNTER — Encounter: Payer: Self-pay | Admitting: Physician Assistant

## 2018-11-23 ENCOUNTER — Other Ambulatory Visit: Payer: Self-pay

## 2018-11-23 DIAGNOSIS — S30861A Insect bite (nonvenomous) of abdominal wall, initial encounter: Secondary | ICD-10-CM | POA: Diagnosis not present

## 2018-11-23 DIAGNOSIS — L03311 Cellulitis of abdominal wall: Secondary | ICD-10-CM | POA: Diagnosis not present

## 2018-11-23 DIAGNOSIS — W57XXXA Bitten or stung by nonvenomous insect and other nonvenomous arthropods, initial encounter: Secondary | ICD-10-CM | POA: Diagnosis not present

## 2018-11-23 DIAGNOSIS — R5383 Other fatigue: Secondary | ICD-10-CM

## 2018-11-23 MED ORDER — DOXYCYCLINE HYCLATE 100 MG PO TABS: 100.0000 mg | ORAL_TABLET | Freq: Two times a day (BID) | ORAL | 0 refills | Status: DC

## 2018-11-23 NOTE — Patient Instructions (Signed)
Lyme Disease  Lyme disease is an infection that affects many parts of the body, including the skin, joints, and nervous system. It is a bacterial infection that starts from the bite of an infected tick. The infection can spread, and some of the symptoms are similar to the flu. If Lyme disease is not treated, it may cause joint pain, swelling, numbness, problems thinking, fatigue, muscle weakness, and other problems. What are the causes? This condition is caused by bacteria called Borrelia burgdorferi. You can get Lyme disease by being bitten by an infected tick. The tick must be attached to your skin to pass along the infection. Deer often carry infected ticks. What increases the risk? The following factors may make you more likely to develop this condition:  Living in or visiting these areas in the [place] ? [place]. ? [place]. ? [place].  Spending time in wooded or grassy areas.  Being outdoors with exposed skin.  Camping, gardening, hiking, fishing, or hunting outdoors.  Failing to remove a tick from your skin within 3-4 days. What are the signs or symptoms? Symptoms of this condition include:  A round, red rash that surrounds the center of the tick bite. This is the first sign of infection. The center of the rash may be blood colored or have tiny blisters.  Fatigue.  Headache.  Chills and fever.  General achiness.  Joint pain, often in the knees.  Muscle pain.  Swollen lymph glands.  Stiff neck. How is this diagnosed? This condition is diagnosed based on:  Your symptoms and medical history.  A physical exam.  A blood test. How is this treated? The main treatment for this condition is antibiotic medicine, which is usually taken by mouth (orally). The length of treatment depends on how soon after a tick bite you begin taking the medicine. In some cases, treatment is necessary for several weeks. If the infection is severe, antibiotics may  need to be given through an IV tube that is inserted into one of your veins. Follow these instructions at home:  Take your antibiotic medicine as told by your health care provider. Do not stop taking the antibiotic even if you start to feel better.  Ask your health care provider about takinga probiotic in between doses of your antibiotic to help avoid stomach upset or diarrhea.  Check with your health care provider before supplementing your treatment. Many alternative therapies have not been proven and may be harmful to you.  Keep all follow-up visits as told by your health care provider. This is important. How is this prevented? You can become reinfected if you get another tick bite from an infected tick. Take these steps to help prevent an infection:  Cover your skin with light-colored clothing when you are outdoors in the spring and summer months.  Spray clothing and skin with bug spray. The spray should be 20-30% DEET.  Avoid wooded, grassy, and shaded areas.  Remove yard litter, brush, trash, and plants that attract deer and rodents.  Check yourself for ticks when you come indoors.  Wash clothing worn each day.  Check your pets for ticks before they come inside.  If you find a tick: ? Remove it with tweezers. ? Clean your hands and the bite area with rubbing alcohol or soap and water. Pregnant women should take special care to avoid tick bites because the infection can be passed along to the fetus. Contact a health care provider if:  You have symptoms   after treatment.  You have removed a tick and want to bring it to your health care provider for testing. Get help right away if:  You have an irregular heartbeat.  You have nerve pain.  Your face feels numb. This information is not intended to replace advice given to you by your health care provider. Make sure you discuss any questions you have with your health care provider. Document Released: 09/29/2000 Document  Revised: 02/12/2016 Document Reviewed: 02/12/2016 Elsevier Interactive Patient Education  2019 [employer] Inc[employer]  

## 2018-11-23 NOTE — Progress Notes (Signed)
Telephone visit  Subjective: ZO:XWRUCC:tick bite PCP: Remus LofflerJones, Arch Methot S, PA-C EAV:[id number]:Tobias Orvis BrillG Penafiel is a 48 y.o. male calls for telephone consult today. Patient provides verbal consent for consult held via phone.  Patient is identified with 2 separate identifiers.  At this time the entire area is on COVID-19 social distancing and stay home orders are in place.  Patient is of higher risk and therefore we are performing this by a virtual method.  Location of patient: home Location of provider: [facility] Others present for call: no  4 days ago the patient got a tick off it was very small on his abdomen.  He has been scratching the area for 4 to 5 days prior to that.  Since then he has developed a large red welt around the area.  It is itching, hot.  He states he is having some headache and low energy.  He denies any fever.  There is no other rash on his body.   ROS: Per HPI  Allergies  Allergen Reactions  . Penicillins Rash   Past Medical History:  Diagnosis Date  . COPD (chronic obstructive pulmonary disease) (HCC)   . ED (erectile dysfunction)   . Gout   . Hypertension   . Pneumonia   . Sleep apnea with use of continuous positive airway pressure (CPAP)     Current Outpatient Medications:  .  albuterol (PROVENTIL HFA;VENTOLIN HFA) 108 (90 Base) MCG/ACT inhaler, TAKE 2 PUFFS BY MOUTH EVERY 6 HOURS AS NEEDED FOR WHEEZE OR SHORTNESS OF BREATH, Disp: 8.5 Inhaler, Rfl: 5 .  allopurinol (ZYLOPRIM) 100 MG tablet, Take 1 tablet (100 mg total) by mouth daily., Disp: 90 tablet, Rfl: 3 .  amLODipine (NORVASC) 5 MG tablet, Take 1 tablet (5 mg total) by mouth daily., Disp: 90 tablet, Rfl: 3 .  colchicine 0.6 MG tablet, TAKE 2 TABLETS AT FIRST SIGN OF FLARE, REPEAT 1 HOUR LATER, THEN TAKE 1 TAB DAILY FOR 4 TO 5 DAYS, Disp: 30 tablet, Rfl: 0 .  doxycycline (VIBRA-TABS) 100 MG tablet, Take 1 tablet (100 mg total) by mouth 2 (two) times daily. 1 po bid, Disp: 20 tablet, Rfl: 0 .  ibuprofen (ADVIL,MOTRIN)  800 MG tablet, Take 1 tablet (800 mg total) by mouth every 8 (eight) hours as needed., Disp: 21 tablet, Rfl: 0 .  lisinopril (PRINIVIL,ZESTRIL) 40 MG tablet, Take 1 tablet (40 mg total) by mouth daily., Disp: 90 tablet, Rfl: 3 .  ondansetron (ZOFRAN ODT) 4 MG disintegrating tablet, Take 1 tablet (4 mg total) by mouth every 8 (eight) hours as needed for nausea or vomiting., Disp: 20 tablet, Rfl: 0 .  pantoprazole (PROTONIX) 40 MG tablet, Take 1 tablet (40 mg total) by mouth daily before breakfast., Disp: 90 tablet, Rfl: 3 .  sildenafil (REVATIO) 20 MG tablet, TAKE 1-5 TABLETS DAILY AS NEEDED, Disp: 50 tablet, Rfl: 3  Assessment/ Plan: 48 y.o. male   1. Tick bite of abdomen, initial encounter - doxycycline (VIBRA-TABS) 100 MG tablet; Take 1 tablet (100 mg total) by mouth 2 (two) times daily. 1 po bid  Dispense: 20 tablet; Refill: 0  2. Other fatigue - doxycycline (VIBRA-TABS) 100 MG tablet; Take 1 tablet (100 mg total) by mouth 2 (two) times daily. 1 po bid  Dispense: 20 tablet; Refill: 0  3. Cellulitis of abdominal wall - doxycycline (VIBRA-TABS) 100 MG tablet; Take 1 tablet (100 mg total) by mouth 2 (two) times daily. 1 po bid  Dispense: 20 tablet; Refill: 0  Start time: 11:48 AM End time: 11:58 AM  Meds ordered this encounter  Medications  . doxycycline (VIBRA-TABS) 100 MG tablet    Sig: Take 1 tablet (100 mg total) by mouth 2 (two) times daily. 1 po bid    Dispense:  20 tablet    Refill:  0    Order Specific Question:   Supervising Provider    Answer:   Raliegh Ip [[id number]]    Prudy Feeler PA-C [facility] [phone]

## 2019-03-07 ENCOUNTER — Other Ambulatory Visit: Payer: Self-pay

## 2019-03-08 ENCOUNTER — Ambulatory Visit (INDEPENDENT_AMBULATORY_CARE_PROVIDER_SITE_OTHER): Payer: BC Managed Care – PPO | Admitting: Family Medicine

## 2019-03-08 ENCOUNTER — Encounter: Payer: Self-pay | Admitting: *Deleted

## 2019-03-08 ENCOUNTER — Encounter: Payer: Self-pay | Admitting: Family Medicine

## 2019-03-08 ENCOUNTER — Other Ambulatory Visit: Payer: Self-pay | Admitting: Family Medicine

## 2019-03-08 VITALS — BP 123/87 | HR 72 | Temp 99.8°F | Ht 75.0 in | Wt 265.0 lb

## 2019-03-08 DIAGNOSIS — M10021 Idiopathic gout, right elbow: Secondary | ICD-10-CM

## 2019-03-08 DIAGNOSIS — M10022 Idiopathic gout, left elbow: Secondary | ICD-10-CM | POA: Diagnosis not present

## 2019-03-08 DIAGNOSIS — E790 Hyperuricemia without signs of inflammatory arthritis and tophaceous disease: Secondary | ICD-10-CM | POA: Diagnosis not present

## 2019-03-08 DIAGNOSIS — R238 Other skin changes: Secondary | ICD-10-CM

## 2019-03-08 DIAGNOSIS — M25429 Effusion, unspecified elbow: Secondary | ICD-10-CM

## 2019-03-08 DIAGNOSIS — H6123 Impacted cerumen, bilateral: Secondary | ICD-10-CM

## 2019-03-08 MED ORDER — COLCHICINE 0.6 MG PO TABS: ORAL_TABLET | ORAL | 0 refills | Status: DC

## 2019-03-08 MED ORDER — TRAMADOL HCL 50 MG PO TABS: 50.0000 mg | ORAL_TABLET | Freq: Three times a day (TID) | ORAL | 0 refills | Status: AC | PRN

## 2019-03-08 NOTE — Patient Instructions (Signed)
Gout  Gout is painful swelling of your joints. Gout is a type of arthritis. It is caused by having too much uric acid in your body. Uric acid is a chemical that is made when your body breaks down substances called purines. If your body has too much uric acid, Montesinos crystals can form and build up in your joints. This causes pain and swelling. Gout attacks can happen quickly and be very painful (acute gout). Over time, the attacks can affect more joints and happen more often (chronic gout). What are the causes?  Too much uric acid in your blood. This can happen because: ? Your kidneys do not remove enough uric acid from your blood. ? Your body makes too much uric acid. ? You eat too many foods that are high in purines. These foods include organ meats, some seafood, and beer.  Trauma or stress. What increases the risk?  Having a family history of gout.  Being male and middle-aged.  Being male and having gone through menopause.  Being very overweight (obese).  Drinking alcohol, especially beer.  Not having enough water in the body (being dehydrated).  Losing weight too quickly.  Having an organ transplant.  Having lead poisoning.  Taking certain medicines.  Having kidney disease.  Having a skin condition called psoriasis. What are the signs or symptoms? An attack of acute gout usually happens in just one joint. The most common place is the big toe. Attacks often start at night. Other joints that may be affected include joints of the feet, ankle, knee, fingers, wrist, or elbow. Symptoms of an attack may include:  Very bad pain.  Warmth.  Swelling.  Stiffness.  Shiny, red, or purple skin.  Tenderness. The affected joint may be very painful to touch.  Chills and fever. Chronic gout may cause symptoms more often. More joints may be involved. You may also have white or yellow lumps (tophi) on your hands or feet or in other areas near your joints. How is this  treated?  Treatment for this condition has two phases: treating an acute attack and preventing future attacks.  Acute gout treatment may include: ? NSAIDs. ? Steroids. These are taken by mouth or injected into a joint. ? Colchicine. This medicine relieves pain and swelling. It can be given by mouth or through an IV tube.  Preventive treatment may include: ? Taking small doses of NSAIDs or colchicine daily. ? Using a medicine that reduces uric acid levels in your blood. ? Making changes to your diet. You may need to see a food expert (dietitian) about what to eat and drink to prevent gout. Follow these instructions at home: During a gout attack   If told, put ice on the painful area: ? Put ice in a plastic bag. ? Place a towel between your skin and the bag. ? Leave the ice on for 20 minutes, 2-3 times a day.  Raise (elevate) the painful joint above the level of your heart as often as you can.  Rest the joint as much as possible. If the joint is in your leg, you may be given crutches.  Follow instructions from your doctor about what you cannot eat or drink. Avoiding future gout attacks  Eat a low-purine diet. Avoid foods and drinks such as: ? Liver. ? Kidney. ? Anchovies. ? Asparagus. ? Herring. ? Mushrooms. ? Mussels. ? Beer.  Stay at a healthy weight. If you want to lose weight, talk with your doctor. Do not lose weight   too fast.  Start or continue an exercise plan as told by your doctor. Eating and drinking  Drink enough fluids to keep your pee (urine) pale yellow.  If you drink alcohol: ? Limit how much you use to:  0-1 drink a day for women.  0-2 drinks a day for men. ? Be aware of how much alcohol is in your drink. In the [place] one drink equals one 12 oz bottle of beer (355 mL), one 5 oz glass of wine (148 mL), or one 1 oz glass of hard liquor (44 mL). General instructions  Take over-the-counter and prescription medicines only as told by your doctor.  Do  not drive or use heavy machinery while taking prescription pain medicine.  Return to your normal activities as told by your doctor. Ask your doctor what activities are safe for you.  Keep all follow-up visits as told by your doctor. This is important. Contact a doctor if:  You have another gout attack.  You still have symptoms of a gout attack after 10 days of treatment.  You have problems (side effects) because of your medicines.  You have chills or a fever.  You have burning pain when you pee (urinate).  You have pain in your lower back or belly. Get help right away if:  You have very bad pain.  Your pain cannot be controlled.  You cannot pee. Summary  Gout is painful swelling of the joints.  The most common site of pain is the big toe, but it can affect other joints.  Medicines and avoiding some foods can help to prevent and treat gout attacks. This information is not intended to replace advice given to you by your health care provider. Make sure you discuss any questions you have with your health care provider. Document Released: 04/01/2008 Document Revised: 01/13/2018 Document Reviewed: 01/13/2018 Elsevier Patient Education  2020 [employer].  

## 2019-03-08 NOTE — Addendum Note (Signed)
Addended by: Baruch Gouty on: 03/08/2019 09:00 PM   Modules accepted: Level of Service

## 2019-03-08 NOTE — Progress Notes (Signed)
Subjective:  Patient ID: Chase Christensen, male    DOB: [birth year], 48 y.o.   MRN: [id number]  Patient Care Team: Caryl Never as PCP - General (Physician Assistant)   Chief Complaint:  right elbow pain (red, hot, swollen ) and Cerumen Impaction   HPI: Chase Christensen is a 48 y.o. male presenting on 03/08/2019 for right elbow pain (red, hot, swollen ) and Cerumen Impaction   Pt presents today with complaints of right elbow pain. Pt states the pain started on Sunday and has became worse. Pt denies injury. He reports pain, redness, and swelling of the right elbow. He states the pain is aching, Segler, and throbbing, 8/10. He takes preventative gout medications on a regular basis. He states this is not helpful. He has not tried anything else for the pain. States he is out of his acute gout medications. Pt states he has a history of gout. Pt states his diet over the last several days has not been great. He has had some alcohol and a lot of processed meats. He denies fever, decreased ROM, weakness, numbness, tingling, or loss of function.  Pt also reports bilateral cerumen buildup. Pt states he feels like he has cerumen build up as his ears are itching and his hearing is slightly decreased. He denies pain. No tinnitus or pressure.   Arm Pain  The incident occurred 2 days ago. There was no injury mechanism. The pain is present in the right elbow. The quality of the pain is described as aching. The pain does not radiate. The pain is at a severity of 8/10. The pain is moderate. The pain has been fluctuating since the incident. Pertinent negatives include no chest pain, muscle weakness, numbness or tingling. The symptoms are aggravated by palpation. He has tried NSAIDs and rest (allopurinol) for the symptoms. The treatment provided no relief.     Relevant past medical, surgical, family, and social history reviewed and updated as indicated.  Allergies and medications reviewed and updated. Date  reviewed: Chart in Epic.   Past Medical History:  Diagnosis Date  . COPD (chronic obstructive pulmonary disease) (HCC)   . ED (erectile dysfunction)   . Gout   . Hypertension   . Pneumonia   . Sleep apnea with use of continuous positive airway pressure (CPAP)     Past Surgical History:  Procedure Laterality Date  . KNEE ARTHROSCOPY Right 03/15/2015  . WISDOM TOOTH EXTRACTION      Social History   Socioeconomic History  . Marital status: Legally Separated    Spouse name: Not on file  . Number of children: 1  . Years of education: Not on file  . Highest education level: Not on file  Occupational History  . Occupation: servic tech/ [employer]  Social Needs  . Financial resource strain: Not on file  . Food insecurity    Worry: Not on file    Inability: Not on file  . Transportation needs    Medical: Not on file    Non-medical: Not on file  Tobacco Use  . Smoking status: Current Every Day Smoker    Packs/day: 0.25    Types: Cigarettes  . Smokeless tobacco: Former Neurosurgeon    Types: Chew  Substance and Sexual Activity  . Alcohol use: Yes    Comment: occas  . Drug use: No  . Sexual activity: Not on file  Lifestyle  . Physical activity    Days per week: Not on file  Minutes per session: Not on file  . Stress: Not on file  Relationships  . Social Musician on phone: Not on file    Gets together: Not on file    Attends religious service: Not on file    Active member of club or organization: Not on file    Attends meetings of clubs or organizations: Not on file    Relationship status: Not on file  . Intimate partner violence    Fear of current or ex partner: Not on file    Emotionally abused: Not on file    Physically abused: Not on file    Forced sexual activity: Not on file  Other Topics Concern  . Not on file  Social History Narrative  . Not on file    Outpatient Encounter Medications as of 03/08/2019  Medication Sig  . albuterol (PROVENTIL  HFA;VENTOLIN HFA) 108 (90 Base) MCG/ACT inhaler TAKE 2 PUFFS BY MOUTH EVERY 6 HOURS AS NEEDED FOR WHEEZE OR SHORTNESS OF BREATH  . allopurinol (ZYLOPRIM) 100 MG tablet Take 1 tablet (100 mg total) by mouth daily.  Marland Kitchen amLODipine (NORVASC) 5 MG tablet Take 1 tablet (5 mg total) by mouth daily.  Marland Kitchen ibuprofen (ADVIL,MOTRIN) 800 MG tablet Take 1 tablet (800 mg total) by mouth every 8 (eight) hours as needed.  Marland Kitchen lisinopril (PRINIVIL,ZESTRIL) 40 MG tablet Take 1 tablet (40 mg total) by mouth daily.  . pantoprazole (PROTONIX) 40 MG tablet Take 1 tablet (40 mg total) by mouth daily before breakfast.  . colchicine 0.6 MG tablet TAKE 2 TABLETS AT FIRST SIGN OF FLARE, REPEAT 1 HOUR LATER, THEN TAKE 1 TAB DAILY FOR 4 TO 5 DAYS  . sildenafil (REVATIO) 20 MG tablet TAKE 1-5 TABLETS DAILY AS NEEDED (Patient not taking: Reported on 03/08/2019)  . traMADol (ULTRAM) 50 MG tablet Take 1 tablet (50 mg total) by mouth every 8 (eight) hours as needed for up to 5 days.  . [DISCONTINUED] colchicine 0.6 MG tablet TAKE 2 TABLETS AT FIRST SIGN OF FLARE, REPEAT 1 HOUR LATER, THEN TAKE 1 TAB DAILY FOR 4 TO 5 DAYS (Patient not taking: Reported on 03/08/2019)  . [DISCONTINUED] doxycycline (VIBRA-TABS) 100 MG tablet Take 1 tablet (100 mg total) by mouth 2 (two) times daily. 1 po bid  . [DISCONTINUED] ondansetron (ZOFRAN ODT) 4 MG disintegrating tablet Take 1 tablet (4 mg total) by mouth every 8 (eight) hours as needed for nausea or vomiting.   No facility-administered encounter medications on file as of 03/08/2019.     Allergies  Allergen Reactions  . Penicillins Rash    Review of Systems  Constitutional: Negative for activity change, appetite change, chills, diaphoresis, fatigue, fever and unexpected weight change.  HENT: Positive for hearing loss. Negative for ear discharge and ear pain.   Eyes: Negative for photophobia and visual disturbance.  Respiratory: Negative for cough and shortness of breath.   Cardiovascular: Negative  for chest pain, palpitations and leg swelling.  Genitourinary: Negative for decreased urine volume and difficulty urinating.  Musculoskeletal: Positive for arthralgias and joint swelling. Negative for back pain, gait problem, myalgias, neck pain and neck stiffness.  Skin: Positive for color change. Negative for pallor, rash and wound.  Neurological: Negative for dizziness, tingling, tremors, seizures, syncope, facial asymmetry, speech difficulty, weakness, light-headedness, numbness and headaches.  Psychiatric/Behavioral: Negative for confusion.  All other systems reviewed and are negative.       Objective:  BP 123/87   Pulse 72   Temp  99.8 F (37.7 C)   Ht 6\' 3"  (1.905 m)   Wt 265 lb (120.2 kg)   BMI 33.12 kg/m    Wt Readings from Last 3 Encounters:  03/08/19 265 lb (120.2 kg)  03/31/18 257 lb (116.6 kg)  03/07/18 260 lb (117.9 kg)    Physical Exam Vitals signs and nursing note reviewed.  Constitutional:      General: He is not in acute distress.    Appearance: Normal appearance. He is well-developed and well-groomed. He is obese. He is not ill-appearing, toxic-appearing or diaphoretic.  HENT:     Head: Normocephalic and atraumatic.     Jaw: There is normal jaw occlusion.     Right Ear: There is impacted cerumen.     Left Ear: There is impacted cerumen.     Nose: Nose normal.     Mouth/Throat:     Lips: Pink.     Mouth: Mucous membranes are moist.     Pharynx: Oropharynx is clear. Uvula midline.  Eyes:     General: Lids are normal.     Extraocular Movements: Extraocular movements intact.     Conjunctiva/sclera: Conjunctivae normal.     Pupils: Pupils are equal, round, and reactive to light.  Neck:     Musculoskeletal: Normal range of motion and neck supple.     Thyroid: No thyroid mass, thyromegaly or thyroid tenderness.     Vascular: No carotid bruit or JVD.     Trachea: Trachea and phonation normal.  Cardiovascular:     Rate and Rhythm: Normal rate and regular  rhythm.     Chest Wall: PMI is not displaced.     Pulses: Normal pulses.     Heart sounds: Normal heart sounds. No murmur. No friction rub. No gallop.   Pulmonary:     Effort: Pulmonary effort is normal. No respiratory distress.     Breath sounds: Normal breath sounds. No wheezing.  Abdominal:     General: Bowel sounds are normal. There is no distension or abdominal bruit.     Palpations: Abdomen is soft. There is no hepatomegaly or splenomegaly.     Tenderness: There is no abdominal tenderness. There is no right CVA tenderness or left CVA tenderness.     Hernia: No hernia is present.  Musculoskeletal:     Right shoulder: Normal.     Right elbow: He exhibits decreased range of motion (flexion and extension due to pain) and swelling (redness). He exhibits no effusion, no deformity and no laceration. Tenderness found. No radial head, no medial epicondyle, no lateral epicondyle and no olecranon process tenderness noted.     Right wrist: Normal.     Right lower leg: No edema.     Left lower leg: No edema.  Lymphadenopathy:     Cervical: No cervical adenopathy.  Skin:    General: Skin is warm and dry.     Capillary Refill: Capillary refill takes less than 2 seconds.     Coloration: Skin is not cyanotic, jaundiced or pale.     Findings: Erythema (rigth elbow) present. No rash.  Neurological:     General: No focal deficit present.     Mental Status: He is alert and oriented to person, place, and time.     Cranial Nerves: Cranial nerves are intact.     Sensory: Sensation is intact.     Motor: Motor function is intact.     Coordination: Coordination is intact.     Gait: Gait is intact.  Deep Tendon Reflexes: Reflexes are normal and symmetric.  Psychiatric:        Attention and Perception: Attention and perception normal.        Mood and Affect: Mood and affect normal.        Speech: Speech normal.        Behavior: Behavior normal. Behavior is cooperative.        Thought Content:  Thought content normal.        Cognition and Memory: Cognition and memory normal.        Judgment: Judgment normal.     Results for orders placed or performed during the hospital encounter of 03/07/18  CBC with Differential  Result Value Ref Range   WBC 11.2 (H) 4.0 - 10.5 K/uL   RBC 4.94 4.22 - 5.81 MIL/uL   Hemoglobin 16.2 13.0 - 17.0 g/dL   HCT 95.6 21.3 - 08.6 %   MCV 96.0 78.0 - 100.0 fL   MCH 32.8 26.0 - 34.0 pg   MCHC 34.2 30.0 - 36.0 g/dL   RDW 57.8 46.9 - 62.9 %   Platelets 188 150 - 400 K/uL   Neutrophils Relative % 66 %   Neutro Abs 7.3 1.7 - 7.7 K/uL   Lymphocytes Relative 20 %   Lymphs Abs 2.3 0.7 - 4.0 K/uL   Monocytes Relative 9 %   Monocytes Absolute 1.1 (H) 0.1 - 1.0 K/uL   Eosinophils Relative 5 %   Eosinophils Absolute 0.6 0.0 - 0.7 K/uL   Basophils Relative 0 %   Basophils Absolute 0.0 0.0 - 0.1 K/uL  Basic metabolic panel  Result Value Ref Range   Sodium 142 135 - 145 mmol/L   Potassium 4.0 3.5 - 5.1 mmol/L   Chloride 111 98 - 111 mmol/L   CO2 24 22 - 32 mmol/L   Glucose, Bld 115 (H) 70 - 99 mg/dL   BUN 19 6 - 20 mg/dL   Creatinine, Ser 5.28 0.61 - 1.24 mg/dL   Calcium 8.9 8.9 - 41.3 mg/dL   GFR calc non Af Amer >60 >60 mL/min   GFR calc Af Amer >60 >60 mL/min   Anion gap 7 5 - 15     Ear Cerumen Removal  Date/Time: 03/08/2019 9:23 AM Performed by: Sonny Masters, FNP Authorized by: Sonny Masters, FNP   Anesthesia: Local Anesthetic: none Location details: left ear and right ear Patient tolerance: patient tolerated the procedure well with no immediate complications Comments: Pt name, DOB, and procedure verified before completion.  Procedure type: irrigation  Sedation: Patient sedated: no     Bilateral TM pearly gray without erythema, bulging, or perforation after cerumen removal. Hearing normal post cerumen removal.   Pertinent labs & imaging results that were available during my care of the patient were reviewed by me and considered in  my medical decision making.  Assessment & Plan:  Tijuan was seen today for right elbow pain.  Diagnoses and all orders for this visit:  Acute idiopathic gout of right elbow Elevated blood uric acid level Redness and swelling of elbow Signs and symptoms consistent with acute gout of right elbow. No red flags concerning for septic arthritis. Will check uric acid level and CBC today. Will treat acute flare with colchicine. Proper dosing discussed. Will give tramadol to help with acute pain. Symptomatic care discussed. Medications as prescribed. Report any new or worsening symptoms. Follow up if symptoms do not improve.  -     colchicine 0.6 MG tablet;  TAKE 2 TABLETS AT FIRST SIGN OF FLARE, REPEAT 1 HOUR LATER, THEN TAKE 1 TAB DAILY FOR 4 TO 5 DAYS -     traMADol (ULTRAM) 50 MG tablet; Take 1 tablet (50 mg total) by mouth every 8 (eight) hours as needed for up to 5 days. -     CBC with Differential/Platelet -     Uric Acid  Bilateral impacted cerumen Cerumen removed in office via lavage. Pt tolerated will. Avoid using Q-Tips in ears. Can use debrox several times per week to prevent future cerumen impaction.  -     Ear Lavage -     Ear wax removal     Continue all other maintenance medications.  Follow up plan: Return if symptoms worsen or fail to improve.  Continue healthy lifestyle choices, including diet (rich in fruits, vegetables, and lean proteins, and low in salt and simple carbohydrates) and exercise (at least 30 minutes of moderate physical activity daily).  Educational handout given for gout  The above assessment and management plan was discussed with the patient. The patient verbalized understanding of and has agreed to the management plan. Patient is aware to call the clinic if symptoms persist or worsen. Patient is aware when to return to the clinic for a follow-up visit. Patient educated on when it is appropriate to go to the emergency department.   Kari Baars, FNP-C  [facility] [phone]

## 2019-03-09 ENCOUNTER — Encounter: Payer: Self-pay | Admitting: Family Medicine

## 2019-03-09 ENCOUNTER — Other Ambulatory Visit: Payer: Self-pay | Admitting: Physician Assistant

## 2019-03-09 DIAGNOSIS — M10021 Idiopathic gout, right elbow: Secondary | ICD-10-CM

## 2019-03-09 DIAGNOSIS — E790 Hyperuricemia without signs of inflammatory arthritis and tophaceous disease: Secondary | ICD-10-CM

## 2019-03-09 LAB — CBC WITH DIFFERENTIAL/PLATELET
Basophils Absolute: 0 10*3/uL (ref 0.0–0.2)
Basos: 0 %
EOS (ABSOLUTE): 0.4 10*3/uL (ref 0.0–0.4)
Eos: 4 %
Hematocrit: 45.3 % (ref 37.5–51.0)
Hemoglobin: 15.9 g/dL (ref 13.0–17.7)
Immature Grans (Abs): 0.1 10*3/uL (ref 0.0–0.1)
Immature Granulocytes: 1 %
Lymphocytes Absolute: 2 10*3/uL (ref 0.7–3.1)
Lymphs: 20 %
MCH: 32.7 pg (ref 26.6–33.0)
MCHC: 35.1 g/dL (ref 31.5–35.7)
MCV: 93 fL (ref 79–97)
Monocytes Absolute: 0.9 10*3/uL (ref 0.1–0.9)
Monocytes: 8 %
Neutrophils Absolute: 7 10*3/uL (ref 1.4–7.0)
Neutrophils: 67 %
Platelets: 213 10*3/uL (ref 150–450)
RBC: 4.86 x10E6/uL (ref 4.14–5.80)
RDW: 12.4 % (ref 11.6–15.4)
WBC: 10.4 10*3/uL (ref 3.4–10.8)

## 2019-03-09 LAB — URIC ACID: Uric Acid: 5.7 mg/dL (ref 3.7–8.6)

## 2019-03-09 MED ORDER — COLCHICINE 0.6 MG PO TABS: ORAL_TABLET | ORAL | 0 refills | Status: DC

## 2019-03-09 NOTE — Telephone Encounter (Signed)
Pharmacy comment:  REQUEST FOR 90 DAYS PRESCRIPTION. DX Code Needed    

## 2019-04-01 ENCOUNTER — Other Ambulatory Visit: Payer: Self-pay | Admitting: Physician Assistant

## 2019-06-13 DIAGNOSIS — S92334D Nondisplaced fracture of third metatarsal bone, right foot, subsequent encounter for fracture with routine healing: Secondary | ICD-10-CM | POA: Diagnosis not present

## 2019-06-13 DIAGNOSIS — S9031XD Contusion of right foot, subsequent encounter: Secondary | ICD-10-CM | POA: Diagnosis not present

## 2019-06-21 DIAGNOSIS — S92334D Nondisplaced fracture of third metatarsal bone, right foot, subsequent encounter for fracture with routine healing: Secondary | ICD-10-CM | POA: Diagnosis not present

## 2019-06-21 DIAGNOSIS — S9031XD Contusion of right foot, subsequent encounter: Secondary | ICD-10-CM | POA: Diagnosis not present

## 2019-07-11 ENCOUNTER — Telehealth: Payer: Self-pay | Admitting: Physician Assistant

## 2019-07-29 DIAGNOSIS — Z23 Encounter for immunization: Secondary | ICD-10-CM | POA: Diagnosis not present

## 2019-08-29 DIAGNOSIS — Z23 Encounter for immunization: Secondary | ICD-10-CM | POA: Diagnosis not present

## 2019-09-26 ENCOUNTER — Encounter: Payer: BC Managed Care – PPO | Admitting: Family Medicine

## 2019-09-26 ENCOUNTER — Encounter: Payer: Self-pay | Admitting: Family Medicine

## 2019-09-26 NOTE — Progress Notes (Signed)
No answer with two attempts at video over 20 minutes.  WS

## 2019-10-06 ENCOUNTER — Other Ambulatory Visit: Payer: Self-pay | Admitting: Family Medicine

## 2019-10-06 DIAGNOSIS — K219 Gastro-esophageal reflux disease without esophagitis: Secondary | ICD-10-CM

## 2019-10-06 DIAGNOSIS — E790 Hyperuricemia without signs of inflammatory arthritis and tophaceous disease: Secondary | ICD-10-CM

## 2019-10-06 DIAGNOSIS — M109 Gout, unspecified: Secondary | ICD-10-CM

## 2019-10-06 MED ORDER — PANTOPRAZOLE SODIUM 40 MG PO TBEC: 40.0000 mg | DELAYED_RELEASE_TABLET | Freq: Every day | ORAL | 0 refills | Status: DC

## 2019-10-06 NOTE — Telephone Encounter (Signed)
Patient has a follow up appointment scheduled with B. Alona Bene to establish care for refills.

## 2019-10-13 ENCOUNTER — Other Ambulatory Visit: Payer: Self-pay

## 2019-10-13 ENCOUNTER — Ambulatory Visit (INDEPENDENT_AMBULATORY_CARE_PROVIDER_SITE_OTHER): Payer: BC Managed Care – PPO | Admitting: Family Medicine

## 2019-10-13 ENCOUNTER — Encounter: Payer: Self-pay | Admitting: Family Medicine

## 2019-10-13 VITALS — BP 132/91 | HR 80 | Temp 98.9°F | Ht 75.0 in | Wt 258.0 lb

## 2019-10-13 DIAGNOSIS — Z0001 Encounter for general adult medical examination with abnormal findings: Secondary | ICD-10-CM | POA: Diagnosis not present

## 2019-10-13 DIAGNOSIS — K219 Gastro-esophageal reflux disease without esophagitis: Secondary | ICD-10-CM

## 2019-10-13 DIAGNOSIS — Z125 Encounter for screening for malignant neoplasm of prostate: Secondary | ICD-10-CM

## 2019-10-13 DIAGNOSIS — Z0000 Encounter for general adult medical examination without abnormal findings: Secondary | ICD-10-CM | POA: Diagnosis not present

## 2019-10-13 DIAGNOSIS — N529 Male erectile dysfunction, unspecified: Secondary | ICD-10-CM

## 2019-10-13 DIAGNOSIS — I10 Essential (primary) hypertension: Secondary | ICD-10-CM

## 2019-10-13 DIAGNOSIS — E790 Hyperuricemia without signs of inflammatory arthritis and tophaceous disease: Secondary | ICD-10-CM | POA: Diagnosis not present

## 2019-10-13 DIAGNOSIS — J9801 Acute bronchospasm: Secondary | ICD-10-CM

## 2019-10-13 MED ORDER — ALBUTEROL SULFATE HFA 108 (90 BASE) MCG/ACT IN AERS: 2.0000 | INHALATION_SPRAY | Freq: Four times a day (QID) | RESPIRATORY_TRACT | 2 refills | Status: DC | PRN

## 2019-10-13 MED ORDER — ALLOPURINOL 100 MG PO TABS: 200.0000 mg | ORAL_TABLET | Freq: Every day | ORAL | 1 refills | Status: DC

## 2019-10-13 MED ORDER — SILDENAFIL CITRATE 100 MG PO TABS: 100.0000 mg | ORAL_TABLET | Freq: Every day | ORAL | 2 refills | Status: DC | PRN

## 2019-10-13 MED ORDER — LISINOPRIL 40 MG PO TABS: 40.0000 mg | ORAL_TABLET | Freq: Every day | ORAL | 1 refills | Status: DC

## 2019-10-13 MED ORDER — AMLODIPINE BESYLATE 10 MG PO TABS: 10.0000 mg | ORAL_TABLET | Freq: Every day | ORAL | 1 refills | Status: DC

## 2019-10-13 MED ORDER — PANTOPRAZOLE SODIUM 40 MG PO TBEC: 40.0000 mg | DELAYED_RELEASE_TABLET | Freq: Every day | ORAL | 1 refills | Status: DC

## 2019-10-13 NOTE — Progress Notes (Signed)
Assessment & Plan:  1. Well adult exam - Preventive health education provided. Patient declined TDAP and HIV screening.  - CBC with Differential/Platelet - CMP14+EGFR - Lipid panel  2. Essential hypertension - Uncontrolled. Amlodipine increased from 5 mg to 10 mg PO QD. Encouraged to keep a log at home and bring with him to his next appointment.  - amLODipine (NORVASC) 10 MG tablet; Take 1 tablet (10 mg total) by mouth daily.  Dispense: 90 tablet; Refill: 1 - lisinopril (ZESTRIL) 40 MG tablet; Take 1 tablet (40 mg total) by mouth daily.  Dispense: 90 tablet; Refill: 1 - CBC with Differential/Platelet - CMP14+EGFR - Lipid panel  3. Elevated blood uric acid level - Allopurinol increased from 100 mg to 200 mg PO QD as he reports he keeps having occasionally flares.  - allopurinol (ZYLOPRIM) 100 MG tablet; Take 2 tablets (200 mg total) by mouth daily.  Dispense: 180 tablet; Refill: 1 - Uric acid  4. Gastroesophageal reflux disease without esophagitis - Well controlled on current regimen.  - pantoprazole (PROTONIX) 40 MG tablet; Take 1 tablet (40 mg total) by mouth daily before breakfast.  Dispense: 90 tablet; Refill: 1 - CBC with Differential/Platelet - CMP14+EGFR  5. Bronchospasm - Well controlled on current regimen.  - albuterol (VENTOLIN HFA) 108 (90 Base) MCG/ACT inhaler; Inhale 2 puffs into the lungs every 6 (six) hours as needed for wheezing or shortness of breath.  Dispense: 18 g; Refill: 2  6. Erectile dysfunction, unspecified erectile dysfunction type - Well controlled on current regimen.  - sildenafil (VIAGRA) 100 MG tablet; Take 1 tablet (100 mg total) by mouth daily as needed for erectile dysfunction.  Dispense: 30 tablet; Refill: 2 - CBC with Differential/Platelet  7. Prostate cancer screening - PSA, total and free   Return in about 6 weeks (around 11/24/2019) for HTN.  [name], MSN, APRN, FNP-C [facility]  Subjective:    Patient  ID: AADIN LATON, male    DOB: 11/27/1970, 49 y.o.   MRN: [id number]  Patient Care Team: Gwenlyn Fudge, FNP as PCP - General (Family Medicine)   Chief Complaint:  Chief Complaint  Patient presents with  . Establish Care    Warnell Bureau   . Medical Management of Chronic Issues    check up of chronic medical conditions    HPI: RENALDO WORTHMAN is a 49 y.o. male presenting on 10/13/2019 for Establish Care Yetta Barre atient ) and Medical Management of Chronic Issues (check up of chronic medical conditions)  Patient reports his BP readings at home are worse than the reading we got today.   New complaints: None  Social history:  Relevant past medical, surgical, family and social history reviewed and updated as indicated. Interim medical history since our last visit reviewed.  Allergies and medications reviewed and updated.  DATA REVIEWED: CHART IN EPIC  ROS: Negative unless specifically indicated above in HPI.    Current Outpatient Medications:  .  albuterol (VENTOLIN HFA) 108 (90 Base) MCG/ACT inhaler, Inhale 2 puffs into the lungs every 6 (six) hours as needed for wheezing or shortness of breath., Disp: 18 g, Rfl: 2 .  allopurinol (ZYLOPRIM) 100 MG tablet, Take 2 tablets (200 mg total) by mouth daily., Disp: 180 tablet, Rfl: 1 .  amLODipine (NORVASC) 10 MG tablet, Take 1 tablet (10 mg total) by mouth daily., Disp: 90 tablet, Rfl: 1 .  ibuprofen (ADVIL,MOTRIN) 800 MG tablet, Take 1 tablet (800 mg total) by mouth every 8 (  eight) hours as needed., Disp: 21 tablet, Rfl: 0 .  lisinopril (ZESTRIL) 40 MG tablet, Take 1 tablet (40 mg total) by mouth daily., Disp: 90 tablet, Rfl: 1 .  pantoprazole (PROTONIX) 40 MG tablet, Take 1 tablet (40 mg total) by mouth daily before breakfast., Disp: 90 tablet, Rfl: 1 .  sildenafil (REVATIO) 20 MG tablet, TAKE 1-5 TABLETS DAILY AS NEEDED, Disp: 50 tablet, Rfl: 3 .  sildenafil (VIAGRA) 100 MG tablet, Take 1 tablet (100 mg total) by mouth daily as needed  for erectile dysfunction., Disp: 30 tablet, Rfl: 2   Allergies  Allergen Reactions  . Penicillins Rash   Past Medical History:  Diagnosis Date  . COPD (chronic obstructive pulmonary disease) (HCC)   . ED (erectile dysfunction)   . Gout   . Hypertension   . Pneumonia     Past Surgical History:  Procedure Laterality Date  . KNEE ARTHROSCOPY Right 03/15/2015  . WISDOM TOOTH EXTRACTION      Social History   Socioeconomic History  . Marital status: Legally Separated    Spouse name: Not on file  . Number of children: 1  . Years of education: Not on file  . Highest education level: Not on file  Occupational History  . Occupation: servic tech/ [employer]  Tobacco Use  . Smoking status: Current Every Day Smoker    Packs/day: 0.25    Types: Cigarettes  . Smokeless tobacco: Former Neurosurgeon    Types: Chew  Substance and Sexual Activity  . Alcohol use: Yes    Comment: occas  . Drug use: No  . Sexual activity: Not on file  Other Topics Concern  . Not on file  Social History Narrative  . Not on file   Social Determinants of Health   Financial Resource Strain:   . Difficulty of Paying Living Expenses:   Food Insecurity:   . Worried About Programme researcher, broadcasting/film/video in the Last Year:   . Barista in the Last Year:   Transportation Needs:   . Freight forwarder (Medical):   Marland Kitchen Lack of Transportation (Non-Medical):   Physical Activity:   . Days of Exercise per Week:   . Minutes of Exercise per Session:   Stress:   . Feeling of Stress :   Social Connections:   . Frequency of Communication with Friends and Family:   . Frequency of Social Gatherings with Friends and Family:   . Attends Religious Services:   . Active Member of Clubs or Organizations:   . Attends Banker Meetings:   Marland Kitchen Marital Status:   Intimate Partner Violence:   . Fear of Current or Ex-Partner:   . Emotionally Abused:   Marland Kitchen Physically Abused:   . Sexually Abused:         Objective:      BP (!) 132/91   Pulse 80   Temp 98.9 F (37.2 C) (Temporal)   Ht 6\' 3"  (1.905 m)   Wt 258 lb (117 kg)   SpO2 95%   BMI 32.25 kg/m   Wt Readings from Last 3 Encounters:  10/13/19 258 lb (117 kg)  03/08/19 265 lb (120.2 kg)  03/31/18 257 lb (116.6 kg)    Physical Exam Vitals reviewed.  Constitutional:      General: He is not in acute distress.    Appearance: Normal appearance. He is obese. He is not ill-appearing, toxic-appearing or diaphoretic.  HENT:     Head: Normocephalic and atraumatic.  Right Ear: Tympanic membrane, ear canal and external ear normal. There is no impacted cerumen.     Left Ear: Tympanic membrane, ear canal and external ear normal. There is no impacted cerumen.     Nose: Nose normal. No congestion or rhinorrhea.     Mouth/Throat:     Mouth: Mucous membranes are moist.     Pharynx: Oropharynx is clear. No oropharyngeal exudate or posterior oropharyngeal erythema.  Eyes:     General: No scleral icterus.       Right eye: No discharge.        Left eye: No discharge.     Conjunctiva/sclera: Conjunctivae normal.     Pupils: Pupils are equal, round, and reactive to light.  Cardiovascular:     Rate and Rhythm: Normal rate and regular rhythm.     Heart sounds: Normal heart sounds. No murmur. No friction rub. No gallop.   Pulmonary:     Effort: Pulmonary effort is normal. No respiratory distress.     Breath sounds: Normal breath sounds. No stridor. No wheezing, rhonchi or rales.  Abdominal:     General: Abdomen is flat. Bowel sounds are normal. There is no distension.     Palpations: Abdomen is soft. There is no mass.     Tenderness: There is no abdominal tenderness. There is no guarding or rebound.     Hernia: No hernia is present.  Musculoskeletal:        General: Normal range of motion.     Cervical back: Normal range of motion and neck supple. No rigidity. No muscular tenderness.     Right lower leg: No edema.     Left lower leg: No edema.   Lymphadenopathy:     Cervical: No cervical adenopathy.  Skin:    General: Skin is warm and dry.     Capillary Refill: Capillary refill takes less than 2 seconds.  Neurological:     General: No focal deficit present.     Mental Status: He is alert and oriented to person, place, and time. Mental status is at baseline.  Psychiatric:        Mood and Affect: Mood normal.        Behavior: Behavior normal.        Thought Content: Thought content normal.        Judgment: Judgment normal.     Lab Results  Component Value Date   TSH 2.280 07/21/2017   Lab Results  Component Value Date   WBC 14.9 (H) 10/13/2019   HGB 17.6 10/13/2019   HCT 51.2 (H) 10/13/2019   MCV 95 10/13/2019   PLT 228 10/13/2019   Lab Results  Component Value Date   NA 140 10/13/2019   K 4.3 10/13/2019   CO2 21 10/13/2019   GLUCOSE 105 (H) 10/13/2019   BUN 15 10/13/2019   CREATININE 1.06 10/13/2019   BILITOT 0.6 10/13/2019   ALKPHOS 103 10/13/2019   AST 29 10/13/2019   ALT 29 10/13/2019   PROT 6.9 10/13/2019   ALBUMIN 4.5 10/13/2019   CALCIUM 9.8 10/13/2019   ANIONGAP 7 03/07/2018   Lab Results  Component Value Date   CHOL 136 10/13/2019   Lab Results  Component Value Date   HDL 32 (L) 10/13/2019   Lab Results  Component Value Date   LDLCALC 77 10/13/2019   Lab Results  Component Value Date   TRIG 155 (H) 10/13/2019   Lab Results  Component Value Date   CHOLHDL 4.3 10/13/2019  Lab Results  Component Value Date   HGBA1C 5.9 01/27/2018

## 2019-10-13 NOTE — Patient Instructions (Signed)
Preventive Care 44-48 Years Old, Male Preventive care refers to lifestyle choices and visits with your health care provider that can promote health and wellness. This includes:  A yearly physical exam. This is also called an annual well check.  Regular dental and eye exams.  Immunizations.  Screening for certain conditions.  Healthy lifestyle choices, such as eating a healthy diet, getting regular exercise, not using drugs or products that contain nicotine and tobacco, and limiting alcohol use. What can I expect for my preventive care visit? Physical exam Your health care provider will check:  Height and weight. These may be used to calculate body mass index (BMI), which is a measurement that tells if you are at a healthy weight.  Heart rate and blood pressure.  Your skin for abnormal spots. Counseling Your health care provider may ask you questions about:  Alcohol, tobacco, and drug use.  Emotional well-being.  Home and relationship well-being.  Sexual activity.  Eating habits.  Work and work Statistician. What immunizations do I need?  Influenza (flu) vaccine  This is recommended every year. Tetanus, diphtheria, and pertussis (Tdap) vaccine  You may need a Td booster every 10 years. Varicella (chickenpox) vaccine  You may need this vaccine if you have not already been vaccinated. Zoster (shingles) vaccine  You may need this after age 69. Measles, mumps, and rubella (MMR) vaccine  You may need at least one dose of MMR if you were born in 1957 or later. You may also need a second dose. Pneumococcal conjugate (PCV13) vaccine  You may need this if you have certain conditions and were not previously vaccinated. Pneumococcal polysaccharide (PPSV23) vaccine  You may need one or two doses if you smoke cigarettes or if you have certain conditions. Meningococcal conjugate (MenACWY) vaccine  You may need this if you have certain conditions. Hepatitis A  vaccine  You may need this if you have certain conditions or if you travel or work in places where you may be exposed to hepatitis A. Hepatitis B vaccine  You may need this if you have certain conditions or if you travel or work in places where you may be exposed to hepatitis B. Haemophilus influenzae type b (Hib) vaccine  You may need this if you have certain risk factors. Human papillomavirus (HPV) vaccine  If recommended by your health care provider, you may need three doses over 6 months. You may receive vaccines as individual doses or as more than one vaccine together in one shot (combination vaccines). Talk with your health care provider about the risks and benefits of combination vaccines. What tests do I need? Blood tests  Lipid and cholesterol levels. These may be checked every 5 years, or more frequently if you are over 48 years old.  Hepatitis C test.  Hepatitis B test. Screening  Lung cancer screening. You may have this screening every year starting at age 48 if you have a 30-pack-year history of smoking and currently smoke or have quit within the past 15 years.  Prostate cancer screening. Recommendations will vary depending on your family history and other risks.  Colorectal cancer screening. All adults should have this screening starting at age 48 and continuing until age 48. Your health care provider may recommend screening at age 48 if you are at increased risk. You will have tests every 1-10 years, depending on your results and the type of screening test.  Diabetes screening. This is done by checking your blood sugar (glucose) after you have not  eaten for a while (fasting). You may have this done every 1-3 years.  Sexually transmitted disease (STD) testing. Follow these instructions at home: Eating and drinking  Eat a diet that includes fresh fruits and vegetables, whole grains, lean protein, and low-fat dairy products.  Take vitamin and mineral supplements as  recommended by your health care provider.  Do not drink alcohol if your health care provider tells you not to drink.  If you drink alcohol: ? Limit how much you have to 0-2 drinks a day. ? Be aware of how much alcohol is in your drink. In the [place] one drink equals one 12 oz bottle of beer (355 mL), one 5 oz glass of wine (148 mL), or one 1 oz glass of hard liquor (44 mL). Lifestyle  Take daily care of your teeth and gums.  Stay active. Exercise for at least 30 minutes on 5 or more days each week.  Do not use any products that contain nicotine or tobacco, such as cigarettes, e-cigarettes, and chewing tobacco. If you need help quitting, ask your health care provider.  If you are sexually active, practice safe sex. Use a condom or other form of protection to prevent STIs (sexually transmitted infections).  Talk with your health care provider about taking a low-dose aspirin every day starting at age 48. What's next?  Go to your health care provider once a year for a well check visit.  Ask your health care provider how often you should have your eyes and teeth checked.  Stay up to date on all vaccines. This information is not intended to replace advice given to you by your health care provider. Make sure you discuss any questions you have with your health care provider. Document Revised: 06/17/2018 Document Reviewed: 06/17/2018 Elsevier Patient Education  2020 [employer]vier Inc.   

## 2019-10-14 LAB — CBC WITH DIFFERENTIAL/PLATELET
Basophils Absolute: 0.1 10*3/uL (ref 0.0–0.2)
Basos: 1 %
EOS (ABSOLUTE): 0.5 10*3/uL — ABNORMAL HIGH (ref 0.0–0.4)
Eos: 3 %
Hematocrit: 51.2 % — ABNORMAL HIGH (ref 37.5–51.0)
Hemoglobin: 17.6 g/dL (ref 13.0–17.7)
Immature Grans (Abs): 0 10*3/uL (ref 0.0–0.1)
Immature Granulocytes: 0 %
Lymphocytes Absolute: 2 10*3/uL (ref 0.7–3.1)
Lymphs: 13 %
MCH: 32.8 pg (ref 26.6–33.0)
MCHC: 34.4 g/dL (ref 31.5–35.7)
MCV: 95 fL (ref 79–97)
Monocytes Absolute: 1.1 10*3/uL — ABNORMAL HIGH (ref 0.1–0.9)
Monocytes: 7 %
Neutrophils Absolute: 11.2 10*3/uL — ABNORMAL HIGH (ref 1.4–7.0)
Neutrophils: 76 %
Platelets: 228 10*3/uL (ref 150–450)
RBC: 5.37 x10E6/uL (ref 4.14–5.80)
RDW: 12.9 % (ref 11.6–15.4)
WBC: 14.9 10*3/uL — ABNORMAL HIGH (ref 3.4–10.8)

## 2019-10-14 LAB — URIC ACID: Uric Acid: 6.8 mg/dL (ref 3.8–8.4)

## 2019-10-14 LAB — LIPID PANEL
Chol/HDL Ratio: 4.3 ratio (ref 0.0–5.0)
Cholesterol, Total: 136 mg/dL (ref 100–199)
HDL: 32 mg/dL — ABNORMAL LOW (ref >39)
LDL Chol Calc (NIH): 77 mg/dL (ref 0–99)
Triglycerides: 155 mg/dL — ABNORMAL HIGH (ref 0–149)
VLDL Cholesterol Cal: 27 mg/dL (ref 5–40)

## 2019-10-14 LAB — CMP14+EGFR
ALT: 29 IU/L (ref 0–44)
AST: 29 IU/L (ref 0–40)
Albumin/Globulin Ratio: 1.9 (ref 1.2–2.2)
Albumin: 4.5 g/dL (ref 4.0–5.0)
Alkaline Phosphatase: 103 IU/L (ref 39–117)
BUN/Creatinine Ratio: 14 (ref 9–20)
BUN: 15 mg/dL (ref 6–24)
Bilirubin Total: 0.6 mg/dL (ref 0.0–1.2)
CO2: 21 mmol/L (ref 20–29)
Calcium: 9.8 mg/dL (ref 8.7–10.2)
Chloride: 105 mmol/L (ref 96–106)
Creatinine, Ser: 1.06 mg/dL (ref 0.76–1.27)
GFR calc Af Amer: 96 mL/min/{1.73_m2} (ref >59)
GFR calc non Af Amer: 83 mL/min/{1.73_m2} (ref >59)
Globulin, Total: 2.4 g/dL (ref 1.5–4.5)
Glucose: 105 mg/dL — ABNORMAL HIGH (ref 65–99)
Potassium: 4.3 mmol/L (ref 3.5–5.2)
Sodium: 140 mmol/L (ref 134–144)
Total Protein: 6.9 g/dL (ref 6.0–8.5)

## 2019-10-14 LAB — PSA, TOTAL AND FREE
PSA, Free Pct: 20.7 %
PSA, Free: 0.31 ng/mL
Prostate Specific Ag, Serum: 1.5 ng/mL (ref 0.0–4.0)

## 2019-10-17 ENCOUNTER — Encounter: Payer: Self-pay | Admitting: Family Medicine

## 2019-10-21 DIAGNOSIS — M25561 Pain in right knee: Secondary | ICD-10-CM | POA: Diagnosis not present

## 2019-11-14 ENCOUNTER — Ambulatory Visit (INDEPENDENT_AMBULATORY_CARE_PROVIDER_SITE_OTHER): Payer: BC Managed Care – PPO | Admitting: Family Medicine

## 2019-11-14 NOTE — Progress Notes (Signed)
Attempted to call patient and he did not answer, left a voicemail for him to call back tomorrow. Chase Care, MD [name] 11/14/2019, 6:18 PM

## 2019-11-17 ENCOUNTER — Telehealth: Payer: Self-pay | Admitting: *Deleted

## 2019-11-17 NOTE — Telephone Encounter (Signed)
Pt called and c/o pain to the right of sternum, states it hurts worse with cough. Does c/o extreme fatique that started today. Pt does not believe it is chest or heart related. Advised to go to ED if any other symptoms appear or worsen. Televisit appt made for 11/18/19

## 2019-11-18 ENCOUNTER — Encounter: Payer: Self-pay | Admitting: Family

## 2019-11-18 ENCOUNTER — Ambulatory Visit (INDEPENDENT_AMBULATORY_CARE_PROVIDER_SITE_OTHER): Payer: BC Managed Care – PPO | Admitting: Family

## 2019-11-18 DIAGNOSIS — R05 Cough: Secondary | ICD-10-CM | POA: Diagnosis not present

## 2019-11-18 DIAGNOSIS — R0781 Pleurodynia: Secondary | ICD-10-CM

## 2019-11-18 DIAGNOSIS — R059 Cough, unspecified: Secondary | ICD-10-CM

## 2019-11-18 DIAGNOSIS — F17200 Nicotine dependence, unspecified, uncomplicated: Secondary | ICD-10-CM | POA: Diagnosis not present

## 2019-11-18 MED ORDER — PREDNISONE 10 MG (21) PO TBPK: ORAL_TABLET | ORAL | 0 refills | Status: DC

## 2019-11-18 MED ORDER — AZITHROMYCIN 250 MG PO TABS: ORAL_TABLET | ORAL | 0 refills | Status: DC

## 2019-11-18 NOTE — Progress Notes (Signed)
Virtual Visit via telephone Note Due to COVID-19 pandemic this visit was conducted virtually. This visit type was conducted due to national recommendations for restrictions regarding the COVID-19 Pandemic (e.g. social distancing, sheltering in place) in an effort to limit this patient's exposure and mitigate transmission in our community. All issues noted in this document were discussed and addressed.  A physical exam was not performed with this format.  I connected with Chase Christensen on 11/18/19 at 2:00 pm  by telephone and verified that I am speaking with the correct person using two identifiers. Chase Christensen is currently located at car and no one is currently with him during visit. The provider, Evelina Dun, FNP is located in their office at time of visit.  I discussed the limitations, risks, security and privacy concerns of performing an evaluation and management service by telephone and the availability of in person appointments. I also discussed with the patient that there may be a patient responsible charge related to this service. The patient expressed understanding and agreed to proceed.   History and Present Illness:  Chest Pain  This is a new problem. The current episode started in the past 7 days. The onset quality is gradual. The problem occurs intermittently. The problem has been waxing and waning. The pain is present in the substernal region (right side when coughing). The pain is at a severity of 4/10. The pain is moderate. The quality of the pain is described as Stancil. Radiates to: back. Associated symptoms include back pain, a cough, a fever (Monday) and malaise/fatigue. Pertinent negatives include no diaphoresis, dizziness, headaches, orthopnea, palpitations, shortness of breath, sputum production, vomiting or weakness.  Cough This is a new problem. The cough is non-productive. Associated symptoms include chest pain and a fever (Monday). Pertinent negatives include no  headaches or shortness of breath.      Review of Systems  Constitutional: Positive for fever (Monday) and malaise/fatigue. Negative for diaphoresis.  Respiratory: Positive for cough. Negative for sputum production and shortness of breath.   Cardiovascular: Positive for chest pain. Negative for palpitations and orthopnea.  Gastrointestinal: Negative for vomiting.  Musculoskeletal: Positive for back pain.  Neurological: Negative for dizziness, weakness and headaches.  All other systems reviewed and are negative.    Observations/Objective: No SOB or distress noted   Assessment and Plan: 1. Cough - Take meds as prescribed - Use a cool mist humidifier  -Use saline nose sprays frequently -Force fluids -For any cough or congestion  Use plain Mucinex- regular strength or max strength is fine -For fever or aces or pains- take tylenol or ibuprofen. -Throat lozenges if help -New toothbrush in 3 days RTO if symptoms worsen or do not improve  - predniSONE (STERAPRED UNI-PAK 21 TAB) 10 MG (21) TBPK tablet; Use as directed  Dispense: 21 tablet; Refill: 0 - azithromycin (ZITHROMAX) 250 MG tablet; Take 500 mg once, then 250 mg for four days  Dispense: 6 tablet; Refill: 0  2. Rib pain on left side - predniSONE (STERAPRED UNI-PAK 21 TAB) 10 MG (21) TBPK tablet; Use as directed  Dispense: 21 tablet; Refill: 0 - azithromycin (ZITHROMAX) 250 MG tablet; Take 500 mg once, then 250 mg for four days  Dispense: 6 tablet; Refill: 0  3. Smoker Smoking cessation discussed     I discussed the assessment and treatment plan with the patient. The patient was provided an opportunity to ask questions and all were answered. The patient agreed with the plan and demonstrated an understanding  of the instructions.   The patient was advised to call back or seek an in-person evaluation if the symptoms worsen or if the condition fails to improve as anticipated.  The above assessment and management plan was  discussed with the patient. The patient verbalized understanding of and has agreed to the management plan. Patient is aware to call the clinic if symptoms persist or worsen. Patient is aware when to return to the clinic for a follow-up visit. Patient educated on when it is appropriate to go to the emergency department.   Time call ended:  2:09 pm  I provided 9 minutes of non-face-to-face time during this encounter.    Chase Rodney, FNP

## 2019-11-25 ENCOUNTER — Ambulatory Visit: Payer: BC Managed Care – PPO | Admitting: Family Medicine

## 2020-01-19 ENCOUNTER — Encounter: Payer: Self-pay | Admitting: Family Medicine

## 2020-01-19 ENCOUNTER — Ambulatory Visit: Payer: BC Managed Care – PPO | Admitting: Family Medicine

## 2020-01-19 ENCOUNTER — Other Ambulatory Visit: Payer: Self-pay

## 2020-01-19 VITALS — BP 122/89 | HR 81 | Temp 98.2°F | Ht 75.0 in | Wt 261.0 lb

## 2020-01-19 DIAGNOSIS — M791 Myalgia, unspecified site: Secondary | ICD-10-CM | POA: Diagnosis not present

## 2020-01-19 MED ORDER — TIZANIDINE HCL 4 MG PO TABS: 4.0000 mg | ORAL_TABLET | Freq: Three times a day (TID) | ORAL | 0 refills | Status: DC | PRN

## 2020-01-19 MED ORDER — NAPROXEN 500 MG PO TBEC: 500.0000 mg | DELAYED_RELEASE_TABLET | Freq: Two times a day (BID) | ORAL | 0 refills | Status: DC

## 2020-01-19 MED ORDER — METHYLPREDNISOLONE ACETATE 80 MG/ML IJ SUSP
80.0000 mg | Freq: Once | INTRAMUSCULAR | Status: AC
  Administered 2020-01-19: 80 mg via INTRAMUSCULAR

## 2020-01-19 NOTE — Progress Notes (Signed)
Assessment & Plan:  1. Muscle pain - Encouraged use of heating pad and muscle rub in addition to prescribed medication. Patient understands not to take naproxen and ibuprofen together. Encouraged a massage if he can afford it. Note provided to keep him out of work until Monday.  - methylPREDNISolone acetate (DEPO-MEDROL) injection 80 mg - naproxen (EC NAPROSYN) 500 MG EC tablet; Take 1 tablet (500 mg total) by mouth 2 (two) times daily with a meal.  Dispense: 60 tablet; Refill: 0 - tiZANidine (ZANAFLEX) 4 MG tablet; Take 1 tablet (4 mg total) by mouth every 8 (eight) hours as needed for muscle spasms.  Dispense: 30 tablet; Refill: 0   Follow up plan: No follow-ups on file.  [name], MSN, APRN, FNP-C [facility]  Subjective:   Patient ID: Chase Christensen, male    DOB: June 27, 1971, 49 y.o.   MRN: [id number]  HPI: Chase Christensen is a 49 y.o. male presenting on 01/19/2020 for Shoulder Pain (Patient states he has been having right shoulder pain x 1 month.)  Patient c/o pain between his right shoulder and neck/spine in the muscular area. The onset of the pain was one month ago.  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 The pain occurs every day. Patient describes pain as a dull ache when sitting still, stabbing when he is trying to sleep at night or hits a bump going down the road, and Alarid when he coughs. Patient reports at times the back of his right hand has a burning sensation in it. Symptoms are diminished by Ibuprofen, but patient states he can't just walk around with a bottle of Ibuprofen all the time.   Limited activities include: all activities. No stiffness, no weakness, no swelling is reported.    ROS: Negative unless specifically indicated above in HPI.   Relevant past medical history reviewed and updated as indicated.   Allergies and medications reviewed and  updated.   Current Outpatient Medications:    albuterol (VENTOLIN HFA) 108 (90 Base) MCG/ACT inhaler, Inhale 2 puffs into the lungs every 6 (six) hours as needed for wheezing or shortness of breath., Disp: 18 g, Rfl: 2   allopurinol (ZYLOPRIM) 100 MG tablet, Take 2 tablets (200 mg total) by mouth daily., Disp: 180 tablet, Rfl: 1   amLODipine (NORVASC) 10 MG tablet, Take 1 tablet (10 mg total) by mouth daily., Disp: 90 tablet, Rfl: 1   azithromycin (ZITHROMAX) 250 MG tablet, Take 500 mg once, then 250 mg for four days, Disp: 6 tablet, Rfl: 0   ibuprofen (ADVIL,MOTRIN) 800 MG tablet, Take 1 tablet (800 mg total) by mouth every 8 (eight) hours as needed., Disp: 21 tablet, Rfl: 0   lisinopril (ZESTRIL) 40 MG tablet, Take 1 tablet (40 mg total) by mouth daily., Disp: 90 tablet, Rfl: 1   pantoprazole (PROTONIX) 40 MG tablet, Take 1 tablet (40 mg total) by mouth daily before breakfast., Disp: 90 tablet, Rfl: 1   predniSONE (STERAPRED UNI-PAK 21 TAB) 10 MG (21) TBPK tablet, Use as directed, Disp: 21 tablet, Rfl: 0   sildenafil (REVATIO) 20 MG tablet, TAKE 1-5 TABLETS DAILY AS NEEDED, Disp: 50 tablet, Rfl: 3   sildenafil (VIAGRA) 100 MG tablet, Take 1 tablet (100 mg total) by mouth daily as needed for erectile dysfunction., Disp: 30 tablet, Rfl: 2  Allergies  Allergen Reactions   Penicillins Rash    Objective:  BP 122/89    Pulse 81    Temp 98.2 F (36.8 C) (Temporal)    Ht 6\' 3"  (1.905 m)    Wt 261 lb (118.4 kg)    SpO2 92%    BMI 32.62 kg/m    Physical Exam Vitals reviewed.  Constitutional:      General: He is not in acute distress.    Appearance: Normal appearance. He is not ill-appearing, toxic-appearing or diaphoretic.  HENT:     Head: Normocephalic and atraumatic.  Eyes:     General: No scleral icterus.       Right eye: No discharge.        Left eye: No discharge.     Conjunctiva/sclera: Conjunctivae normal.  Cardiovascular:     Rate and Rhythm: Normal rate.   Pulmonary:     Effort: Pulmonary effort is normal. No respiratory distress.  Musculoskeletal:        General: Normal range of motion.     Right shoulder: Normal.     Cervical back: Normal and normal range of motion. Muscular tenderness present.       Back:     Comments: Area of tenderness.  Skin:    General: Skin is warm and dry.  Neurological:     Mental Status: He is alert and oriented to person, place, and time. Mental status is at baseline.  Psychiatric:        Mood and Affect: Mood normal.        Behavior: Behavior normal.        Thought Content: Thought content normal.        Judgment: Judgment normal.

## 2020-01-19 NOTE — Patient Instructions (Signed)
Heating pad. Muscle rubs - Biofreeze, [place] Gel, or Horse Liniment. Rest.

## 2020-02-01 ENCOUNTER — Telehealth: Payer: Self-pay | Admitting: Family Medicine

## 2020-02-01 DIAGNOSIS — M791 Myalgia, unspecified site: Secondary | ICD-10-CM

## 2020-02-01 DIAGNOSIS — M25511 Pain in right shoulder: Secondary | ICD-10-CM

## 2020-02-01 NOTE — Telephone Encounter (Signed)
Is he taking the naproxen, tizanidine, both?

## 2020-02-01 NOTE — Telephone Encounter (Signed)
  Incoming Patient Call  02/01/2020  What symptoms do you have? Shoulders are getting worse again. Medication was working but now it is not phasing it like he has got used to it. Needs something else called in or be referred to someone  How long have you been sick? One month  Have you been seen for this problem? 7-15  If your provider decides to give you a prescription, which pharmacy would you like for it to be sent to? [facility] in [place]   Patient informed that this information will be sent to the clinical staff for review and that they should receive a follow up call.

## 2020-02-02 NOTE — Telephone Encounter (Signed)
Pt is taken both. He is taking the Tizanidine in the evenings. The Naproxen he is taking twice a day

## 2020-02-04 MED ORDER — METHOCARBAMOL 500 MG PO TABS: 500.0000 mg | ORAL_TABLET | Freq: Three times a day (TID) | ORAL | 0 refills | Status: DC | PRN

## 2020-02-04 NOTE — Telephone Encounter (Signed)
Robaxin sent to pharmacy so patient can have a muscle relaxer during the day that does not make him sleepy. I did also put in a referral to ortho.

## 2020-02-06 NOTE — Telephone Encounter (Signed)
Patient aware and verbalizes understanding. 

## 2020-02-13 DIAGNOSIS — M25511 Pain in right shoulder: Secondary | ICD-10-CM | POA: Diagnosis not present

## 2020-02-13 DIAGNOSIS — M5412 Radiculopathy, cervical region: Secondary | ICD-10-CM | POA: Diagnosis not present

## 2020-02-13 DIAGNOSIS — M5030 Other cervical disc degeneration, unspecified cervical region: Secondary | ICD-10-CM | POA: Diagnosis not present

## 2020-02-29 ENCOUNTER — Ambulatory Visit: Payer: BLUE CROSS/BLUE SHIELD | Admitting: Orthopaedic Surgery

## 2020-02-29 ENCOUNTER — Other Ambulatory Visit: Payer: Self-pay

## 2020-04-08 ENCOUNTER — Other Ambulatory Visit: Payer: Self-pay | Admitting: Family Medicine

## 2020-04-08 DIAGNOSIS — E790 Hyperuricemia without signs of inflammatory arthritis and tophaceous disease: Secondary | ICD-10-CM

## 2020-04-08 DIAGNOSIS — I10 Essential (primary) hypertension: Secondary | ICD-10-CM

## 2020-04-09 ENCOUNTER — Other Ambulatory Visit: Payer: Self-pay | Admitting: Family Medicine

## 2020-04-09 DIAGNOSIS — E790 Hyperuricemia without signs of inflammatory arthritis and tophaceous disease: Secondary | ICD-10-CM

## 2020-04-18 ENCOUNTER — Other Ambulatory Visit: Payer: Self-pay | Admitting: Family Medicine

## 2020-04-18 DIAGNOSIS — E790 Hyperuricemia without signs of inflammatory arthritis and tophaceous disease: Secondary | ICD-10-CM

## 2020-04-27 ENCOUNTER — Other Ambulatory Visit: Payer: Self-pay | Admitting: Family Medicine

## 2020-04-27 DIAGNOSIS — I10 Essential (primary) hypertension: Secondary | ICD-10-CM

## 2020-04-27 DIAGNOSIS — K219 Gastro-esophageal reflux disease without esophagitis: Secondary | ICD-10-CM

## 2020-04-28 ENCOUNTER — Other Ambulatory Visit: Payer: Self-pay | Admitting: Nurse Practitioner

## 2020-04-28 DIAGNOSIS — E790 Hyperuricemia without signs of inflammatory arthritis and tophaceous disease: Secondary | ICD-10-CM

## 2020-04-28 DIAGNOSIS — I10 Essential (primary) hypertension: Secondary | ICD-10-CM

## 2020-04-28 DIAGNOSIS — K219 Gastro-esophageal reflux disease without esophagitis: Secondary | ICD-10-CM

## 2020-04-28 MED ORDER — PANTOPRAZOLE SODIUM 40 MG PO TBEC: 40.0000 mg | DELAYED_RELEASE_TABLET | Freq: Every day | ORAL | 0 refills | Status: DC

## 2020-04-28 MED ORDER — LISINOPRIL 40 MG PO TABS: 40.0000 mg | ORAL_TABLET | Freq: Every day | ORAL | 0 refills | Status: DC

## 2020-04-28 MED ORDER — AMLODIPINE BESYLATE 10 MG PO TABS: 10.0000 mg | ORAL_TABLET | Freq: Every day | ORAL | 0 refills | Status: DC

## 2020-04-28 MED ORDER — ALLOPURINOL 100 MG PO TABS: 200.0000 mg | ORAL_TABLET | Freq: Every day | ORAL | 0 refills | Status: DC

## 2020-06-18 ENCOUNTER — Encounter: Payer: Self-pay | Admitting: Nurse Practitioner

## 2020-06-18 ENCOUNTER — Ambulatory Visit: Payer: BC Managed Care – PPO | Admitting: Nurse Practitioner

## 2020-06-18 ENCOUNTER — Other Ambulatory Visit: Payer: Self-pay

## 2020-06-18 VITALS — BP 117/72 | HR 85 | Temp 97.1°F | Resp 20 | Ht 75.0 in | Wt 271.0 lb

## 2020-06-18 DIAGNOSIS — I10 Essential (primary) hypertension: Secondary | ICD-10-CM

## 2020-06-18 DIAGNOSIS — M1009 Idiopathic gout, multiple sites: Secondary | ICD-10-CM

## 2020-06-18 DIAGNOSIS — E790 Hyperuricemia without signs of inflammatory arthritis and tophaceous disease: Secondary | ICD-10-CM

## 2020-06-18 DIAGNOSIS — M791 Myalgia, unspecified site: Secondary | ICD-10-CM

## 2020-06-18 DIAGNOSIS — F17200 Nicotine dependence, unspecified, uncomplicated: Secondary | ICD-10-CM | POA: Diagnosis not present

## 2020-06-18 DIAGNOSIS — K219 Gastro-esophageal reflux disease without esophagitis: Secondary | ICD-10-CM | POA: Diagnosis not present

## 2020-06-18 MED ORDER — ALLOPURINOL 100 MG PO TABS: 200.0000 mg | ORAL_TABLET | Freq: Every day | ORAL | 0 refills | Status: DC

## 2020-06-18 MED ORDER — NAPROXEN 500 MG PO TBEC
500.0000 mg | DELAYED_RELEASE_TABLET | Freq: Two times a day (BID) | ORAL | 0 refills | Status: DC
Start: 2020-06-18 — End: 2020-07-16

## 2020-06-18 NOTE — Assessment & Plan Note (Signed)
Patient hypertension well controlled on current medication.  Amlodipine 10 mg tablet by mouth daily, lisinopril 40 mg tablet by mouth daily.  Labs completed CBC, CMP, lipid panel. Refill sent to pharmacy Education provided with printed handouts given.  Continue healthy low sodium diet and exercise as tolerated.

## 2020-06-18 NOTE — Progress Notes (Signed)
Established Patient Office Visit  Subjective:  Patient ID: Chase Christensen, male    DOB: 08-19-1970  Age: 49 y.o. MRN: [id number]  CC:  Chief Complaint  Patient presents with   Medical Management of Chronic Issues    And refills     HPI Chase Christensen is a 49 year old male who presents for follow up of hypertension. Patient was diagnosed in 05/03/2015.. The patient is tolerating the medication well without side effects. Compliance with treatment has been good; including taking medication as directed , maintains a healthy diet and regular exercise regimen , and following up as directed.  Current medication Norvasc 10 mg tablet 1 daily.,  Lisinopril 40 mg tablet by mouth daily.   GERD, Follow up:  The patient was last seen for GERD 2 years ago. Changes made since that visit include none.  He reports good compliance with treatment. He is not having side effects.  He IS experiencing no symptoms. He is NOT experiencing belching, belching and eructation, bilious reflux, chest pain, choking on food, cough, difficulty swallowing or dysphagia  -----------------------------------------------------------------------------------------   Past Medical History:  Diagnosis Date   COPD (chronic obstructive pulmonary disease) (HCC)    ED (erectile dysfunction)    Gout    Hypertension    Pneumonia     Past Surgical History:  Procedure Laterality Date   KNEE ARTHROSCOPY Right 03/15/2015   WISDOM TOOTH EXTRACTION      Family History  Problem Relation Age of Onset   Hypertension Mother    Diabetes Maternal Grandmother    Lung cancer Maternal Grandfather    Pancreatic cancer Paternal Grandmother    Colon cancer Paternal Uncle    Colon polyps Cousin    Cancer Other        all paternal uncles and anunts passed with cancer    Social History   Socioeconomic History   Marital status: Legally Separated    Spouse name: Not on file   Number of children: 1   Years of  education: Not on file   Highest education level: Not on file  Occupational History   Occupation: servic tech/ [employer]  Tobacco Use   Smoking status: Current Every Day Smoker    Packs/day: 0.25    Types: Cigarettes   Smokeless tobacco: Former Neurosurgeon    Types: Associate Professor Use: Never used  Substance and Sexual Activity   Alcohol use: Yes    Comment: occas   Drug use: No   Sexual activity: Not on file  Other Topics Concern   Not on file  Social History Narrative   Not on file   Social Determinants of Health   Financial Resource Strain: Not on file  Food Insecurity: Not on file  Transportation Needs: Not on file  Physical Activity: Not on file  Stress: Not on file  Social Connections: Not on file  Intimate Partner Violence: Not on file    Outpatient Medications Prior to Visit  Medication Sig Dispense Refill   albuterol (VENTOLIN HFA) 108 (90 Base) MCG/ACT inhaler Inhale 2 puffs into the lungs every 6 (six) hours as needed for wheezing or shortness of breath. 18 g 2   allopurinol (ZYLOPRIM) 100 MG tablet Take 2 tablets (200 mg total) by mouth daily. 180 tablet 0   amLODipine (NORVASC) 10 MG tablet Take 1 tablet (10 mg total) by mouth daily. 90 tablet 0   ibuprofen (ADVIL,MOTRIN) 800 MG tablet Take 1 tablet (800 mg total)  by mouth every 8 (eight) hours as needed. 21 tablet 0   lisinopril (ZESTRIL) 40 MG tablet Take 1 tablet (40 mg total) by mouth daily. 90 tablet 0   pantoprazole (PROTONIX) 40 MG tablet Take 1 tablet (40 mg total) by mouth daily before breakfast. 90 tablet 0   sildenafil (VIAGRA) 100 MG tablet Take 1 tablet (100 mg total) by mouth daily as needed for erectile dysfunction. 30 tablet 2   gabapentin (NEURONTIN) 300 MG capsule Take 300 mg by mouth 2 (two) times daily as needed.     naproxen (EC NAPROSYN) 500 MG EC tablet Take 1 tablet (500 mg total) by mouth 2 (two) times daily with a meal. (Patient not taking: Reported on  06/18/2020) 60 tablet 0   methocarbamol (ROBAXIN) 500 MG tablet Take 1 tablet (500 mg total) by mouth 3 (three) times daily as needed for muscle spasms. 30 tablet 0   tiZANidine (ZANAFLEX) 4 MG tablet Take 1 tablet (4 mg total) by mouth every 8 (eight) hours as needed for muscle spasms. 30 tablet 0   No facility-administered medications prior to visit.    Allergies  Allergen Reactions   Penicillins Rash    ROS Review of Systems  Gastrointestinal: Negative for abdominal pain, constipation and nausea.  Neurological: Negative for dizziness and light-headedness.  All other systems reviewed and are negative.     Objective:    Physical Exam Vitals reviewed.  Constitutional:      General: He is awake.     Appearance: Normal appearance. He is well-developed and overweight.     Interventions: Face mask in place.  HENT:     Head: Normocephalic.     Nose: Nose normal.  Eyes:     Conjunctiva/sclera: Conjunctivae normal.  Cardiovascular:     Rate and Rhythm: Normal rate and regular rhythm.     Pulses: Normal pulses.     Heart sounds: Normal heart sounds.  Pulmonary:     Effort: Pulmonary effort is normal.  Abdominal:     General: Bowel sounds are normal.  Musculoskeletal:        General: Normal range of motion.  Skin:    General: Skin is warm.  Neurological:     Mental Status: He is alert and oriented to person, place, and time.  Psychiatric:        Mood and Affect: Mood normal.        Behavior: Behavior normal. Behavior is cooperative.     BP 117/72    Pulse 85    Temp (!) 97.1 F (36.2 C)    Resp 20    Ht 6\' 3"  (1.905 m)    Wt 271 lb (122.9 kg)    SpO2 94%    BMI 33.87 kg/m  Wt Readings from Last 3 Encounters:  06/18/20 271 lb (122.9 kg)  01/19/20 261 lb (118.4 kg)  10/13/19 258 lb (117 kg)      Lab Results  Component Value Date   TSH 2.280 07/21/2017   Lab Results  Component Value Date   WBC 14.9 (H) 10/13/2019   HGB 17.6 10/13/2019   HCT 51.2 (H)  10/13/2019   MCV 95 10/13/2019   PLT 228 10/13/2019   Lab Results  Component Value Date   NA 140 10/13/2019   K 4.3 10/13/2019   CO2 21 10/13/2019   GLUCOSE 105 (H) 10/13/2019   BUN 15 10/13/2019   CREATININE 1.06 10/13/2019   BILITOT 0.6 10/13/2019   ALKPHOS 103 10/13/2019  AST 29 10/13/2019   ALT 29 10/13/2019   PROT 6.9 10/13/2019   ALBUMIN 4.5 10/13/2019   CALCIUM 9.8 10/13/2019   ANIONGAP 7 03/07/2018   Lab Results  Component Value Date   CHOL 136 10/13/2019   Lab Results  Component Value Date   HDL 32 (L) 10/13/2019   Lab Results  Component Value Date   LDLCALC 77 10/13/2019   Lab Results  Component Value Date   TRIG 155 (H) 10/13/2019   Lab Results  Component Value Date   CHOLHDL 4.3 10/13/2019   Lab Results  Component Value Date   HGBA1C 5.9 01/27/2018      Assessment & Plan:   Problem List Items Addressed This Visit      Cardiovascular and Mediastinum   Essential hypertension - Primary    Patient hypertension well controlled on current medication.  Amlodipine 10 mg tablet by mouth daily, lisinopril 40 mg tablet by mouth daily.  Labs completed CBC, CMP, lipid panel. Refill sent to pharmacy Education provided with printed handouts given.  Continue healthy low sodium diet and exercise as tolerated.      Relevant Orders   CBC with Differential   Comprehensive metabolic panel   Lipid Panel     Digestive   Gastroesophageal reflux disease    Gastroesophageal reflux disease well managed on Protonix 40 mg tablet by mouth daily.  No changes to current medication dose, refill sent to pharmacy.  Education provided with printed handouts given. Continue healthy diet and exercise regimen as tolerated.   Following up with worsening or unresolved symptoms.        Other   Tobacco use disorder   Gout    Other Visit Diagnoses    Elevated blood uric acid level       Relevant Medications   allopurinol (ZYLOPRIM) 100 MG tablet   Muscle pain        Relevant Medications   naproxen (EC NAPROSYN) 500 MG EC tablet      Meds ordered this encounter  Medications   allopurinol (ZYLOPRIM) 100 MG tablet    Sig: Take 2 tablets (200 mg total) by mouth daily.    Dispense:  180 tablet    Refill:  0    Order Specific Question:   Supervising Provider    Answer:   Arville Care A [[id number]]   naproxen (EC NAPROSYN) 500 MG EC tablet    Sig: Take 1 tablet (500 mg total) by mouth 2 (two) times daily with a meal.    Dispense:  60 tablet    Refill:  0    Order Specific Question:   Supervising Provider    Answer:   Arville Care A [[id number]]    Follow-up: Return in about 6 months (around 12/17/2020).    Daryll Drown, NP

## 2020-06-18 NOTE — Assessment & Plan Note (Signed)
Gastroesophageal reflux disease well managed on Protonix 40 mg tablet by mouth daily.  No changes to current medication dose, refill sent to pharmacy.  Education provided with printed handouts given. Continue healthy diet and exercise regimen as tolerated.   Following up with worsening or unresolved symptoms.

## 2020-06-18 NOTE — Patient Instructions (Signed)

## 2020-06-19 LAB — CBC WITH DIFFERENTIAL/PLATELET
Basophils Absolute: 0.1 10*3/uL (ref 0.0–0.2)
Basos: 1 %
EOS (ABSOLUTE): 0.5 10*3/uL — ABNORMAL HIGH (ref 0.0–0.4)
Eos: 5 %
Hematocrit: 48.1 % (ref 37.5–51.0)
Hemoglobin: 17.1 g/dL (ref 13.0–17.7)
Immature Grans (Abs): 0.1 10*3/uL (ref 0.0–0.1)
Immature Granulocytes: 1 %
Lymphocytes Absolute: 2.8 10*3/uL (ref 0.7–3.1)
Lymphs: 26 %
MCH: 33.4 pg — ABNORMAL HIGH (ref 26.6–33.0)
MCHC: 35.6 g/dL (ref 31.5–35.7)
MCV: 94 fL (ref 79–97)
Monocytes Absolute: 0.8 10*3/uL (ref 0.1–0.9)
Monocytes: 7 %
Neutrophils Absolute: 6.6 10*3/uL (ref 1.4–7.0)
Neutrophils: 60 %
Platelets: 241 10*3/uL (ref 150–450)
RBC: 5.12 x10E6/uL (ref 4.14–5.80)
RDW: 11.6 % (ref 11.6–15.4)
WBC: 10.7 10*3/uL (ref 3.4–10.8)

## 2020-06-19 LAB — COMPREHENSIVE METABOLIC PANEL
ALT: 46 IU/L — ABNORMAL HIGH (ref 0–44)
AST: 32 IU/L (ref 0–40)
Albumin/Globulin Ratio: 1.8 (ref 1.2–2.2)
Albumin: 4.3 g/dL (ref 4.0–5.0)
Alkaline Phosphatase: 97 IU/L (ref 44–121)
BUN/Creatinine Ratio: 11 (ref 9–20)
BUN: 12 mg/dL (ref 6–24)
Bilirubin Total: 0.5 mg/dL (ref 0.0–1.2)
CO2: 23 mmol/L (ref 20–29)
Calcium: 9.6 mg/dL (ref 8.7–10.2)
Chloride: 105 mmol/L (ref 96–106)
Creatinine, Ser: 1.1 mg/dL (ref 0.76–1.27)
GFR calc Af Amer: 91 mL/min/{1.73_m2} (ref >59)
GFR calc non Af Amer: 79 mL/min/{1.73_m2} (ref >59)
Globulin, Total: 2.4 g/dL (ref 1.5–4.5)
Glucose: 92 mg/dL (ref 65–99)
Potassium: 4.4 mmol/L (ref 3.5–5.2)
Sodium: 143 mmol/L (ref 134–144)
Total Protein: 6.7 g/dL (ref 6.0–8.5)

## 2020-06-19 LAB — LIPID PANEL
Chol/HDL Ratio: 4.9 ratio (ref 0.0–5.0)
Cholesterol, Total: 151 mg/dL (ref 100–199)
HDL: 31 mg/dL — ABNORMAL LOW (ref >39)
LDL Chol Calc (NIH): 87 mg/dL (ref 0–99)
Triglycerides: 194 mg/dL — ABNORMAL HIGH (ref 0–149)
VLDL Cholesterol Cal: 33 mg/dL (ref 5–40)

## 2020-07-16 ENCOUNTER — Other Ambulatory Visit: Payer: Self-pay | Admitting: *Deleted

## 2020-07-16 DIAGNOSIS — M791 Myalgia, unspecified site: Secondary | ICD-10-CM

## 2020-07-16 MED ORDER — NAPROXEN 500 MG PO TBEC: 500.0000 mg | DELAYED_RELEASE_TABLET | Freq: Two times a day (BID) | ORAL | 1 refills | Status: DC

## 2020-07-19 ENCOUNTER — Other Ambulatory Visit: Payer: Self-pay | Admitting: Nurse Practitioner

## 2020-07-19 DIAGNOSIS — I10 Essential (primary) hypertension: Secondary | ICD-10-CM

## 2020-07-31 ENCOUNTER — Other Ambulatory Visit: Payer: Self-pay | Admitting: Family Medicine

## 2020-07-31 ENCOUNTER — Other Ambulatory Visit: Payer: Self-pay | Admitting: Nurse Practitioner

## 2020-07-31 DIAGNOSIS — I10 Essential (primary) hypertension: Secondary | ICD-10-CM

## 2020-07-31 DIAGNOSIS — K219 Gastro-esophageal reflux disease without esophagitis: Secondary | ICD-10-CM

## 2020-08-02 ENCOUNTER — Other Ambulatory Visit: Payer: Self-pay | Admitting: *Deleted

## 2020-08-02 DIAGNOSIS — E790 Hyperuricemia without signs of inflammatory arthritis and tophaceous disease: Secondary | ICD-10-CM

## 2020-08-02 MED ORDER — ALLOPURINOL 100 MG PO TABS: 200.0000 mg | ORAL_TABLET | Freq: Every day | ORAL | 0 refills | Status: DC

## 2020-09-13 ENCOUNTER — Ambulatory Visit: Payer: BC Managed Care – PPO

## 2020-09-13 ENCOUNTER — Encounter: Payer: Self-pay | Admitting: Family Medicine

## 2020-09-13 ENCOUNTER — Other Ambulatory Visit: Payer: Self-pay

## 2020-09-13 ENCOUNTER — Ambulatory Visit: Payer: BC Managed Care – PPO | Admitting: Family Medicine

## 2020-09-13 VITALS — BP 134/89 | HR 79 | Temp 98.0°F | Ht 75.0 in | Wt 271.4 lb

## 2020-09-13 DIAGNOSIS — R7309 Other abnormal glucose: Secondary | ICD-10-CM

## 2020-09-13 DIAGNOSIS — I10 Essential (primary) hypertension: Secondary | ICD-10-CM | POA: Diagnosis not present

## 2020-09-13 DIAGNOSIS — K219 Gastro-esophageal reflux disease without esophagitis: Secondary | ICD-10-CM

## 2020-09-13 DIAGNOSIS — R0689 Other abnormalities of breathing: Secondary | ICD-10-CM

## 2020-09-13 DIAGNOSIS — Z125 Encounter for screening for malignant neoplasm of prostate: Secondary | ICD-10-CM | POA: Diagnosis not present

## 2020-09-13 DIAGNOSIS — M1A09X0 Idiopathic chronic gout, multiple sites, without tophus (tophi): Secondary | ICD-10-CM

## 2020-09-13 DIAGNOSIS — M109 Gout, unspecified: Secondary | ICD-10-CM | POA: Insufficient documentation

## 2020-09-13 DIAGNOSIS — M1A00X0 Idiopathic chronic gout, unspecified site, without tophus (tophi): Secondary | ICD-10-CM | POA: Insufficient documentation

## 2020-09-13 DIAGNOSIS — R8290 Unspecified abnormal findings in urine: Secondary | ICD-10-CM | POA: Diagnosis not present

## 2020-09-13 DIAGNOSIS — M791 Myalgia, unspecified site: Secondary | ICD-10-CM | POA: Diagnosis not present

## 2020-09-13 DIAGNOSIS — J302 Other seasonal allergic rhinitis: Secondary | ICD-10-CM

## 2020-09-13 LAB — URINALYSIS, COMPLETE
Bilirubin, UA: NEGATIVE
Glucose, UA: NEGATIVE
Ketones, UA: NEGATIVE
Nitrite, UA: NEGATIVE
Protein,UA: NEGATIVE
Specific Gravity, UA: 1.02 (ref 1.005–1.030)
Urobilinogen, Ur: 0.2 mg/dL (ref 0.2–1.0)
pH, UA: 5.5 (ref 5.0–7.5)

## 2020-09-13 LAB — MICROSCOPIC EXAMINATION: Epithelial Cells (non renal): NONE SEEN /hpf (ref 0–10)

## 2020-09-13 MED ORDER — ALLOPURINOL 100 MG PO TABS
200.0000 mg | ORAL_TABLET | Freq: Every day | ORAL | 1 refills | Status: DC
Start: 2020-09-13 — End: 2021-03-19

## 2020-09-13 MED ORDER — LISINOPRIL 40 MG PO TABS: 40.0000 mg | ORAL_TABLET | Freq: Every day | ORAL | 1 refills | Status: DC

## 2020-09-13 MED ORDER — AMLODIPINE BESYLATE 10 MG PO TABS: 10.0000 mg | ORAL_TABLET | Freq: Every day | ORAL | 1 refills | Status: DC

## 2020-09-13 MED ORDER — PANTOPRAZOLE SODIUM 40 MG PO TBEC: 40.0000 mg | DELAYED_RELEASE_TABLET | Freq: Every day | ORAL | 1 refills | Status: DC

## 2020-09-13 MED ORDER — NAPROXEN 500 MG PO TBEC: 500.0000 mg | DELAYED_RELEASE_TABLET | Freq: Two times a day (BID) | ORAL | 1 refills | Status: DC

## 2020-09-13 NOTE — Addendum Note (Signed)
Addended by: Lorelee Cover C on: 09/13/2020 03:02 PM   Modules accepted: Orders

## 2020-09-13 NOTE — Progress Notes (Signed)
Assessment & Plan:  1. Essential hypertension Well controlled on current regimen.  - amLODipine (NORVASC) 10 MG tablet; Take 1 tablet (10 mg total) by mouth daily.  Dispense: 90 tablet; Refill: 1 - lisinopril (ZESTRIL) 40 MG tablet; Take 1 tablet (40 mg total) by mouth daily.  Dispense: 90 tablet; Refill: 1 - CMP14+EGFR - Lipid panel  2. Idiopathic chronic gout of multiple sites without tophus Well controlled on current regimen.  - allopurinol (ZYLOPRIM) 100 MG tablet; Take 2 tablets (200 mg total) by mouth daily.  Dispense: 180 tablet; Refill: 1 - CMP14+EGFR  3. Gastroesophageal reflux disease without esophagitis Well controlled on current regimen.  - pantoprazole (PROTONIX) 40 MG tablet; Take 1 tablet (40 mg total) by mouth daily before breakfast.  Dispense: 90 tablet; Refill: 1 - CMP14+EGFR  4. Muscle pain Well controlled on current regimen.  - naproxen (EC NAPROSYN) 500 MG EC tablet; Take 1 tablet (500 mg total) by mouth 2 (two) times daily with a meal.  Dispense: 180 tablet; Refill: 1  5. Foul smelling urine - Urinalysis, Complete  6. Abnormal breath sounds - DG Chest 2 View; Future  7. Seasonal allergies Encouraged to add Flonase to daily regimen. Continue antihistamine.  8. Prostate cancer screening - PSA, total and free   Return in about 6 months (around 03/16/2021) for annual physical.  Chase Boston, MSN, APRN, FNP-C [facility]  Subjective:    Patient ID: Chase Christensen, male    DOB: May 11, 1971, 50 y.o.   MRN: [id number]  Patient Care Team: Gwenlyn Fudge, FNP as PCP - General (Family Medicine)   Chief Complaint:  Chief Complaint  Patient presents with  . Hypertension    Check up of chronic medical conditions   . Facial Swelling    Patient has been having right under eye swelling x 1 week    HPI: Chase Christensen is a 50 y.o. male presenting on 09/13/2020 for Hypertension (Check up of chronic medical conditions/) and Facial  Swelling (Patient has been having right under eye swelling x 1 week)  Gout: Patient reports he has only had 1 gout flare since the last time he saw me 8 months ago.  He is taking the allopurinol 200 mg once daily.  Hypertension: Patient reports his blood pressure is well controlled at home.  He has only had 2 sips of coffee this morning to prepare for his fasting lab work.  New complaints: He is having some swelling below his eyes which is worse on the right.  He does report his eyes water frequently.  He does have allergies and is taking an antihistamine.  Patient reports his urine has been pretty dark for the past month.  He states it also has an abnormal odor to it.  He does have urinary frequency but feels it is related to one of his medications.  Social history:  Relevant past medical, surgical, family and social history reviewed and updated as indicated. Interim medical history since our last visit reviewed.  Allergies and medications reviewed and updated.  DATA REVIEWED: CHART IN EPIC  ROS: Negative unless specifically indicated above in HPI.    Current Outpatient Medications:  .  albuterol (VENTOLIN HFA) 108 (90 Base) MCG/ACT inhaler, Inhale 2 puffs into the lungs every 6 (six) hours as needed for wheezing or shortness of breath., Disp: 18 g, Rfl: 2 .  allopurinol (ZYLOPRIM) 100 MG tablet, Take 2 tablets (200 mg total) by mouth daily., Disp: 180 tablet, Rfl:  0 .  amLODipine (NORVASC) 10 MG tablet, TAKE 1 TABLET BY MOUTH EVERY DAY, Disp: 90 tablet, Rfl: 0 .  ibuprofen (ADVIL,MOTRIN) 800 MG tablet, Take 1 tablet (800 mg total) by mouth every 8 (eight) hours as needed., Disp: 21 tablet, Rfl: 0 .  lisinopril (ZESTRIL) 40 MG tablet, TAKE 1 TABLET BY MOUTH EVERY DAY, Disp: 90 tablet, Rfl: 0 .  naproxen (EC NAPROSYN) 500 MG EC tablet, Take 1 tablet (500 mg total) by mouth 2 (two) times daily with a meal., Disp: 60 tablet, Rfl: 1 .  pantoprazole (PROTONIX) 40 MG tablet, TAKE 1 TABLET  BY MOUTH DAILY BEFORE BREAKFAST, Disp: 90 tablet, Rfl: 0 .  sildenafil (VIAGRA) 100 MG tablet, Take 1 tablet (100 mg total) by mouth daily as needed for erectile dysfunction., Disp: 30 tablet, Rfl: 2   Allergies  Allergen Reactions  . Penicillins Rash   Past Medical History:  Diagnosis Date  . COPD (chronic obstructive pulmonary disease) (HCC)   . ED (erectile dysfunction)   . Gout   . Hypertension   . Pneumonia     Past Surgical History:  Procedure Laterality Date  . KNEE ARTHROSCOPY Right 03/15/2015  . WISDOM TOOTH EXTRACTION      Social History   Socioeconomic History  . Marital status: Legally Separated    Spouse name: Not on file  . Number of children: 1  . Years of education: Not on file  . Highest education level: Not on file  Occupational History  . Occupation: servic tech/ [employer]  Tobacco Use  . Smoking status: Current Every Day Smoker    Packs/day: 0.25    Types: Cigarettes  . Smokeless tobacco: Former Neurosurgeon    Types: Engineer, drilling  . Vaping Use: Never used  Substance and Sexual Activity  . Alcohol use: Yes    Comment: occas  . Drug use: No  . Sexual activity: Not on file  Other Topics Concern  . Not on file  Social History Narrative  . Not on file   Social Determinants of Health   Financial Resource Strain: Not on file  Food Insecurity: Not on file  Transportation Needs: Not on file  Physical Activity: Not on file  Stress: Not on file  Social Connections: Not on file  Intimate Partner Violence: Not on file        Objective:    BP 134/89   Pulse 79   Temp 98 F (36.7 C) (Temporal)   Ht 6\' 3"  (1.905 m)   Wt 271 lb 6.4 oz (123.1 kg)   SpO2 96%   BMI 33.92 kg/m   Wt Readings from Last 3 Encounters:  09/13/20 271 lb 6.4 oz (123.1 kg)  06/18/20 271 lb (122.9 kg)  01/19/20 261 lb (118.4 kg)    Physical Exam Vitals reviewed.  Constitutional:      General: He is not in acute distress.    Appearance: Normal appearance. He is  obese. He is not ill-appearing, toxic-appearing or diaphoretic.  HENT:     Head: Normocephalic and atraumatic.  Eyes:     General: Allergic shiner present. No scleral icterus.       Right eye: No discharge.        Left eye: No discharge.     Conjunctiva/sclera: Conjunctivae normal.  Cardiovascular:     Rate and Rhythm: Normal rate and regular rhythm.     Heart sounds: Normal heart sounds. No murmur heard. No friction rub. No gallop.  Pulmonary:     Effort: Pulmonary effort is normal. No respiratory distress.     Breath sounds: No stridor. Examination of the right-lower field reveals rales. Rales present. No decreased breath sounds, wheezing or rhonchi.  Musculoskeletal:        General: Normal range of motion.     Cervical back: Normal range of motion.  Skin:    General: Skin is warm and dry.  Neurological:     Mental Status: He is alert and oriented to person, place, and time. Mental status is at baseline.  Psychiatric:        Mood and Affect: Mood normal.        Behavior: Behavior normal.        Thought Content: Thought content normal.        Judgment: Judgment normal.     Lab Results  Component Value Date   TSH 2.280 07/21/2017   Lab Results  Component Value Date   WBC 10.7 06/18/2020   HGB 17.1 06/18/2020   HCT 48.1 06/18/2020   MCV 94 06/18/2020   PLT 241 06/18/2020   Lab Results  Component Value Date   NA 143 06/18/2020   K 4.4 06/18/2020   CO2 23 06/18/2020   GLUCOSE 92 06/18/2020   BUN 12 06/18/2020   CREATININE 1.10 06/18/2020   BILITOT 0.5 06/18/2020   ALKPHOS 97 06/18/2020   AST 32 06/18/2020   ALT 46 (H) 06/18/2020   PROT 6.7 06/18/2020   ALBUMIN 4.3 06/18/2020   CALCIUM 9.6 06/18/2020   ANIONGAP 7 03/07/2018   Lab Results  Component Value Date   CHOL 151 06/18/2020   Lab Results  Component Value Date   HDL 31 (L) 06/18/2020   Lab Results  Component Value Date   LDLCALC 87 06/18/2020   Lab Results  Component Value Date   TRIG 194  (H) 06/18/2020   Lab Results  Component Value Date   CHOLHDL 4.9 06/18/2020   Lab Results  Component Value Date   HGBA1C 5.9 01/27/2018

## 2020-09-14 LAB — LIPID PANEL
Chol/HDL Ratio: 4.2 ratio (ref 0.0–5.0)
Cholesterol, Total: 134 mg/dL (ref 100–199)
HDL: 32 mg/dL — ABNORMAL LOW (ref >39)
LDL Chol Calc (NIH): 80 mg/dL (ref 0–99)
Triglycerides: 123 mg/dL (ref 0–149)
VLDL Cholesterol Cal: 22 mg/dL (ref 5–40)

## 2020-09-14 LAB — CMP14+EGFR
ALT: 37 IU/L (ref 0–44)
AST: 29 IU/L (ref 0–40)
Albumin/Globulin Ratio: 2.3 — ABNORMAL HIGH (ref 1.2–2.2)
Albumin: 4.2 g/dL (ref 4.0–5.0)
Alkaline Phosphatase: 103 IU/L (ref 44–121)
BUN/Creatinine Ratio: 14 (ref 9–20)
BUN: 15 mg/dL (ref 6–24)
Bilirubin Total: 0.2 mg/dL (ref 0.0–1.2)
CO2: 21 mmol/L (ref 20–29)
Calcium: 9.3 mg/dL (ref 8.7–10.2)
Chloride: 106 mmol/L (ref 96–106)
Creatinine, Ser: 1.09 mg/dL (ref 0.76–1.27)
Globulin, Total: 1.8 g/dL (ref 1.5–4.5)
Glucose: 134 mg/dL — ABNORMAL HIGH (ref 65–99)
Potassium: 4.5 mmol/L (ref 3.5–5.2)
Sodium: 142 mmol/L (ref 134–144)
Total Protein: 6 g/dL (ref 6.0–8.5)
eGFR: 84 mL/min/{1.73_m2} (ref >59)

## 2020-09-14 LAB — PSA, TOTAL AND FREE
PSA, Free Pct: 23.1 %
PSA, Free: 0.37 ng/mL
Prostate Specific Ag, Serum: 1.6 ng/mL (ref 0.0–4.0)

## 2020-09-15 LAB — URINE CULTURE: Organism ID, Bacteria: NO GROWTH

## 2020-09-17 DIAGNOSIS — R7309 Other abnormal glucose: Secondary | ICD-10-CM | POA: Diagnosis not present

## 2020-09-17 LAB — BAYER DCA HB A1C WAIVED: HB A1C (BAYER DCA - WAIVED): 6.3 % (ref <7.0)

## 2020-09-17 NOTE — Addendum Note (Signed)
Addended by: Angela Adam on: 09/17/2020 09:12 AM   Modules accepted: Orders

## 2020-09-18 ENCOUNTER — Encounter: Payer: Self-pay | Admitting: Family Medicine

## 2020-09-18 DIAGNOSIS — R7303 Prediabetes: Secondary | ICD-10-CM

## 2020-09-18 DIAGNOSIS — Z87898 Personal history of other specified conditions: Secondary | ICD-10-CM | POA: Insufficient documentation

## 2020-09-18 HISTORY — DX: Prediabetes: R73.03

## 2020-10-19 ENCOUNTER — Other Ambulatory Visit: Payer: Self-pay | Admitting: Family Medicine

## 2020-10-19 DIAGNOSIS — N529 Male erectile dysfunction, unspecified: Secondary | ICD-10-CM

## 2020-10-22 NOTE — Telephone Encounter (Signed)
Last office visit 09/13/20 Last refill 10/13/19, #30, 2 refills

## 2020-11-21 ENCOUNTER — Ambulatory Visit: Payer: BC Managed Care – PPO | Admitting: Family Medicine

## 2020-11-21 ENCOUNTER — Encounter: Payer: Self-pay | Admitting: Family Medicine

## 2020-11-21 DIAGNOSIS — J440 Chronic obstructive pulmonary disease with acute lower respiratory infection: Secondary | ICD-10-CM | POA: Diagnosis not present

## 2020-11-21 DIAGNOSIS — R059 Cough, unspecified: Secondary | ICD-10-CM | POA: Diagnosis not present

## 2020-11-21 LAB — VERITOR FLU A/B WAIVED
Influenza A: NEGATIVE
Influenza B: NEGATIVE

## 2020-11-21 MED ORDER — DOXYCYCLINE HYCLATE 100 MG PO TABS: 100.0000 mg | ORAL_TABLET | Freq: Two times a day (BID) | ORAL | 0 refills | Status: AC

## 2020-11-21 MED ORDER — HYDROCODONE BIT-HOMATROP MBR 5-1.5 MG/5ML PO SOLN: 5.0000 mL | Freq: Three times a day (TID) | ORAL | 0 refills | Status: DC | PRN

## 2020-11-21 NOTE — Progress Notes (Signed)
Virtual Visit  Note Due to COVID-19 pandemic this visit was conducted virtually. This visit type was conducted due to national recommendations for restrictions regarding the COVID-19 Pandemic (e.g. social distancing, sheltering in place) in an effort to limit this patient's exposure and mitigate transmission in our community. All issues noted in this document were discussed and addressed.  A physical exam was not performed with this format.  I connected with Chase Christensen on 11/21/20 at 1054 by telephone and verified that I am speaking with the correct person using two identifiers. Chase Christensen is currently located at work and no one is currently with him during the visit. The provider, Gabriel Earing, FNP is located in their office at time of visit.  I discussed the limitations, risks, security and privacy concerns of performing an evaluation and management service by telephone and the availability of in person appointments. I also discussed with the patient that there may be a patient responsible charge related to this service. The patient expressed understanding and agreed to proceed.  CC: cough  History and Present Illness:  HPI  Chase Christensen reports cough and congestion x 6 days. He also has ear and facial pressure. He has a history of COPD and has also had increased shortness of breath. Denies sputum production, fever, chest pain, nausea, or vomiting. He has had some diarrhea and decreased appetite. He has been taking mucinex without improvement. He has been using his inhalers with some improvement. He is staying well hydrated.     ROS As per HPI.   Observations/Objective: Alert and oriented x 3. Able to speak in full sentences without difficulty.    Assessment and Plan: [name] was seen today for cough.  Diagnoses and all orders for this visit:  Chronic obstructive pulmonary disease with acute lower respiratory infection (HCC) Doxycycline as below. Hycodan ordered. Patient will  come by for Covid and flu testing. Quarantine until [employer]. Stay well hydrated, rest. Continue mucinex for congestion. Return to office for new or worsening symptoms, or if symptoms persist.  -     doxycycline (VIBRA-TABS) 100 MG tablet; Take 1 tablet (100 mg total) by mouth 2 (two) times daily for 10 days. 1 po bid -     HYDROcodone bit-homatropine (HYCODAN) 5-1.5 MG/5ML syrup; Take 5 mLs by mouth every 8 (eight) hours as needed for cough. -     Novel Coronavirus, NAA ([facility]); Future -     Veritor Flu A/B Waived -     Novel Coronavirus, NAA ([facility])     Follow Up Instructions: As needed.     I discussed the assessment and treatment plan with the patient. The patient was provided an opportunity to ask questions and all were answered. The patient agreed with the plan and demonstrated an understanding of the instructions.   The patient was advised to call back or seek an in-person evaluation if the symptoms worsen or if the condition fails to improve as anticipated.  The above assessment and management plan was discussed with the patient. The patient verbalized understanding of and has agreed to the management plan. Patient is aware to call the clinic if symptoms persist or worsen. Patient is aware when to return to the clinic for a follow-up visit. Patient educated on when it is appropriate to go to the emergency department.   Time call ended:  1105  I provided 11 minutes of  non face-to-face time during this encounter.    Gabriel Earing, FNP

## 2020-11-22 LAB — NOVEL CORONAVIRUS, NAA: SARS-CoV-2, NAA: NOT DETECTED

## 2020-11-22 LAB — SARS-COV-2, NAA 2 DAY TAT

## 2020-12-19 ENCOUNTER — Ambulatory Visit: Payer: BC Managed Care – PPO | Admitting: Family Medicine

## 2020-12-26 ENCOUNTER — Ambulatory Visit (INDEPENDENT_AMBULATORY_CARE_PROVIDER_SITE_OTHER): Payer: BC Managed Care – PPO | Admitting: Family Medicine

## 2020-12-26 DIAGNOSIS — W57XXXA Bitten or stung by nonvenomous insect and other nonvenomous arthropods, initial encounter: Secondary | ICD-10-CM | POA: Diagnosis not present

## 2020-12-26 DIAGNOSIS — S30861A Insect bite (nonvenomous) of abdominal wall, initial encounter: Secondary | ICD-10-CM | POA: Diagnosis not present

## 2020-12-26 MED ORDER — DOXYCYCLINE HYCLATE 100 MG PO TABS: 100.0000 mg | ORAL_TABLET | Freq: Two times a day (BID) | ORAL | 0 refills | Status: AC

## 2020-12-26 NOTE — Progress Notes (Signed)
Telephone visit  Subjective: CC: fatigue PCP: Gwenlyn Fudge, FNP WCB:[id number] [facility] is a 50 y.o. male calls for telephone consult today. Patient provides verbal consent for consult held via phone.  Due to COVID-19 pandemic this visit was conducted virtually. This visit type was conducted due to national recommendations for restrictions regarding the COVID-19 Pandemic (e.g. social distancing, sheltering in place) in an effort to limit this patient's exposure and mitigate transmission in our community. All issues noted in this document were discussed and addressed.  A physical exam was not performed with this format.   Location of patient: work Location of provider: [facility] Others present for call: none  1. Fatigue Patient reports that he was ill a few weeks ago and he notes that he has been bitten by several ticks recently.  Over the last week he has had no energy, appetite.  It is a struggle to get up in the am.  He reports right foot pain that is relieved by advil.  He reports redness at site of tick bites.  No nausea, vomiting, diarrhea.  Does not report any URI symptoms.   ROS: Per HPI  Allergies  Allergen Reactions   Penicillins Rash   Past Medical History:  Diagnosis Date   COPD (chronic obstructive pulmonary disease) (HCC)    ED (erectile dysfunction)    Gout    Hypertension    Pneumonia    Prediabetes 09/18/2020    Current Outpatient Medications:    sildenafil (VIAGRA) 100 MG tablet, TAKE 1 TABLET BY MOUTH ONCE DAILY AS NEEDED FOR ERECTILE DYSFUNCTION, Disp: 30 tablet, Rfl: 0   albuterol (VENTOLIN HFA) 108 (90 Base) MCG/ACT inhaler, Inhale 2 puffs into the lungs every 6 (six) hours as needed for wheezing or shortness of breath., Disp: 18 g, Rfl: 2   allopurinol (ZYLOPRIM) 100 MG tablet, Take 2 tablets (200 mg total) by mouth daily., Disp: 180 tablet, Rfl: 1   amLODipine (NORVASC) 10 MG tablet, Take 1 tablet (10 mg total) by mouth daily., Disp: 90 tablet, Rfl: 1    HYDROcodone bit-homatropine (HYCODAN) 5-1.5 MG/5ML syrup, Take 5 mLs by mouth every 8 (eight) hours as needed for cough., Disp: 120 mL, Rfl: 0   ibuprofen (ADVIL,MOTRIN) 800 MG tablet, Take 1 tablet (800 mg total) by mouth every 8 (eight) hours as needed., Disp: 21 tablet, Rfl: 0   lisinopril (ZESTRIL) 40 MG tablet, Take 1 tablet (40 mg total) by mouth daily., Disp: 90 tablet, Rfl: 1   naproxen (EC NAPROSYN) 500 MG EC tablet, Take 1 tablet (500 mg total) by mouth 2 (two) times daily with a meal., Disp: 180 tablet, Rfl: 1   pantoprazole (PROTONIX) 40 MG tablet, Take 1 tablet (40 mg total) by mouth daily before breakfast., Disp: 90 tablet, Rfl: 1  Assessment/ Plan: 50 y.o. male   Tick bite of abdominal wall, initial encounter - Plan: doxycycline (VIBRA-TABS) 100 MG tablet  Given reports of multiple tick bites and now new onset fatigue without any other concerning symptomology suggestive of viral infection, I am going to empirically treat him with doxycycline for the next 2 weeks.  I advised him to follow-up with [place] if symptoms worsen or do not resolve.  May need extension of doxycycline versus may need work-up for other possible etiologies.  Okay to continue Advil if needed for foot pain.  Uncertain if this is a gout flare versus arthralgia from infection.  Start time: 2:15pm End time: 2:20pm  Total time spent on patient care (including  telephone call/ virtual visit): 5 minutes  Lelan Cush Hulen Skains, DO [facility] [phone]

## 2020-12-26 NOTE — Patient Instructions (Signed)
Lyme Disease Lyme disease is an infection that can affect many parts of the body, including the skin, joints, and nervous system. It is a bacterial infection that starts from the bite of an infected tick. Over time, the infection can worsen, and some of the symptoms are similar to the flu. If Lyme disease is not treated, it may cause joint pain, swelling, numbness, problems thinking, fatigue, muscleweakness, and other problems. What are the causes? This condition is caused by bacteria called Borrelia burgdorferi. You can get Lyme disease by being bitten by an infected tick. Only black-legged, or Ixodes, ticks that are infected with the bacteria can cause Lyme disease. The tick must be attached to your skin for a certain period of time to pass along the infection. This is usually 36-48 hours. Deer often carry infected ticks. What increases the risk? The following factors may make you more likely to develop this condition: Living in or visiting these areas in the [place]. [place]. [place]. Spending time in wooded or grassy areas. Being outdoors with exposed skin. Camping, gardening, hiking, fishing, hunting, or working outdoors. Failing to remove a tick from your skin. What are the signs or symptoms? Symptoms of this condition may include: Chills and fever. Headache. Fatigue. General achiness. Muscle pain. Joint pain, often in the knees. A round, red rash that surrounds the center of the tick bite. The center of the rash may be blood colored or have tiny blisters. Swollen lymph glands. Stiff neck. How is this diagnosed? This condition is diagnosed based on: Your symptoms and medical history. A physical exam. A blood test. How is this treated? The main treatment for this condition is antibiotic medicine, which is usually taken by mouth (orally). The length of treatment depends on how soon after a tick bite you begin taking the medicine. In some  cases, treatment is necessary for several weeks. If the infection is severe, antibiotics may need to be given through an IV that is inserted into one of your veins. Follow these instructions at home: Take over-the-counter and prescription medicines only as told by your health care provider. Finish all antibiotic medicine, even when you start to feel better. Ask your health care provider about taking a probiotic in between doses of your antibiotic to help avoid an upset stomach or diarrhea. Check with your health care provider before supplementing your treatment. Many alternative therapies have not been proven and may be harmful to you. Keep all follow-up visits as told by your health care provider. This is important. How is this prevented? You can become reinfected if you get another tick bite from an infected tick. Take these steps to help prevent an infection: Cover your skin with light-colored clothing when you are outdoors in the spring and summer months. Spray clothing and skin with bug spray. The spray should be 20-30% DEET. You can also treat clothing with permethrin, and let it dry before you wear it. Do not apply permethrin directly to your skin. Permethrin can also be used to treat camping gear and boots. Always read and follow the instructions that come with a bug spray or insecticide. Avoid wooded, grassy, and shaded areas. Remove yard litter, brush, trash, and plants that attract deer and rodents. Check yourself for ticks when you come indoors. Wash clothing worn each day. Shower after spending time outdoors. Check your pets for ticks before they come inside. If you find a tick attached to your skin: Remove it with tweezers. Clean   your hands and the bite area with rubbing alcohol or soap and water. Dispose of the tick by putting it in rubbing alcohol, putting it in a sealed bag or container, or flushing it down the toilet. You may choose to save the tick in a sealed container if you  wish for it to be tested at a later time. Pregnant women should take special care to avoid tick bites because it ispossible that the infection may be passed along to the fetus. Contact a health care provider if: You have symptoms after treatment. You have removed a tick and want to bring it to your health care provider for testing. Get help right away if: You have an irregular heartbeat. You have chest pain. You have nerve pain. Your face feels numb. You develop the following: A stiff neck. A severe headache. Severe nausea and vomiting. Sensitivity to light. Summary Lyme disease is an infection that can affect many parts of the body, including the skin, joints, and nervous system. This condition is caused by bacteria called Borrelia burgdorferi. You can get Lyme disease by being bitten by an infected tick. The main treatment for this condition is antibiotic medicine. This information is not intended to replace advice given to you by your health care provider. Make sure you discuss any questions you have with your healthcare provider. Document Revised: 10/15/2018 Document Reviewed: 09/09/2018 Elsevier Patient Education  2022 Elsevier Inc.  

## 2021-01-02 ENCOUNTER — Telehealth: Payer: Self-pay | Admitting: Family Medicine

## 2021-01-02 ENCOUNTER — Encounter: Payer: Self-pay | Admitting: Family Medicine

## 2021-01-02 ENCOUNTER — Ambulatory Visit (INDEPENDENT_AMBULATORY_CARE_PROVIDER_SITE_OTHER): Payer: BC Managed Care – PPO | Admitting: Family Medicine

## 2021-01-02 DIAGNOSIS — M109 Gout, unspecified: Secondary | ICD-10-CM | POA: Diagnosis not present

## 2021-01-02 MED ORDER — COLCHICINE 0.6 MG PO TABS: ORAL_TABLET | ORAL | 1 refills | Status: DC

## 2021-01-02 MED ORDER — PREDNISONE 10 MG (21) PO TBPK
ORAL_TABLET | ORAL | 0 refills | Status: DC
Start: 2021-01-02 — End: 2021-03-19

## 2021-01-02 NOTE — Progress Notes (Signed)
   Virtual Visit  Note Due to COVID-19 pandemic this visit was conducted virtually. This visit type was conducted due to national recommendations for restrictions regarding the COVID-19 Pandemic (e.g. social distancing, sheltering in place) in an effort to limit this patient's exposure and mitigate transmission in our community. All issues noted in this document were discussed and addressed.  A physical exam was not performed with this format.  I connected with Chase Christensen on 01/02/21 at 1514 by telephone and verified that I am speaking with the correct person using two identifiers. Chase Christensen is currently located at home and no one is currently with him during the visit. The provider, Gabriel Earing, FNP is located in their office at time of visit.  I discussed the limitations, risks, security and privacy concerns of performing an evaluation and management service by telephone and the availability of in person appointments. I also discussed with the patient that there may be a patient responsible charge related to this service. The patient expressed understanding and agreed to proceed.  CC: gout  History and Present Illness:  HPI Chase Christensen reports a gout flare of his right ankle. He reports pain in his right ankle for about 1 week. The pain worsened about 4-5 days ago and become red, warm, and swollen. He has been taking doxycycline for possible lyme disease for about 1 week. He has 1 week left of this. He remembers that he last time he had to take doxycyline he had a bad gout flare. He takes allopurinol 200 mg daily for chronic gout. He denies fever, chills, or decreased ROM.   ROS As per HPI.   Observations/Objective: Return to office for new or worsening symptoms, or if symptoms persist.   Assessment and Plan: Chase Christensen was seen today for gout.  Diagnoses and all orders for this visit:  Acute gout of right ankle, unspecified cause Prednisone pack and colchicine as below. Continue  daily allopurinol. Return to office for new or worsening symptoms, or if symptoms persist.  -     predniSONE (STERAPRED UNI-PAK 21 TAB) 10 MG (21) TBPK tablet; Use as directed on back of pill pack -     colchicine 0.6 MG tablet; Take 2 pills at first sight of flare, repeat 1 pill 1 hour later, then daily for 4-5 days    Follow Up Instructions: Return to office for new or worsening symptoms, or if symptoms persist.     I discussed the assessment and treatment plan with the patient. The patient was provided an opportunity to ask questions and all were answered. The patient agreed with the plan and demonstrated an understanding of the instructions.   The patient was advised to call back or seek an in-person evaluation if the symptoms worsen or if the condition fails to improve as anticipated.  The above assessment and management plan was discussed with the patient. The patient verbalized understanding of and has agreed to the management plan. Patient is aware to call the clinic if symptoms persist or worsen. Patient is aware when to return to the clinic for a follow-up visit. Patient educated on when it is appropriate to go to the emergency department.   Time call ended:  1526  I provided 11 minutes of  non face-to-face time during this encounter.    Gabriel Earing, FNP

## 2021-01-02 NOTE — Telephone Encounter (Signed)
Per previous note Televisit appt made

## 2021-01-02 NOTE — Telephone Encounter (Signed)
  Incoming Patient Call  01/02/2021  What symptoms do you have? Gout  How long have you been sick? Two-three days  Have you been seen for this problem? In the past, takes a preventative. Was given medicine for something else and thinks that is what is making it flare up  If your provider decides to give you a prescription, which pharmacy would you like for it to be sent to? [facility]   Patient informed that this information will be sent to the clinical staff for review and that they should receive a follow up call.

## 2021-01-02 NOTE — Telephone Encounter (Signed)
Per office policy patient will need appointment  Please schedule with joyce or on call provider for gout

## 2021-03-19 ENCOUNTER — Encounter: Payer: Self-pay | Admitting: Family Medicine

## 2021-03-19 ENCOUNTER — Ambulatory Visit (INDEPENDENT_AMBULATORY_CARE_PROVIDER_SITE_OTHER): Payer: BC Managed Care – PPO | Admitting: Family Medicine

## 2021-03-19 ENCOUNTER — Other Ambulatory Visit: Payer: Self-pay

## 2021-03-19 VITALS — BP 113/73 | HR 76 | Temp 98.0°F | Ht 75.0 in | Wt 267.6 lb

## 2021-03-19 DIAGNOSIS — M1A09X0 Idiopathic chronic gout, multiple sites, without tophus (tophi): Secondary | ICD-10-CM | POA: Diagnosis not present

## 2021-03-19 DIAGNOSIS — R7303 Prediabetes: Secondary | ICD-10-CM

## 2021-03-19 DIAGNOSIS — Z1211 Encounter for screening for malignant neoplasm of colon: Secondary | ICD-10-CM

## 2021-03-19 DIAGNOSIS — J441 Chronic obstructive pulmonary disease with (acute) exacerbation: Secondary | ICD-10-CM | POA: Insufficient documentation

## 2021-03-19 DIAGNOSIS — N529 Male erectile dysfunction, unspecified: Secondary | ICD-10-CM

## 2021-03-19 DIAGNOSIS — K219 Gastro-esophageal reflux disease without esophagitis: Secondary | ICD-10-CM | POA: Diagnosis not present

## 2021-03-19 DIAGNOSIS — Z0001 Encounter for general adult medical examination with abnormal findings: Secondary | ICD-10-CM | POA: Diagnosis not present

## 2021-03-19 DIAGNOSIS — Z0000 Encounter for general adult medical examination without abnormal findings: Secondary | ICD-10-CM

## 2021-03-19 DIAGNOSIS — J9801 Acute bronchospasm: Secondary | ICD-10-CM

## 2021-03-19 DIAGNOSIS — Z720 Tobacco use: Secondary | ICD-10-CM

## 2021-03-19 DIAGNOSIS — J449 Chronic obstructive pulmonary disease, unspecified: Secondary | ICD-10-CM | POA: Insufficient documentation

## 2021-03-19 DIAGNOSIS — I10 Essential (primary) hypertension: Secondary | ICD-10-CM

## 2021-03-19 LAB — BAYER DCA HB A1C WAIVED: HB A1C (BAYER DCA - WAIVED): 5.5 % (ref 4.8–5.6)

## 2021-03-19 MED ORDER — COLCHICINE 0.6 MG PO TABS: ORAL_TABLET | ORAL | 1 refills | Status: DC

## 2021-03-19 MED ORDER — LISINOPRIL 40 MG PO TABS: 40.0000 mg | ORAL_TABLET | Freq: Every day | ORAL | 1 refills | Status: DC

## 2021-03-19 MED ORDER — PANTOPRAZOLE SODIUM 40 MG PO TBEC: 40.0000 mg | DELAYED_RELEASE_TABLET | Freq: Every day | ORAL | 1 refills | Status: DC

## 2021-03-19 MED ORDER — ALBUTEROL SULFATE HFA 108 (90 BASE) MCG/ACT IN AERS: 2.0000 | INHALATION_SPRAY | Freq: Four times a day (QID) | RESPIRATORY_TRACT | 2 refills | Status: DC | PRN

## 2021-03-19 MED ORDER — AMLODIPINE BESYLATE 10 MG PO TABS: 10.0000 mg | ORAL_TABLET | Freq: Every day | ORAL | 1 refills | Status: DC

## 2021-03-19 MED ORDER — ALLOPURINOL 100 MG PO TABS: 200.0000 mg | ORAL_TABLET | Freq: Every day | ORAL | 1 refills | Status: DC

## 2021-03-19 MED ORDER — SILDENAFIL CITRATE 100 MG PO TABS: 100.0000 mg | ORAL_TABLET | Freq: Every day | ORAL | 1 refills | Status: DC | PRN

## 2021-03-19 NOTE — Progress Notes (Signed)
Assessment & Plan:  1. Well adult exam Preventive health education provided. - CBC with Differential/Platelet - CMP14+EGFR - Lipid panel  2. Essential hypertension Well controlled on current regimen.  - amLODipine (NORVASC) 10 MG tablet; Take 1 tablet (10 mg total) by mouth daily.  Dispense: 90 tablet; Refill: 1 - lisinopril (ZESTRIL) 40 MG tablet; Take 1 tablet (40 mg total) by mouth daily.  Dispense: 90 tablet; Refill: 1 - CBC with Differential/Platelet - CMP14+EGFR - Lipid panel  3. Prediabetes Normal A1c today of 5.5. - Bayer DCA Hb A1c Waived  4. Idiopathic chronic gout of multiple sites without tophus Well controlled on current regimen.  - allopurinol (ZYLOPRIM) 100 MG tablet; Take 2 tablets (200 mg total) by mouth daily.  Dispense: 180 tablet; Refill: 1 - colchicine 0.6 MG tablet; Take 2 tablets (1.2 mg) by mouth at  onset of gout flare, then 1 tablet (0.6 mg) 1 hour later.  Dispense: 30 tablet; Refill: 1 - CMP14+EGFR  5. Gastroesophageal reflux disease without esophagitis Well controlled on current regimen.  - pantoprazole (PROTONIX) 40 MG tablet; Take 1 tablet (40 mg total) by mouth daily before breakfast.  Dispense: 90 tablet; Refill: 1 - CMP14+EGFR  6. Erectile dysfunction, unspecified erectile dysfunction type Well controlled on current regimen.  - sildenafil (VIAGRA) 100 MG tablet; Take 1 tablet (100 mg total) by mouth daily as needed for erectile dysfunction.  Dispense: 30 tablet; Refill: 1  7. Bronchospasm Well controlled on current regimen.  - albuterol (VENTOLIN HFA) 108 (90 Base) MCG/ACT inhaler; Inhale 2 puffs into the lungs every 6 (six) hours as needed for wheezing or shortness of breath.  Dispense: 18 g; Refill: 2  8. Tobacco use Not interested in smoking cessation.  9. Colon cancer screening - Cologuard   Follow-up: Return in about 6 months (around 09/16/2021) for follow-up of chronic medication conditions.   Chase Boston, MSN, APRN,  FNP-C [facility]  Subjective:  Patient ID: Chase Christensen, male    DOB: [birth year]  Age: 50 y.o. 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; Engineer, water, Marital status: Divorced, Substance use: None Diet: None, Exercise: None Last eye exam: June 2022 Last dental exam: "Been awhile" Last colonoscopy: Never Hepatitis C Screening: declined PSA: WNL 09/13/2020 Immunizations: Flu Vaccine: declined Tdap Vaccine: declined  COVID-19 Vaccine: up to date Pneumonia Vaccine: declined  DEPRESSION SCREENING PHQ 2/9 Scores 03/19/2021 09/13/2020 06/18/2020 01/19/2020 10/13/2019 03/08/2019 03/02/2018  PHQ - 2 Score 0 0 0 0 0 0 0  PHQ- 9 Score 0 - - - - - -     Review of Systems  Constitutional:  Negative for chills, fever, malaise/fatigue and weight loss.  HENT:  Negative for congestion, ear discharge, ear pain, nosebleeds, sinus pain, sore throat and tinnitus.   Eyes:  Negative for blurred vision, double vision, pain, discharge and redness.  Respiratory:  Negative for cough, shortness of breath and wheezing.   Cardiovascular:  Negative for chest pain, palpitations and leg swelling.  Gastrointestinal:  Negative for abdominal pain, constipation, diarrhea, heartburn, nausea and vomiting.  Genitourinary:  Negative for dysuria, frequency and urgency.  Musculoskeletal:  Negative for myalgias.  Skin:  Negative for rash.  Neurological:  Negative for dizziness, seizures, weakness and headaches.  Psychiatric/Behavioral:  Negative for depression, substance abuse and suicidal ideas. The patient is not nervous/anxious.     Current Outpatient Medications:    albuterol (VENTOLIN HFA) 108 (90 Base) MCG/ACT inhaler, Inhale 2 puffs into the lungs every 6 (six) hours as needed  for wheezing or shortness of breath., Disp: 18 g, Rfl: 2   allopurinol (ZYLOPRIM) 100 MG tablet, Take 2 tablets (200 mg total) by mouth daily., Disp: 180 tablet, Rfl: 1   amLODipine (NORVASC) 10 MG tablet, Take 1 tablet (10 mg total) by mouth daily., Disp: 90 tablet, Rfl: 1   ibuprofen (ADVIL,MOTRIN) 800 MG tablet, Take 1 tablet (800 mg total) by mouth every 8 (eight) hours as needed., Disp: 21 tablet, Rfl: 0   lisinopril (ZESTRIL) 40 MG tablet, Take 1 tablet (40 mg total) by mouth daily., Disp: 90 tablet, Rfl: 1   pantoprazole (PROTONIX) 40 MG tablet, Take 1 tablet (40 mg total) by mouth daily before breakfast., Disp: 90 tablet, Rfl: 1   sildenafil (VIAGRA) 100 MG tablet, TAKE 1 TABLET BY MOUTH ONCE DAILY AS NEEDED FOR ERECTILE DYSFUNCTION, Disp: 30 tablet, Rfl: 0  Allergies  Allergen Reactions   Penicillins Rash    Past Medical History:  Diagnosis Date   COPD (chronic obstructive pulmonary disease) (HCC)    ED (erectile dysfunction)    Gout    Hypertension    Pneumonia    Prediabetes 09/18/2020    Past Surgical History:  Procedure Laterality Date   KNEE ARTHROSCOPY Right 03/15/2015   WISDOM TOOTH EXTRACTION      Family History  Problem Relation Age of Onset   Hypertension Mother    Diabetes Maternal Grandmother    Lung cancer Maternal Grandfather    Pancreatic cancer Paternal Grandmother    Colon cancer Paternal Uncle    Colon polyps Cousin    Cancer Other        all paternal uncles and anunts passed with cancer    Social History   Socioeconomic History   Marital status: Legally Separated    Spouse name: Not on file   Number of children: 1   Years of education: Not on file   Highest education level: Not on file  Occupational History   Occupation: servic tech/ [employer]  Tobacco Use   Smoking status: Every Day    Packs/day: 0.25    Types: Cigarettes   Smokeless tobacco: Former    Types: Associate Professor Use: Never used  Substance and Sexual  Activity   Alcohol use: Yes    Comment: occas   Drug use: No   Sexual activity: Not on file  Other Topics Concern   Not on file  Social History Narrative   Not on file   Social Determinants of Health   Financial Resource Strain: Not on file  Food Insecurity: Not on file  Transportation Needs: Not on file  Physical Activity: Not on file  Stress: Not on file  Social Connections: Not on file  Intimate Partner Violence: Not on file      Objective:    BP 113/73   Pulse 76   Temp 98 F (36.7 C) (Temporal)   Ht 6\' 3"  (1.905 m)   Wt 267 lb 9.6 oz (121.4 kg)   SpO2 93%   BMI 33.45 kg/m   Wt Readings from Last 3 Encounters:  03/19/21 267 lb 9.6 oz (121.4 kg)  09/13/20 271 lb 6.4 oz (123.1 kg)  06/18/20 271 lb (122.9 kg)  Physical Exam Vitals reviewed.  Constitutional:      General: He is not in acute distress.    Appearance: Normal appearance. He is obese. He is not ill-appearing, toxic-appearing or diaphoretic.  HENT:     Head: Normocephalic and atraumatic.     Right Ear: Tympanic membrane, ear canal and external ear normal. There is no impacted cerumen.     Left Ear: Tympanic membrane, ear canal and external ear normal. There is no impacted cerumen.     Nose: Nose normal. No congestion or rhinorrhea.     Mouth/Throat:     Mouth: Mucous membranes are moist.     Pharynx: Oropharynx is clear. No oropharyngeal exudate or posterior oropharyngeal erythema.  Eyes:     General: No scleral icterus.       Right eye: No discharge.        Left eye: No discharge.     Conjunctiva/sclera: Conjunctivae normal.     Pupils: Pupils are equal, round, and reactive to light.  Neck:     Vascular: No carotid bruit.  Cardiovascular:     Rate and Rhythm: Normal rate and regular rhythm.     Heart sounds: Normal heart sounds. No murmur heard.   No friction rub. No gallop.  Pulmonary:     Effort: Pulmonary effort is normal. No respiratory distress.     Breath sounds: Normal breath  sounds. No stridor. No wheezing, rhonchi or rales.  Abdominal:     General: Abdomen is flat. Bowel sounds are normal. There is no distension.     Palpations: Abdomen is soft. There is no hepatomegaly, splenomegaly or mass.     Tenderness: There is no abdominal tenderness. There is no guarding or rebound.     Hernia: No hernia is present.  Musculoskeletal:        General: Normal range of motion.     Cervical back: Normal range of motion and neck supple. No rigidity. No muscular tenderness.     Right lower leg: No edema.     Left lower leg: No edema.  Lymphadenopathy:     Cervical: No cervical adenopathy.  Skin:    General: Skin is warm and dry.     Capillary Refill: Capillary refill takes less than 2 seconds.  Neurological:     General: No focal deficit present.     Mental Status: He is alert and oriented to person, place, and time. Mental status is at baseline.  Psychiatric:        Mood and Affect: Mood normal.        Behavior: Behavior normal.        Thought Content: Thought content normal.        Judgment: Judgment normal.    Lab Results  Component Value Date   TSH 2.280 07/21/2017   Lab Results  Component Value Date   WBC 10.7 06/18/2020   HGB 17.1 06/18/2020   HCT 48.1 06/18/2020   MCV 94 06/18/2020   PLT 241 06/18/2020   Lab Results  Component Value Date   NA 142 09/13/2020   K 4.5 09/13/2020   CO2 21 09/13/2020   GLUCOSE 134 (H) 09/13/2020   BUN 15 09/13/2020   CREATININE 1.09 09/13/2020   BILITOT 0.2 09/13/2020   ALKPHOS 103 09/13/2020   AST 29 09/13/2020   ALT 37 09/13/2020   PROT 6.0 09/13/2020   ALBUMIN 4.2 09/13/2020   CALCIUM 9.3 09/13/2020   ANIONGAP 7 03/07/2018   EGFR 84 09/13/2020  Lab Results  Component Value Date   CHOL 134 09/13/2020   Lab Results  Component Value Date   HDL 32 (L) 09/13/2020   Lab Results  Component Value Date   LDLCALC 80 09/13/2020   Lab Results  Component Value Date   TRIG 123 09/13/2020   Lab Results   Component Value Date   CHOLHDL 4.2 09/13/2020   Lab Results  Component Value Date   HGBA1C 6.3 09/17/2020

## 2021-03-20 LAB — CBC WITH DIFFERENTIAL/PLATELET
Basophils Absolute: 0.1 10*3/uL (ref 0.0–0.2)
Basos: 1 %
EOS (ABSOLUTE): 0.4 10*3/uL (ref 0.0–0.4)
Eos: 4 %
Hematocrit: 49.1 % (ref 37.5–51.0)
Hemoglobin: 17 g/dL (ref 13.0–17.7)
Immature Grans (Abs): 0.1 10*3/uL (ref 0.0–0.1)
Immature Granulocytes: 1 %
Lymphocytes Absolute: 2.3 10*3/uL (ref 0.7–3.1)
Lymphs: 22 %
MCH: 31.9 pg (ref 26.6–33.0)
MCHC: 34.6 g/dL (ref 31.5–35.7)
MCV: 92 fL (ref 79–97)
Monocytes Absolute: 0.7 10*3/uL (ref 0.1–0.9)
Monocytes: 7 %
Neutrophils Absolute: 6.9 10*3/uL (ref 1.4–7.0)
Neutrophils: 65 %
Platelets: 231 10*3/uL (ref 150–450)
RBC: 5.33 x10E6/uL (ref 4.14–5.80)
RDW: 11.8 % (ref 11.6–15.4)
WBC: 10.4 10*3/uL (ref 3.4–10.8)

## 2021-03-20 LAB — CMP14+EGFR
ALT: 32 IU/L (ref 0–44)
AST: 23 IU/L (ref 0–40)
Albumin/Globulin Ratio: 1.9 (ref 1.2–2.2)
Albumin: 4.2 g/dL (ref 4.0–5.0)
Alkaline Phosphatase: 95 IU/L (ref 44–121)
BUN/Creatinine Ratio: 16 (ref 9–20)
BUN: 18 mg/dL (ref 6–24)
Bilirubin Total: 0.5 mg/dL (ref 0.0–1.2)
CO2: 23 mmol/L (ref 20–29)
Calcium: 9.7 mg/dL (ref 8.7–10.2)
Chloride: 108 mmol/L — ABNORMAL HIGH (ref 96–106)
Creatinine, Ser: 1.14 mg/dL (ref 0.76–1.27)
Globulin, Total: 2.2 g/dL (ref 1.5–4.5)
Glucose: 140 mg/dL — ABNORMAL HIGH (ref 65–99)
Potassium: 4.6 mmol/L (ref 3.5–5.2)
Sodium: 144 mmol/L (ref 134–144)
Total Protein: 6.4 g/dL (ref 6.0–8.5)
eGFR: 79 mL/min/{1.73_m2} (ref >59)

## 2021-03-20 LAB — LIPID PANEL
Chol/HDL Ratio: 4.9 ratio (ref 0.0–5.0)
Cholesterol, Total: 151 mg/dL (ref 100–199)
HDL: 31 mg/dL — ABNORMAL LOW (ref >39)
LDL Chol Calc (NIH): 91 mg/dL (ref 0–99)
Triglycerides: 164 mg/dL — ABNORMAL HIGH (ref 0–149)
VLDL Cholesterol Cal: 29 mg/dL (ref 5–40)

## 2021-03-24 ENCOUNTER — Encounter: Payer: Self-pay | Admitting: Family Medicine

## 2021-04-24 ENCOUNTER — Other Ambulatory Visit: Payer: Self-pay | Admitting: *Deleted

## 2021-04-24 DIAGNOSIS — K219 Gastro-esophageal reflux disease without esophagitis: Secondary | ICD-10-CM

## 2021-04-24 DIAGNOSIS — M1A09X0 Idiopathic chronic gout, multiple sites, without tophus (tophi): Secondary | ICD-10-CM

## 2021-04-24 DIAGNOSIS — I10 Essential (primary) hypertension: Secondary | ICD-10-CM

## 2021-04-24 MED ORDER — AMLODIPINE BESYLATE 10 MG PO TABS: 10.0000 mg | ORAL_TABLET | Freq: Every day | ORAL | 1 refills | Status: DC

## 2021-04-24 MED ORDER — PANTOPRAZOLE SODIUM 40 MG PO TBEC: 40.0000 mg | DELAYED_RELEASE_TABLET | Freq: Every day | ORAL | 1 refills | Status: DC

## 2021-04-24 MED ORDER — LISINOPRIL 40 MG PO TABS: 40.0000 mg | ORAL_TABLET | Freq: Every day | ORAL | 1 refills | Status: DC

## 2021-04-24 MED ORDER — ALLOPURINOL 100 MG PO TABS: 200.0000 mg | ORAL_TABLET | Freq: Every day | ORAL | 1 refills | Status: DC

## 2021-08-02 ENCOUNTER — Encounter: Payer: Self-pay | Admitting: Family Medicine

## 2021-08-02 ENCOUNTER — Ambulatory Visit (INDEPENDENT_AMBULATORY_CARE_PROVIDER_SITE_OTHER): Payer: BC Managed Care – PPO | Admitting: Family Medicine

## 2021-08-02 ENCOUNTER — Ambulatory Visit (INDEPENDENT_AMBULATORY_CARE_PROVIDER_SITE_OTHER): Payer: BC Managed Care – PPO

## 2021-08-02 VITALS — BP 131/88 | HR 81 | Temp 97.6°F | Ht 75.0 in | Wt 267.8 lb

## 2021-08-02 DIAGNOSIS — R0689 Other abnormalities of breathing: Secondary | ICD-10-CM | POA: Diagnosis not present

## 2021-08-02 DIAGNOSIS — R0902 Hypoxemia: Secondary | ICD-10-CM

## 2021-08-02 DIAGNOSIS — J449 Chronic obstructive pulmonary disease, unspecified: Secondary | ICD-10-CM

## 2021-08-02 DIAGNOSIS — Z720 Tobacco use: Secondary | ICD-10-CM

## 2021-08-02 DIAGNOSIS — R079 Chest pain, unspecified: Secondary | ICD-10-CM | POA: Diagnosis not present

## 2021-08-02 DIAGNOSIS — R197 Diarrhea, unspecified: Secondary | ICD-10-CM | POA: Diagnosis not present

## 2021-08-02 DIAGNOSIS — R0989 Other specified symptoms and signs involving the circulatory and respiratory systems: Secondary | ICD-10-CM | POA: Diagnosis not present

## 2021-08-02 MED ORDER — SPIRIVA RESPIMAT 2.5 MCG/ACT IN AERS: 2.0000 | INHALATION_SPRAY | Freq: Every day | RESPIRATORY_TRACT | 2 refills | Status: DC

## 2021-08-02 NOTE — Progress Notes (Signed)
Assessment & Plan:  1. Hypoxemia - DG Chest 2 View  2. Abnormal breath sounds - DG Chest 2 View  3. Diarrhea, unspecified type Education provided on diarrhea. Encouraged to start taking a probiotic.  - Ova and parasite examination; Future - Stool culture; Future - Clostridium difficile EIA; Future   Follow up plan: Return if symptoms worsen or fail to improve.  [name], MSN, APRN, FNP-C [facility]  Subjective:   Patient ID: Chase CORBELLO, male    DOB: Apr 03, 1971, 51 y.o.   MRN: [id number]  HPI: Chase Christensen is a 51 y.o. male presenting on 08/02/2021 for oxygen (Patient states he took his O2 and it was 1. No SOB) and Diarrhea (X 1 month)  Patient reports he bought a pulse oximeter and when it arrived he noticed his oxygen saturation was only 89%. It did go up to 92%. He rechecked with two different pulse oximeters which were the same. He also has a pain on the right side of his back in the middle that has been present x3 weeks. 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 Denies shortness of breath. He does have a diagnosis of COPD in his chart.   He also reports he has had diarrhea for the past month, which he describes as sometimes liquid and sometimes soft. Denies any blood or mucus. Unrelated to foods or liquids (including coffee). No recent travel or illnesses. No home treatments. GERD controlled with protonix. He had COVID in October.    ROS: Negative unless specifically indicated above in HPI.   Relevant past medical history reviewed and updated as indicated.   Allergies and medications reviewed and updated.   Current Outpatient Medications:    albuterol (VENTOLIN HFA) 108 (90 Base) MCG/ACT inhaler, Inhale 2 puffs into the lungs every 6 (six) hours as needed for wheezing or shortness of breath., Disp: 18 g, Rfl: 2   allopurinol (ZYLOPRIM) 100 MG tablet, Take 2 tablets (200 mg total) by mouth daily., Disp: 180 tablet, Rfl: 1   amLODipine  (NORVASC) 10 MG tablet, Take 1 tablet (10 mg total) by mouth daily., Disp: 90 tablet, Rfl: 1   colchicine 0.6 MG tablet, Take 2 tablets (1.2 mg) by mouth at  onset of gout flare, then 1 tablet (0.6 mg) 1 hour later., Disp: 30 tablet, Rfl: 1   ibuprofen (ADVIL,MOTRIN) 800 MG tablet, Take 1 tablet (800 mg total) by mouth every 8 (eight) hours as needed., Disp: 21 tablet, Rfl: 0   lisinopril (ZESTRIL) 40 MG tablet, Take 1 tablet (40 mg total) by mouth daily., Disp: 90 tablet, Rfl: 1   pantoprazole (PROTONIX) 40 MG tablet, Take 1 tablet (40 mg total) by mouth daily before breakfast., Disp: 90 tablet, Rfl: 1   sildenafil (VIAGRA) 100 MG tablet, Take 1 tablet (100 mg total) by mouth daily as needed for erectile dysfunction., Disp: 30 tablet, Rfl: 1  Allergies  Allergen Reactions   Penicillins Rash    Objective:   BP 131/88    Pulse 81    Temp 97.6 F (36.4 C) (Temporal)    Ht 6\' 3"  (1.905 m)    Wt 267 lb 12.8 oz (121.5 kg)    SpO2 94%    BMI 33.47 kg/m    Physical Exam Vitals reviewed.  Constitutional:      General: He is not in acute distress.    Appearance: Normal appearance. He is not ill-appearing, toxic-appearing or diaphoretic.  HENT:  Head: Normocephalic and atraumatic.  Eyes:     General: No scleral icterus.       Right eye: No discharge.        Left eye: No discharge.     Conjunctiva/sclera: Conjunctivae normal.  Cardiovascular:     Rate and Rhythm: Normal rate and regular rhythm.     Heart sounds: Normal heart sounds. No murmur heard.   No friction rub. No gallop.  Pulmonary:     Effort: Pulmonary effort is normal. No respiratory distress.     Breath sounds: No stridor. Examination of the right-middle field reveals rhonchi. Rhonchi present. No wheezing or rales.     Comments: Normal breath sounds above and below area of rhonchi which is directly below area of pain. Abdominal:     General: Abdomen is flat. Bowel sounds are normal. There is no distension.     Palpations:  Abdomen is soft. There is no mass.     Tenderness: There is no abdominal tenderness. There is no guarding or rebound.     Hernia: No hernia is present.  Musculoskeletal:        General: Normal range of motion.     Cervical back: Normal range of motion.  Skin:    General: Skin is warm and dry.  Neurological:     Mental Status: He is alert and oriented to person, place, and time. Mental status is at baseline.  Psychiatric:        Mood and Affect: Mood normal.        Behavior: Behavior normal.        Thought Content: Thought content normal.        Judgment: Judgment normal.

## 2021-08-05 ENCOUNTER — Encounter: Payer: Self-pay | Admitting: Family Medicine

## 2021-08-05 ENCOUNTER — Telehealth: Payer: Self-pay | Admitting: Family Medicine

## 2021-08-06 ENCOUNTER — Ambulatory Visit (HOSPITAL_COMMUNITY): Admission: RE | Admit: 2021-08-06 | Payer: BC Managed Care – PPO | Source: Ambulatory Visit

## 2021-08-30 ENCOUNTER — Other Ambulatory Visit: Payer: Self-pay

## 2021-08-30 ENCOUNTER — Ambulatory Visit
Admission: EM | Admit: 2021-08-30 | Discharge: 2021-08-30 | Disposition: A | Discharge status: Home / Self Care | Payer: BC Managed Care – PPO | Attending: Family Medicine | Admitting: Family Medicine

## 2021-08-30 DIAGNOSIS — S0502XA Injury of conjunctiva and corneal abrasion without foreign body, left eye, initial encounter: Secondary | ICD-10-CM | POA: Diagnosis not present

## 2021-08-30 MED ORDER — ERYTHROMYCIN 5 MG/GM OP OINT
TOPICAL_OINTMENT | OPHTHALMIC | 0 refills | Status: DC
Start: 2021-08-30 — End: 2021-09-17
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
## 2021-09-17 ENCOUNTER — Encounter: Payer: Self-pay | Admitting: Family Medicine

## 2021-09-17 ENCOUNTER — Ambulatory Visit: Payer: BC Managed Care – PPO | Admitting: Family Medicine

## 2021-09-17 VITALS — BP 130/89 | HR 70 | Temp 98.2°F | Ht 75.0 in | Wt 272.0 lb

## 2021-09-17 DIAGNOSIS — N529 Male erectile dysfunction, unspecified: Secondary | ICD-10-CM

## 2021-09-17 DIAGNOSIS — Z114 Encounter for screening for human immunodeficiency virus [HIV]: Secondary | ICD-10-CM | POA: Diagnosis not present

## 2021-09-17 DIAGNOSIS — K219 Gastro-esophageal reflux disease without esophagitis: Secondary | ICD-10-CM | POA: Diagnosis not present

## 2021-09-17 DIAGNOSIS — J449 Chronic obstructive pulmonary disease, unspecified: Secondary | ICD-10-CM

## 2021-09-17 DIAGNOSIS — M1A09X0 Idiopathic chronic gout, multiple sites, without tophus (tophi): Secondary | ICD-10-CM

## 2021-09-17 DIAGNOSIS — I10 Essential (primary) hypertension: Secondary | ICD-10-CM

## 2021-09-17 DIAGNOSIS — Z87898 Personal history of other specified conditions: Secondary | ICD-10-CM | POA: Diagnosis not present

## 2021-09-17 DIAGNOSIS — Z1159 Encounter for screening for other viral diseases: Secondary | ICD-10-CM

## 2021-09-17 DIAGNOSIS — R7303 Prediabetes: Secondary | ICD-10-CM | POA: Diagnosis not present

## 2021-09-17 DIAGNOSIS — E119 Type 2 diabetes mellitus without complications: Secondary | ICD-10-CM | POA: Insufficient documentation

## 2021-09-17 DIAGNOSIS — E1165 Type 2 diabetes mellitus with hyperglycemia: Secondary | ICD-10-CM | POA: Insufficient documentation

## 2021-09-17 HISTORY — DX: Type 2 diabetes mellitus without complications: E11.9

## 2021-09-17 LAB — BAYER DCA HB A1C WAIVED: HB A1C (BAYER DCA - WAIVED): 7 % — ABNORMAL HIGH (ref 4.8–5.6)

## 2021-09-17 MED ORDER — COLCHICINE 0.6 MG PO TABS: ORAL_TABLET | ORAL | 1 refills | Status: DC

## 2021-09-17 MED ORDER — ALLOPURINOL 100 MG PO TABS: 200.0000 mg | ORAL_TABLET | Freq: Every day | ORAL | 1 refills | Status: DC

## 2021-09-17 MED ORDER — AMLODIPINE BESYLATE 10 MG PO TABS: 10.0000 mg | ORAL_TABLET | Freq: Every day | ORAL | 1 refills | Status: DC

## 2021-09-17 MED ORDER — ALBUTEROL SULFATE HFA 108 (90 BASE) MCG/ACT IN AERS: 2.0000 | INHALATION_SPRAY | Freq: Four times a day (QID) | RESPIRATORY_TRACT | 2 refills | Status: DC | PRN

## 2021-09-17 MED ORDER — PANTOPRAZOLE SODIUM 40 MG PO TBEC: 40.0000 mg | DELAYED_RELEASE_TABLET | Freq: Every day | ORAL | 1 refills | Status: DC

## 2021-09-17 MED ORDER — LISINOPRIL 40 MG PO TABS: 40.0000 mg | ORAL_TABLET | Freq: Every day | ORAL | 1 refills | Status: DC

## 2021-09-17 NOTE — Telephone Encounter (Signed)
Patient was in the office today and states the CT scan still needs to be scheduled at [facility]. He said the mistake was trying to schedule at [facility] on [address] and that it needs to be at there [facility] location.  ?

## 2021-09-17 NOTE — Progress Notes (Signed)
? ?Assessment & Plan:  ?1. Essential hypertension ?Well controlled on current regimen.  ?- amLODipine (NORVASC) 10 MG tablet; Take 1 tablet (10 mg total) by mouth daily.  Dispense: 90 tablet; Refill: 1 ?- lisinopril (ZESTRIL) 40 MG tablet; Take 1 tablet (40 mg total) by mouth daily.  Dispense: 90 tablet; Refill: 1 ?- CBC with Differential/Platelet ?- CMP14+EGFR ?- Lipid panel ? ?2. Idiopathic chronic gout of multiple sites without tophus ?Well controlled on current regimen.  ?- allopurinol (ZYLOPRIM) 100 MG tablet; Take 2 tablets (200 mg total) by mouth daily.  Dispense: 180 tablet; Refill: 1 ?- colchicine 0.6 MG tablet; Take 2 tablets (1.2 mg) by mouth at  onset of gout flare, then 1 tablet (0.6 mg) 1 hour later.  Dispense: 10 tablet; Refill: 1 ?- CMP14+EGFR ? ?3. History of prediabetes ?- Bayer DCA Hb A1c Waived ? ?4. Gastroesophageal reflux disease without esophagitis ?Well controlled on current regimen.  ?- pantoprazole (PROTONIX) 40 MG tablet; Take 1 tablet (40 mg total) by mouth daily before breakfast.  Dispense: 90 tablet; Refill: 1 ?- CMP14+EGFR ? ?5. COPD without exacerbation (HCC) ?I have requested CT scan be rescheduled for [facility] in [facility]. He will apply for the Spiriva copay card online and let me know if this does not lower the cost for him. Well controlled on current regimen.  ?- albuterol (VENTOLIN HFA) 108 (90 Base) MCG/ACT inhaler; Inhale 2 puffs into the lungs every 6 (six) hours as needed for wheezing or shortness of breath.  Dispense: 18 g; Refill: 2 ? ?6. Erectile dysfunction, unspecified erectile dysfunction type ?Well controlled on current regimen.  ? ?7. Encounter for hepatitis C screening test for low risk patient ?- Hepatitis C antibody (reflex, frozen specimen) ? ?8. Encounter for screening for HIV ?- HIV antibody (with reflex) ? ? ?Return in about 6 months (around 03/20/2022) for annual physical. ? ?Chase Boston, MSN, APRN, FNP-C ?[facility] ? ?Subjective:   ? ? Patient ID: Chase Christensen, male    DOB: Dec 17, 1970, 51 y.o.   MRN: [id number] ? ?Patient Care Team: ?Chase Fudge, FNP as PCP - General (Family Medicine)  ? ?Chief Complaint:  ?Chief Complaint  ?Patient presents with  ? Medical Management of Chronic Issues  ? ? ?HPI: ?Chase Christensen is a 51 y.o. male presenting on 09/17/2021 for Medical Management of Chronic Issues ? ?Gout: Patient has had no flares in over a year.  He is taking the allopurinol 200 mg once daily. He does have colchicine to take as needed. ? ?Hypertension: Patient reports his blood pressure is well controlled at home. ? ?GERD: taking pantoprazole. ? ?Erectile Dysfunction: uses sildenafil as needed. ? ?COPD: using Spiriva daily, but states it is expensive. He is needing Albuterol rarely. Previously a CT chest was ordered which has not yet been completed. He states this is because per insurance it needs to be scheduled at [facility] in [facility]. ? ?New complaints: ?None ? ? ?Social history: ? ?Relevant past medical, surgical, family and social history reviewed and updated as indicated. Interim medical history since our last visit reviewed. ? ?Allergies and medications reviewed and updated. ? ?DATA REVIEWED: CHART IN EPIC ? ?ROS: Negative unless specifically indicated above in HPI.  ? ? ?Current Outpatient Medications:  ?  albuterol (VENTOLIN HFA) 108 (90 Base) MCG/ACT inhaler, Inhale 2 puffs into the lungs every 6 (six) hours as needed for wheezing or shortness of breath., Disp: 18 g, Rfl: 2 ?  allopurinol (ZYLOPRIM) 100  MG tablet, Take 2 tablets (200 mg total) by mouth daily., Disp: 180 tablet, Rfl: 1 ?  amLODipine (NORVASC) 10 MG tablet, Take 1 tablet (10 mg total) by mouth daily., Disp: 90 tablet, Rfl: 1 ?  colchicine 0.6 MG tablet, Take 2 tablets (1.2 mg) by mouth at  onset of gout flare, then 1 tablet (0.6 mg) 1 hour later., Disp: 30 tablet, Rfl: 1 ?  ibuprofen (ADVIL,MOTRIN) 800 MG tablet, Take 1 tablet (800 mg total) by mouth every 8  (eight) hours as needed., Disp: 21 tablet, Rfl: 0 ?  lisinopril (ZESTRIL) 40 MG tablet, Take 1 tablet (40 mg total) by mouth daily., Disp: 90 tablet, Rfl: 1 ?  pantoprazole (PROTONIX) 40 MG tablet, Take 1 tablet (40 mg total) by mouth daily before breakfast., Disp: 90 tablet, Rfl: 1 ?  sildenafil (VIAGRA) 100 MG tablet, Take 1 tablet (100 mg total) by mouth daily as needed for erectile dysfunction., Disp: 30 tablet, Rfl: 1 ?  Tiotropium Bromide Monohydrate (SPIRIVA RESPIMAT) 2.5 MCG/ACT AERS, Inhale 2 puffs into the lungs daily., Disp: 4 g, Rfl: 2  ? ?Allergies  ?Allergen Reactions  ? Penicillins Rash  ? ?Past Medical History:  ?Diagnosis Date  ? COPD (chronic obstructive pulmonary disease) (HCC)   ? ED (erectile dysfunction)   ? Gout   ? Hypertension   ? Pneumonia   ? Prediabetes 09/18/2020  ?  ?Past Surgical History:  ?Procedure Laterality Date  ? KNEE ARTHROSCOPY Right 03/15/2015  ? WISDOM TOOTH EXTRACTION    ?  ?Social History  ? ?Socioeconomic History  ? Marital status: Divorced  ?  Spouse name: Not on file  ? Number of children: 1  ? Years of education: Not on file  ? Highest education level: Not on file  ?Occupational History  ? Occupation: servic tech/ [employer]  ? Occupation: Sports coach  ?Tobacco Use  ? Smoking status: Every Day  ?  Packs/day: 0.25  ?  Types: Cigarettes  ? Smokeless tobacco: Former  ?  Types: Chew  ?Vaping Use  ? Vaping Use: Never used  ?Substance and Sexual Activity  ? Alcohol use: Yes  ?  Comment: occas  ? Drug use: No  ? Sexual activity: Not on file  ?Other Topics Concern  ? Not on file  ?Social History Narrative  ? Not on file  ? ?Social Determinants of Health  ? ?Financial Resource Strain: Not on file  ?Food Insecurity: Not on file  ?Transportation Needs: Not on file  ?Physical Activity: Not on file  ?Stress: Not on file  ?Social Connections: Not on file  ?Intimate Partner Violence: Not on file  ?  ? ?   ?Objective:  ?  ?BP 130/89   Pulse 70   Temp 98.2 ?F (36.8 ?C)  (Temporal)   Ht 6\' 3"  (1.905 m)   Wt 272 lb (123.4 kg)   SpO2 94%   BMI 34.00 kg/m?  ? ?Wt Readings from Last 3 Encounters:  ?09/17/21 272 lb (123.4 kg)  ?08/02/21 267 lb 12.8 oz (121.5 kg)  ?03/19/21 267 lb 9.6 oz (121.4 kg)  ? ? ?Physical Exam ?Vitals reviewed.  ?Constitutional:   ?   General: He is not in acute distress. ?   Appearance: Normal appearance. He is obese. He is not ill-appearing, toxic-appearing or diaphoretic.  ?HENT:  ?   Head: Normocephalic and atraumatic.  ?Eyes:  ?   General: No scleral icterus.    ?   Right eye: No discharge.     ?  Left eye: No discharge.  ?   Conjunctiva/sclera: Conjunctivae normal.  ?Cardiovascular:  ?   Rate and Rhythm: Normal rate and regular rhythm.  ?   Heart sounds: Normal heart sounds. No murmur heard. ?  No friction rub. No gallop.  ?Pulmonary:  ?   Effort: Pulmonary effort is normal. No respiratory distress.  ?   Breath sounds: No stridor. Examination of the right-upper field reveals wheezing and rhonchi. Examination of the right-middle field reveals wheezing and rhonchi. Examination of the right-lower field reveals wheezing and rhonchi. Wheezing and rhonchi present. No rales.  ?Musculoskeletal:     ?   General: Normal range of motion.  ?   Cervical back: Normal range of motion.  ?Skin: ?   General: Skin is warm and dry.  ?Neurological:  ?   Mental Status: He is alert and oriented to person, place, and time. Mental status is at baseline.  ?Psychiatric:     ?   Mood and Affect: Mood normal.     ?   Behavior: Behavior normal.     ?   Thought Content: Thought content normal.     ?   Judgment: Judgment normal.  ? ? ?Lab Results  ?Component Value Date  ? TSH 2.280 07/21/2017  ? ?Lab Results  ?Component Value Date  ? WBC 10.4 03/19/2021  ? HGB 17.0 03/19/2021  ? HCT 49.1 03/19/2021  ? MCV 92 03/19/2021  ? PLT 231 03/19/2021  ? ?Lab Results  ?Component Value Date  ? NA 144 03/19/2021  ? K 4.6 03/19/2021  ? CO2 23 03/19/2021  ? GLUCOSE 140 (H) 03/19/2021  ? BUN 18  03/19/2021  ? CREATININE 1.14 03/19/2021  ? BILITOT 0.5 03/19/2021  ? ALKPHOS 95 03/19/2021  ? AST 23 03/19/2021  ? ALT 32 03/19/2021  ? PROT 6.4 03/19/2021  ? ALBUMIN 4.2 03/19/2021  ? CALCIUM 9.7 03/19/2021  ? ANIONGA

## 2021-09-17 NOTE — Patient Instructions (Addendum)
Please complete your Cologuard by the end of the month. ? ?[employer] Spiriva copay card for prescription assistance. ?

## 2021-09-17 NOTE — Addendum Note (Signed)
Addended by: Lorelee Cover C on: 09/17/2021 11:48 AM ? ? Modules accepted: Orders ? ?

## 2021-09-18 ENCOUNTER — Encounter: Payer: Self-pay | Admitting: Family Medicine

## 2021-09-18 LAB — CBC WITH DIFFERENTIAL/PLATELET
Basophils Absolute: 0.1 10*3/uL (ref 0.0–0.2)
Basos: 1 %
EOS (ABSOLUTE): 0.3 10*3/uL (ref 0.0–0.4)
Eos: 3 %
Hematocrit: 52.1 % — ABNORMAL HIGH (ref 37.5–51.0)
Hemoglobin: 18.1 g/dL — ABNORMAL HIGH (ref 13.0–17.7)
Immature Grans (Abs): 0 10*3/uL (ref 0.0–0.1)
Immature Granulocytes: 0 %
Lymphocytes Absolute: 1.8 10*3/uL (ref 0.7–3.1)
Lymphs: 19 %
MCH: 32 pg (ref 26.6–33.0)
MCHC: 34.7 g/dL (ref 31.5–35.7)
MCV: 92 fL (ref 79–97)
Monocytes Absolute: 0.6 10*3/uL (ref 0.1–0.9)
Monocytes: 6 %
Neutrophils Absolute: 6.6 10*3/uL (ref 1.4–7.0)
Neutrophils: 71 %
Platelets: 210 10*3/uL (ref 150–450)
RBC: 5.65 x10E6/uL (ref 4.14–5.80)
RDW: 12.4 % (ref 11.6–15.4)
WBC: 9.3 10*3/uL (ref 3.4–10.8)

## 2021-09-18 LAB — CMP14+EGFR
ALT: 40 IU/L (ref 0–44)
AST: 27 IU/L (ref 0–40)
Albumin/Globulin Ratio: 1.9 (ref 1.2–2.2)
Albumin: 4.5 g/dL (ref 4.0–5.0)
Alkaline Phosphatase: 116 IU/L (ref 44–121)
BUN/Creatinine Ratio: 15 (ref 9–20)
BUN: 14 mg/dL (ref 6–24)
Bilirubin Total: 0.3 mg/dL (ref 0.0–1.2)
CO2: 25 mmol/L (ref 20–29)
Calcium: 9.9 mg/dL (ref 8.7–10.2)
Chloride: 104 mmol/L (ref 96–106)
Creatinine, Ser: 0.96 mg/dL (ref 0.76–1.27)
Globulin, Total: 2.4 g/dL (ref 1.5–4.5)
Glucose: 161 mg/dL — ABNORMAL HIGH (ref 70–99)
Potassium: 4.9 mmol/L (ref 3.5–5.2)
Sodium: 140 mmol/L (ref 134–144)
Total Protein: 6.9 g/dL (ref 6.0–8.5)
eGFR: 97 mL/min/{1.73_m2} (ref >59)

## 2021-09-18 LAB — HIV ANTIBODY (ROUTINE TESTING W REFLEX): HIV Screen 4th Generation wRfx: NONREACTIVE

## 2021-09-18 LAB — HCV INTERPRETATION

## 2021-09-18 LAB — LIPID PANEL
Chol/HDL Ratio: 4.2 ratio (ref 0.0–5.0)
Cholesterol, Total: 154 mg/dL (ref 100–199)
HDL: 37 mg/dL — ABNORMAL LOW (ref >39)
LDL Chol Calc (NIH): 93 mg/dL (ref 0–99)
Triglycerides: 135 mg/dL (ref 0–149)
VLDL Cholesterol Cal: 24 mg/dL (ref 5–40)

## 2021-09-18 LAB — HCV AB W REFLEX TO QUANT PCR: HCV Ab: NONREACTIVE

## 2021-09-27 ENCOUNTER — Telehealth: Payer: Self-pay | Admitting: *Deleted

## 2021-09-27 NOTE — Telephone Encounter (Signed)
TC from [place] w/ [employer] ?RE: authorization for CT, this was the one placed back in January ?There was a mix up in the facility ?Just update the effective date and he can go to [facility] ?Auth # [id number] ?

## 2021-09-30 ENCOUNTER — Other Ambulatory Visit: Payer: Self-pay | Admitting: *Deleted

## 2021-09-30 DIAGNOSIS — J449 Chronic obstructive pulmonary disease, unspecified: Secondary | ICD-10-CM

## 2021-09-30 MED ORDER — SPIRIVA RESPIMAT 2.5 MCG/ACT IN AERS: 2.0000 | INHALATION_SPRAY | Freq: Every day | RESPIRATORY_TRACT | 1 refills | Status: DC

## 2021-10-16 ENCOUNTER — Other Ambulatory Visit: Payer: Self-pay | Admitting: Family Medicine

## 2021-10-16 DIAGNOSIS — Z720 Tobacco use: Secondary | ICD-10-CM

## 2021-10-16 DIAGNOSIS — R0689 Other abnormalities of breathing: Secondary | ICD-10-CM

## 2021-10-16 DIAGNOSIS — R0902 Hypoxemia: Secondary | ICD-10-CM

## 2021-10-20 ENCOUNTER — Other Ambulatory Visit: Payer: Self-pay | Admitting: Family Medicine

## 2021-10-20 DIAGNOSIS — K219 Gastro-esophageal reflux disease without esophagitis: Secondary | ICD-10-CM

## 2021-10-20 DIAGNOSIS — M1A09X0 Idiopathic chronic gout, multiple sites, without tophus (tophi): Secondary | ICD-10-CM

## 2021-10-20 DIAGNOSIS — I10 Essential (primary) hypertension: Secondary | ICD-10-CM

## 2021-10-22 ENCOUNTER — Ambulatory Visit (HOSPITAL_BASED_OUTPATIENT_CLINIC_OR_DEPARTMENT_OTHER): Payer: BC Managed Care – PPO

## 2021-10-23 ENCOUNTER — Other Ambulatory Visit (HOSPITAL_BASED_OUTPATIENT_CLINIC_OR_DEPARTMENT_OTHER): Payer: Self-pay | Admitting: Family Medicine

## 2021-10-23 ENCOUNTER — Ambulatory Visit (HOSPITAL_BASED_OUTPATIENT_CLINIC_OR_DEPARTMENT_OTHER)
Admission: RE | Admit: 2021-10-23 | Discharge: 2021-10-23 | Disposition: A | Discharge status: Home / Self Care | Payer: BC Managed Care – PPO | Source: Ambulatory Visit | Attending: Family Medicine | Admitting: Family Medicine

## 2021-10-23 DIAGNOSIS — R0689 Other abnormalities of breathing: Secondary | ICD-10-CM | POA: Diagnosis not present

## 2021-10-23 DIAGNOSIS — Z720 Tobacco use: Secondary | ICD-10-CM | POA: Diagnosis not present

## 2021-10-23 DIAGNOSIS — R0902 Hypoxemia: Secondary | ICD-10-CM | POA: Insufficient documentation

## 2021-10-23 DIAGNOSIS — J439 Emphysema, unspecified: Secondary | ICD-10-CM | POA: Diagnosis not present

## 2021-11-17 ENCOUNTER — Encounter: Payer: Self-pay | Admitting: Emergency Medicine

## 2021-11-17 ENCOUNTER — Ambulatory Visit (INDEPENDENT_AMBULATORY_CARE_PROVIDER_SITE_OTHER): Payer: BC Managed Care – PPO

## 2021-11-17 ENCOUNTER — Ambulatory Visit: Payer: Self-pay

## 2021-11-17 ENCOUNTER — Ambulatory Visit
Admission: EM | Admit: 2021-11-17 | Discharge: 2021-11-17 | Disposition: A | Discharge status: Home / Self Care | Payer: BC Managed Care – PPO | Attending: Internal Medicine | Admitting: Internal Medicine

## 2021-11-17 DIAGNOSIS — R059 Cough, unspecified: Secondary | ICD-10-CM

## 2021-11-17 DIAGNOSIS — J449 Chronic obstructive pulmonary disease, unspecified: Secondary | ICD-10-CM | POA: Diagnosis not present

## 2021-11-17 DIAGNOSIS — J441 Chronic obstructive pulmonary disease with (acute) exacerbation: Secondary | ICD-10-CM

## 2021-11-17 DIAGNOSIS — J019 Acute sinusitis, unspecified: Secondary | ICD-10-CM | POA: Diagnosis not present

## 2021-11-17 DIAGNOSIS — B9689 Other specified bacterial agents as the cause of diseases classified elsewhere: Secondary | ICD-10-CM | POA: Diagnosis not present

## 2021-11-17 MED ORDER — GUAIFENESIN ER 600 MG PO TB12
600.0000 mg | ORAL_TABLET | Freq: Two times a day (BID) | ORAL | 0 refills | Status: AC
Start: 2021-11-17 — End: 2021-11-27

## 2021-11-17 MED ORDER — HYDROXYZINE HCL 50 MG PO TABS: 50.0000 mg | ORAL_TABLET | Freq: Three times a day (TID) | ORAL | 0 refills | Status: DC | PRN

## 2021-11-17 MED ORDER — PREDNISONE 20 MG PO TABS: 20.0000 mg | ORAL_TABLET | Freq: Every day | ORAL | 0 refills | Status: AC

## 2021-11-17 MED ORDER — DOXYCYCLINE HYCLATE 100 MG PO CAPS: 100.0000 mg | ORAL_CAPSULE | Freq: Two times a day (BID) | ORAL | 0 refills | Status: AC

## 2021-11-17 MED ORDER — BENZONATATE 100 MG PO CAPS: 100.0000 mg | ORAL_CAPSULE | Freq: Three times a day (TID) | ORAL | 0 refills | Status: DC | PRN

## 2021-11-17 NOTE — ED Provider Notes (Signed)
?[facility] ? ? ? ?CSN: [id number] ?Arrival date & time: 11/17/21  1250 ? ? ?  ? ?History   ?Chief Complaint ?No chief complaint on file. ? ? ?HPI ?ELRIC GLAVIN is a 51 y.o. male with a history of COPD comes to urgent care with a 1 week history of nasal congestion, cough, chest tightness and wheezing.  Patient's symptoms started a week ago.  Initially he tried over-the-counter medications with no improvement in his symptoms.  Over the past couple of days he has been experiencing subjective fever, chills, greenish nasal discharge as well as greenish sputum.  He continues to have chest tightness and wheezing.  No nausea, vomiting or diarrhea.  He is also experiencing pressure over the sinuses and fullness in both ears. ? ?HPI ? ?Past Medical History:  ?Diagnosis Date  ? COPD (chronic obstructive pulmonary disease) ([place])   ? Diabetes ([place]) 09/17/2021  ? ED (erectile dysfunction)   ? Gout   ? Hypertension   ? Pneumonia   ? Prediabetes 09/18/2020  ? ? ?Patient Active Problem List  ? Diagnosis Date Noted  ? Diabetes ([place]) 09/17/2021  ? COPD without exacerbation ([place]) 03/19/2021  ? History of prediabetes 09/18/2020  ? 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 Gastroesophageal reflux disease 08/27/2017  ? Erectile dysfunction 06/08/2015  ? Essential hypertension 05/03/2015  ? Tobacco use 05/03/2015  ? ? ?Past Surgical History:  ?Procedure Laterality Date  ? KNEE ARTHROSCOPY Right 03/15/2015  ? WISDOM TOOTH EXTRACTION    ? ? ? ? ? ?Home Medications   ? ?Prior to Admission medications   ?Medication Sig Start Date End Date Taking? Authorizing Provider  ?benzonatate (TESSALON) 100 MG capsule Take 1 capsule (100 mg total) by mouth 3 (three) times daily as needed for cough. 11/17/21  Yes Brizeyda Holtmeyer, Myrene Galas, MD  ?doxycycline (VIBRAMYCIN) 100 MG capsule Take 1 capsule (100 mg total) by mouth 2 (two) times daily for 7 days. 11/17/21 11/24/21 Yes Kimbly Eanes, Myrene Galas, MD  ?guaiFENesin (MUCINEX) 600 MG 12 hr tablet  Take 1 tablet (600 mg total) by mouth 2 (two) times daily for 10 days. 11/17/21 11/27/21 Yes Anieya Helman, Myrene Galas, MD  ?hydrOXYzine (ATARAX) 50 MG tablet Take 1 tablet (50 mg total) by mouth every 8 (eight) hours as needed (insomnia). 11/17/21  Yes Zyanna Leisinger, Myrene Galas, MD  ?predniSONE (DELTASONE) 20 MG tablet Take 1 tablet (20 mg total) by mouth daily for 5 days. 11/17/21 11/22/21 Yes Anderson Middlebrooks, Myrene Galas, MD  ?albuterol (VENTOLIN HFA) 108 (90 Base) MCG/ACT inhaler Inhale 2 puffs into the lungs every 6 (six) hours as needed for wheezing or shortness of breath. 09/17/21   Hendricks Limes F, FNP  ?allopurinol (ZYLOPRIM) 100 MG tablet TAKE 2 TABLETS DAILY 10/21/21   Hendricks Limes F, FNP  ?amLODipine (NORVASC) 10 MG tablet TAKE 1 TABLET DAILY 10/21/21   Loman Brooklyn, FNP  ?colchicine 0.6 MG tablet Take 2 tablets (1.2 mg) by mouth at  onset of gout flare, then 1 tablet (0.6 mg) 1 hour later. 09/17/21   Loman Brooklyn, FNP  ?ibuprofen (ADVIL,MOTRIN) 800 MG tablet Take 1 tablet (800 mg total) by mouth every 8 (eight) hours as needed. 03/07/18   Hayden Rasmussen, MD  ?lisinopril (ZESTRIL) 40 MG tablet TAKE 1 TABLET DAILY 10/21/21   Loman Brooklyn, FNP  ?pantoprazole (PROTONIX) 40 MG tablet TAKE 1 TABLET DAILY BEFORE BREAKFAST 10/21/21   Loman Brooklyn, FNP  ?sildenafil (VIAGRA) 100 MG tablet Take 1 tablet (  100 mg total) by mouth daily as needed for erectile dysfunction. 03/19/21   Loman Brooklyn, FNP  ?Tiotropium Bromide Monohydrate (SPIRIVA RESPIMAT) 2.5 MCG/ACT AERS Inhale 2 puffs into the lungs daily. 09/30/21   Loman Brooklyn, FNP  ? ? ?Family History ?Family History  ?Problem Relation Age of Onset  ? Hypertension Mother   ? Diabetes Maternal Grandmother   ? Lung cancer Maternal Grandfather   ? Pancreatic cancer Paternal Grandmother   ? Colon cancer Paternal Uncle   ? Colon polyps Cousin   ? Cancer Other   ?     all paternal uncles and anunts passed with cancer  ? ? ?Social History ?Social History  ? ?Tobacco Use  ? Smoking  status: Every Day  ?  Packs/day: 0.25  ?  Types: Cigarettes  ? Smokeless tobacco: Former  ?  Types: Chew  ?Vaping Use  ? Vaping Use: Never used  ?Substance Use Topics  ? Alcohol use: Yes  ?  Comment: occas  ? Drug use: No  ? ? ? ?Allergies   ?Penicillins ? ? ?Review of Systems ?Review of Systems  ?Constitutional:  Positive for chills.  ?HENT:  Positive for postnasal drip.   ?Respiratory:  Positive for cough, chest tightness and wheezing. Negative for shortness of breath.   ?Cardiovascular:  Negative for chest pain and palpitations.  ?Gastrointestinal: Negative.   ?Skin: Negative.   ?Neurological:  Positive for headaches.  ? ? ?Physical Exam ?Triage Vital Signs ?ED Triage Vitals  ?Enc Vitals Group  ?   BP 11/17/21 1307 135/87  ?   Pulse Rate 11/17/21 1307 86  ?   Resp 11/17/21 1307 18  ?   Temp 11/17/21 1307 98.5 ?F (36.9 ?C)  ?   Temp Source 11/17/21 1307 Oral  ?   SpO2 11/17/21 1307 93 %  ?   Weight --   ?   Height --   ?   Head Circumference --   ?   Peak Flow --   ?   Pain Score 11/17/21 1306 5  ?   Pain Loc --   ?   Pain Edu? --   ?   Excl. in [place]? --   ? ?No data found. ? ?Updated Vital Signs ?BP 135/87 (BP Location: Right Arm)   Pulse 86   Temp 98.5 ?F (36.9 ?C) (Oral)   Resp 18   SpO2 93%  ? ?Visual Acuity ?Right Eye Distance:   ?Left Eye Distance:   ?Bilateral Distance:   ? ?Right Eye Near:   ?Left Eye Near:    ?Bilateral Near:    ? ?Physical Exam ?Vitals and nursing note reviewed.  ?Constitutional:   ?   Appearance: He is normal weight.  ?HENT:  ?   Right Ear: Tympanic membrane normal.  ?   Left Ear: Tympanic membrane normal.  ?Cardiovascular:  ?   Rate and Rhythm: Normal rate and regular rhythm.  ?   Pulses: Normal pulses.  ?   Heart sounds: Normal heart sounds.  ?Pulmonary:  ?   Effort: Pulmonary effort is normal. No respiratory distress.  ?   Breath sounds: Wheezing and rhonchi present.  ?Abdominal:  ?   General: Bowel sounds are normal.  ?Musculoskeletal:     ?   General: Normal range of motion.   ?Neurological:  ?   Mental Status: He is alert.  ? ? ? ?UC Treatments / Results  ?Labs ?(all labs ordered are listed, but only abnormal results are  displayed) ?Labs Reviewed - No data to display ? ?EKG ? ? ?Radiology ?DG Chest 2 View ? ?Result Date: 11/17/2021 ?CLINICAL DATA:  Cough x1 week, COPD EXAM: CHEST - 2 VIEW COMPARISON:  08/02/2021 FINDINGS: Cardiac size is within normal limits. Thoracic aorta is tortuous. Lung fields are clear of any infiltrates or pulmonary edema. There is slight prominence of interstitial markings in both lungs. Linear densities in both apices suggest scarring with no significant interval change. Blebs and bullae are seen in the apical regions of both lungs. There is no pleural effusion or pneumothorax. IMPRESSION: There are no signs of pulmonary edema or focal pulmonary consolidation. Slight prominence of interstitial markings in both lungs may suggest interstitial lung disease. Blebs and bullae are seen in the apices without significant interval change. Electronically Signed   By: Elmer Picker M.D.   On: 11/17/2021 14:34   ? ?Procedures ?Procedures (including critical care time) ? ?Medications Ordered in UC ?Medications - No data to display ? ?Initial Impression / Assessment and Plan / UC Course  ?I have reviewed the triage vital signs and the nursing notes. ? ?Pertinent labs & imaging results that were available during my care of the patient were reviewed by me and considered in my medical decision making (see chart for details). ? ?  ? ?1.  COPD with acute exacerbation: ?Prednisone 40 mg orally daily for 5 days ?Doxycycline 100 mg twice daily for 7 days ?Mucinex 600 mg twice daily for 10 days ?Tessalon Perles as needed for cough ?Chest x-ray is negative for pneumonia ?Maintain adequate hydration ? ?2.  Acute bacterial sinusitis: ?Management as above ?Return precautions given. ?Final Clinical Impressions(s) / UC Diagnoses  ? ?Final diagnoses:  ?Acute bacterial sinusitis  ?COPD  with acute exacerbation ([place])  ? ? ? ?Discharge Instructions   ? ?  ?Please take your medications as prescribed ?Chest x-ray is negative for pneumonia ?Increase oral fluid intake ?Return to urgent care if symptoms

## 2021-11-17 NOTE — Discharge Instructions (Addendum)
Please take your medications as prescribed ?Chest x-ray is negative for pneumonia ?Increase oral fluid intake ?Return to urgent care if symptoms worsen ? ?

## 2021-11-17 NOTE — ED Triage Notes (Signed)
Nasal congestion with green sputum x 1 week Productive cough with yellow sputum.  Has been taking over the counter medications without relief.  C/o of facial pressure and pressure in ears ?

## 2021-12-23 ENCOUNTER — Other Ambulatory Visit: Payer: Self-pay

## 2021-12-23 ENCOUNTER — Emergency Department (HOSPITAL_BASED_OUTPATIENT_CLINIC_OR_DEPARTMENT_OTHER)
Admission: EM | Admit: 2021-12-23 | Discharge: 2021-12-24 | Disposition: A | Discharge status: Home / Self Care | Payer: BC Managed Care – PPO | Attending: Emergency Medicine | Admitting: Emergency Medicine

## 2021-12-23 ENCOUNTER — Encounter (HOSPITAL_BASED_OUTPATIENT_CLINIC_OR_DEPARTMENT_OTHER): Payer: Self-pay

## 2021-12-23 DIAGNOSIS — Z79899 Other long term (current) drug therapy: Secondary | ICD-10-CM | POA: Diagnosis not present

## 2021-12-23 DIAGNOSIS — J449 Chronic obstructive pulmonary disease, unspecified: Secondary | ICD-10-CM | POA: Diagnosis not present

## 2021-12-23 DIAGNOSIS — I10 Essential (primary) hypertension: Secondary | ICD-10-CM | POA: Diagnosis not present

## 2021-12-23 DIAGNOSIS — D72829 Elevated white blood cell count, unspecified: Secondary | ICD-10-CM | POA: Insufficient documentation

## 2021-12-23 DIAGNOSIS — R739 Hyperglycemia, unspecified: Secondary | ICD-10-CM | POA: Insufficient documentation

## 2021-12-23 DIAGNOSIS — E1165 Type 2 diabetes mellitus with hyperglycemia: Secondary | ICD-10-CM | POA: Diagnosis not present

## 2021-12-23 LAB — CBC
HCT: 45.9 % (ref 39.0–52.0)
Hemoglobin: 16.3 g/dL (ref 13.0–17.0)
MCH: 32.1 pg (ref 26.0–34.0)
MCHC: 35.5 g/dL (ref 30.0–36.0)
MCV: 90.5 fL (ref 80.0–100.0)
Platelets: 216 10*3/uL (ref 150–400)
RBC: 5.07 MIL/uL (ref 4.22–5.81)
RDW: 11.9 % (ref 11.5–15.5)
WBC: 11.3 10*3/uL — ABNORMAL HIGH (ref 4.0–10.5)
nRBC: 0 % (ref 0.0–0.2)

## 2021-12-23 LAB — URINALYSIS, ROUTINE W REFLEX MICROSCOPIC
Bilirubin Urine: NEGATIVE
Glucose, UA: >1000 mg/dL — AB
Hgb urine dipstick: NEGATIVE
Ketones, ur: NEGATIVE mg/dL
Leukocytes,Ua: NEGATIVE
Nitrite: NEGATIVE
Protein, ur: NEGATIVE mg/dL
Specific Gravity, Urine: 1.033 — ABNORMAL HIGH (ref 1.005–1.030)
pH: 5 (ref 5.0–8.0)

## 2021-12-23 LAB — CBG MONITORING, ED: Glucose-Capillary: 594 mg/dL (ref 70–99)

## 2021-12-23 NOTE — ED Notes (Signed)
Ranette, RN notified of pt's blood sugar 594.

## 2021-12-23 NOTE — ED Triage Notes (Addendum)
Pt states he was feeling lightheaded, fatigued urinary frequency and thirsty BS on arrival 594

## 2021-12-24 LAB — CBG MONITORING, ED
Glucose-Capillary: 440 mg/dL — ABNORMAL HIGH (ref 70–99)
Glucose-Capillary: 331 mg/dL — ABNORMAL HIGH (ref 70–99)

## 2021-12-24 LAB — BASIC METABOLIC PANEL
Anion gap: 9 (ref 5–15)
BUN: 19 mg/dL (ref 6–20)
CO2: 25 mmol/L (ref 22–32)
Calcium: 9.9 mg/dL (ref 8.9–10.3)
Chloride: 99 mmol/L (ref 98–111)
Creatinine, Ser: 1.24 mg/dL (ref 0.61–1.24)
GFR, Estimated: >60 mL/min (ref >60)
Glucose, Bld: 636 mg/dL (ref 70–99)
Potassium: 4 mmol/L (ref 3.5–5.1)
Sodium: 133 mmol/L — ABNORMAL LOW (ref 135–145)

## 2021-12-24 LAB — I-STAT VENOUS BLOOD GAS, ED
Acid-base deficit: 2 mmol/L (ref 0.0–2.0)
Bicarbonate: 23.5 mmol/L (ref 20.0–28.0)
Calcium, Ion: 1.29 mmol/L (ref 1.15–1.40)
HCT: 43 % (ref 39.0–52.0)
Hemoglobin: 14.6 g/dL (ref 13.0–17.0)
O2 Saturation: 85 %
Potassium: 3.8 mmol/L (ref 3.5–5.1)
Sodium: 134 mmol/L — ABNORMAL LOW (ref 135–145)
TCO2: 25 mmol/L (ref 22–32)
pCO2, Ven: 40.5 mmHg — ABNORMAL LOW (ref 44–60)
pH, Ven: 7.372 (ref 7.25–7.43)
pO2, Ven: 51 mmHg — ABNORMAL HIGH (ref 32–45)

## 2021-12-24 MED ORDER — METFORMIN HCL 500 MG PO TABS: 500.0000 mg | ORAL_TABLET | Freq: Two times a day (BID) | ORAL | 0 refills | Status: DC

## 2021-12-24 MED ORDER — METFORMIN HCL 500 MG PO TABS
500.0000 mg | ORAL_TABLET | Freq: Once | ORAL | Status: AC
  Administered 2021-12-24: 500 mg via ORAL
  Filled 2021-12-24: qty 1

## 2021-12-24 MED ORDER — LACTATED RINGERS IV BOLUS
1000.0000 mL | Freq: Once | INTRAVENOUS | Status: AC
  Administered 2021-12-24: 1000 mL via INTRAVENOUS

## 2021-12-24 MED ORDER — INSULIN ASPART 100 UNIT/ML IV SOLN
5.0000 [IU] | Freq: Once | INTRAVENOUS | Status: AC
  Administered 2021-12-24: 5 [IU] via INTRAVENOUS

## 2021-12-24 NOTE — ED Notes (Signed)
Pt verbalizes understanding of discharge instructions. Opportunity for questioning and answers were provided. Pt discharged from ED to home with family.    

## 2021-12-24 NOTE — ED Provider Notes (Signed)
[facility] EMERGENCY DEPT Provider Note   CSN: [id number] Arrival date & time: 12/23/21  2303     History  Chief Complaint  Patient presents with   Hyperglycemia    Chase Christensen is a 51 y.o. male.  The history is provided by the patient.  Hyperglycemia He has history of hypertension, prediabetes, COPD and comes in because his sugar was high at home.  He states over the last week he has had urinary frequency and excessive thirst.  He checked his sugar at night and in the on 1 occasion was about 450 and when he repeated it was about 530.  It had been 2 weeks since the previous time he had checked his sugar.  He has had some mild nausea but no vomiting.  He denies any fever or chills.  He denies any blurred vision.  He knows that his A1c was high and thinks it was about 6, but he is not on any medication for his blood sugar.   Home Medications Prior to Admission medications   Medication Sig Start Date End Date Taking? Authorizing Provider  albuterol (VENTOLIN HFA) 108 (90 Base) MCG/ACT inhaler Inhale 2 puffs into the lungs every 6 (six) hours as needed for wheezing or shortness of breath. 09/17/21   [name] F, FNP  allopurinol (ZYLOPRIM) 100 MG tablet TAKE 2 TABLETS DAILY 10/21/21   Gwenlyn Fudge, FNP  amLODipine (NORVASC) 10 MG tablet TAKE 1 TABLET DAILY 10/21/21   Gwenlyn Fudge, FNP  benzonatate (TESSALON) 100 MG capsule Take 1 capsule (100 mg total) by mouth 3 (three) times daily as needed for cough. 11/17/21   Lamptey, Britta Mccreedy, MD  colchicine 0.6 MG tablet Take 2 tablets (1.2 mg) by mouth at  onset of gout flare, then 1 tablet (0.6 mg) 1 hour later. 09/17/21   Gwenlyn Fudge, FNP  hydrOXYzine (ATARAX) 50 MG tablet Take 1 tablet (50 mg total) by mouth every 8 (eight) hours as needed (insomnia). 11/17/21   Lamptey, Britta Mccreedy, MD  ibuprofen (ADVIL,MOTRIN) 800 MG tablet Take 1 tablet (800 mg total) by mouth every 8 (eight) hours as needed. 03/07/18   Terrilee Files, MD  lisinopril (ZESTRIL) 40 MG tablet TAKE 1 TABLET DAILY 10/21/21   Gwenlyn Fudge, FNP  pantoprazole (PROTONIX) 40 MG tablet TAKE 1 TABLET DAILY BEFORE BREAKFAST 10/21/21   [name] F, FNP  sildenafil (VIAGRA) 100 MG tablet Take 1 tablet (100 mg total) by mouth daily as needed for erectile dysfunction. 03/19/21   Gwenlyn Fudge, FNP  Tiotropium Bromide Monohydrate (SPIRIVA RESPIMAT) 2.5 MCG/ACT AERS Inhale 2 puffs into the lungs daily. 09/30/21   Gwenlyn Fudge, FNP      Allergies    Penicillins    Review of Systems   Review of Systems  All other systems reviewed and are negative.   Physical Exam Updated Vital Signs BP 114/77   Pulse 72   Temp 97.9 F (36.6 C)   Resp 18   Ht 6\' 3"  (1.905 m)   Wt 117.9 kg   SpO2 95%   BMI 32.50 kg/m  Physical Exam Vitals and nursing note reviewed.   51 year old male, resting comfortably and in no acute distress. Vital signs are normal. Oxygen saturation is 95%, which is normal. Head is normocephalic and atraumatic. PERRLA, EOMI. Oropharynx is clear. Neck is nontender and supple without adenopathy or JVD. Back is nontender and there is no CVA tenderness. Lungs are clear  without rales, wheezes, or rhonchi. Chest is nontender. Heart has regular rate and rhythm without murmur. Abdomen is soft, flat, nontender. Extremities have no cyanosis or edema, full range of motion is present. Skin is warm and dry without rash. Neurologic: Mental status is normal, cranial nerves are intact, moves all extremities equally.  ED Results / Procedures / Treatments   Labs (all labs ordered are listed, but only abnormal results are displayed) Labs Reviewed  BASIC METABOLIC PANEL - Abnormal; Notable for the following components:      Result Value   Sodium 133 (*)    Glucose, Bld 636 (*)    All other components within normal limits  CBC - Abnormal; Notable for the following components:   WBC 11.3 (*)    All other components within normal limits   URINALYSIS, ROUTINE W REFLEX MICROSCOPIC - Abnormal; Notable for the following components:   Color, Urine COLORLESS (*)    Specific Gravity, Urine 1.033 (*)    Glucose, UA >1,000 (*)    All other components within normal limits  CBG MONITORING, ED - Abnormal; Notable for the following components:   Glucose-Capillary 594 (*)    All other components within normal limits  CBG MONITORING, ED   Procedures Procedures    Medications Ordered in ED Medications  lactated ringers bolus 1,000 mL (has no administration in time range)  insulin aspart (novoLOG) injection 5 Units (has no administration in time range)  metFORMIN (GLUCOPHAGE) tablet 500 mg (has no administration in time range)    ED Course/ Medical Decision Making/ A&P                           Medical Decision Making Amount and/or Complexity of Data Reviewed Labs: ordered.  Risk OTC drugs. Prescription drug management.   Hyperglycemia, apparently new onset of diabetes.  I have reviewed and interpreted all of the laboratory tests today.  My interpretation is hyperglycemia without evidence of ketoacidosis.  Glucose is 636 but CO2 is normal at 25 and anion gap is normal at 9.  I have ordered a venous blood gas to make sure that he does not have occult acidosis.  Mild hyponatremia is present and felt to be secondary to hyperglycemia and not true hyponatremia.  Mild leukocytosis is present as a nonspecific stress reaction.  Urinalysis has a large amount of glucose with no ketones.  Specific gravity is elevated secondary to glycosuria.  Old records are reviewed, and at an office visit on 09/17/2021 hemoglobin A1c was significantly elevated at 7.0 but glucose level was only mildly elevated at 161.  He is given IV fluids and insulin as well as oral metformin to bring his glucose down to a safe level.  He states that he does have a follow-up appointment with his primary care provider in 1 week.  Blood glucoses come down to 331 with above  noted treatment, he is felt to be safe for discharge at this point.  He is discharged with prescription for metformin and is to follow-up with his PCP next week, as scheduled.  In the meantime, he is to continue to monitor his blood glucose at home.  CRITICAL CARE Performed by: Dione Booze Total critical care time: 55 minutes Critical care time was exclusive of separately billable procedures and treating other patients. Critical care was necessary to treat or prevent imminent or life-threatening deterioration. Critical care was time spent personally by me on the following activities: development of treatment  plan with patient and/or surrogate as well as nursing, discussions with consultants, evaluation of patient's response to treatment, examination of patient, obtaining history from patient or surrogate, ordering and performing treatments and interventions, ordering and review of laboratory studies, ordering and review of radiographic studies, pulse oximetry and re-evaluation of patient's condition.  Final Clinical Impression(s) / ED Diagnoses Final diagnoses:  Hyperglycemia    Rx / DC Orders ED Discharge Orders     None         Dione Booze, MD 12/24/21 [phone]

## 2021-12-24 NOTE — ED Notes (Signed)
CBG 440. RN aware.

## 2021-12-24 NOTE — Discharge Instructions (Addendum)
You have been given a prescription for metformin.  This is to try to lower your blood sugar.  It is very likely that she will need more medication than what has been prescribed today.  Please continue to check your blood sugar twice a day, and see your primary care provider in 1 week to assess response to the treatment.  Your primary care provider will adjust your medications as needed.

## 2021-12-27 ENCOUNTER — Ambulatory Visit: Payer: BC Managed Care – PPO | Admitting: Family Medicine

## 2021-12-27 ENCOUNTER — Encounter: Payer: Self-pay | Admitting: Family Medicine

## 2021-12-27 VITALS — BP 114/75 | HR 89 | Temp 96.9°F | Resp 20 | Ht 75.0 in | Wt 257.0 lb

## 2021-12-27 DIAGNOSIS — E1169 Type 2 diabetes mellitus with other specified complication: Secondary | ICD-10-CM | POA: Diagnosis not present

## 2021-12-27 LAB — BAYER DCA HB A1C WAIVED: HB A1C (BAYER DCA - WAIVED): 9.3 % — ABNORMAL HIGH (ref 4.8–5.6)

## 2021-12-28 LAB — BMP8+EGFR
BUN/Creatinine Ratio: 16 (ref 9–20)
BUN: 17 mg/dL (ref 6–24)
CO2: 20 mmol/L (ref 20–29)
Calcium: 9.8 mg/dL (ref 8.7–10.2)
Chloride: 107 mmol/L — ABNORMAL HIGH (ref 96–106)
Creatinine, Ser: 1.08 mg/dL (ref 0.76–1.27)
Glucose: 290 mg/dL — ABNORMAL HIGH (ref 70–99)
Potassium: 4.5 mmol/L (ref 3.5–5.2)
Sodium: 145 mmol/L — ABNORMAL HIGH (ref 134–144)
eGFR: 84 mL/min/{1.73_m2} (ref >59)

## 2021-12-31 ENCOUNTER — Ambulatory Visit: Payer: BC Managed Care – PPO | Admitting: Family Medicine

## 2022-02-25 ENCOUNTER — Other Ambulatory Visit: Payer: Self-pay | Admitting: Family Medicine

## 2022-03-20 ENCOUNTER — Other Ambulatory Visit: Payer: Self-pay | Admitting: Family Medicine

## 2022-03-26 ENCOUNTER — Encounter: Payer: Self-pay | Admitting: Family Medicine

## 2022-03-26 ENCOUNTER — Ambulatory Visit (INDEPENDENT_AMBULATORY_CARE_PROVIDER_SITE_OTHER): Payer: BC Managed Care – PPO | Admitting: Family Medicine

## 2022-03-26 VITALS — BP 107/72 | HR 68 | Temp 97.8°F | Resp 20 | Ht 75.0 in | Wt 255.0 lb

## 2022-03-26 DIAGNOSIS — J449 Chronic obstructive pulmonary disease, unspecified: Secondary | ICD-10-CM

## 2022-03-26 DIAGNOSIS — M109 Gout, unspecified: Secondary | ICD-10-CM

## 2022-03-26 DIAGNOSIS — Z0001 Encounter for general adult medical examination with abnormal findings: Secondary | ICD-10-CM

## 2022-03-26 DIAGNOSIS — R918 Other nonspecific abnormal finding of lung field: Secondary | ICD-10-CM

## 2022-03-26 DIAGNOSIS — E1165 Type 2 diabetes mellitus with hyperglycemia: Secondary | ICD-10-CM | POA: Diagnosis not present

## 2022-03-26 DIAGNOSIS — I10 Essential (primary) hypertension: Secondary | ICD-10-CM | POA: Diagnosis not present

## 2022-03-26 DIAGNOSIS — Z0000 Encounter for general adult medical examination without abnormal findings: Secondary | ICD-10-CM

## 2022-03-26 DIAGNOSIS — E1169 Type 2 diabetes mellitus with other specified complication: Secondary | ICD-10-CM | POA: Diagnosis not present

## 2022-03-26 DIAGNOSIS — K219 Gastro-esophageal reflux disease without esophagitis: Secondary | ICD-10-CM

## 2022-03-26 DIAGNOSIS — N529 Male erectile dysfunction, unspecified: Secondary | ICD-10-CM

## 2022-03-26 LAB — BAYER DCA HB A1C WAIVED: HB A1C (BAYER DCA - WAIVED): 6.7 % — ABNORMAL HIGH (ref 4.8–5.6)

## 2022-03-26 MED ORDER — ALLOPURINOL 100 MG PO TABS: 200.0000 mg | ORAL_TABLET | Freq: Every day | ORAL | 1 refills | Status: DC

## 2022-03-26 MED ORDER — AMLODIPINE BESYLATE 10 MG PO TABS: 10.0000 mg | ORAL_TABLET | Freq: Every day | ORAL | 1 refills | Status: DC

## 2022-03-26 MED ORDER — SPIRIVA RESPIMAT 2.5 MCG/ACT IN AERS: 2.0000 | INHALATION_SPRAY | Freq: Every day | RESPIRATORY_TRACT | 1 refills | Status: DC

## 2022-03-26 MED ORDER — PANTOPRAZOLE SODIUM 40 MG PO TBEC: 40.0000 mg | DELAYED_RELEASE_TABLET | Freq: Every day | ORAL | 1 refills | Status: DC

## 2022-03-26 MED ORDER — ROSUVASTATIN CALCIUM 5 MG PO TABS: 5.0000 mg | ORAL_TABLET | Freq: Every day | ORAL | 1 refills | Status: DC

## 2022-03-26 MED ORDER — LISINOPRIL 40 MG PO TABS: 40.0000 mg | ORAL_TABLET | Freq: Every day | ORAL | 1 refills | Status: DC

## 2022-03-26 MED ORDER — METFORMIN HCL ER 500 MG PO TB24: 500.0000 mg | ORAL_TABLET | Freq: Two times a day (BID) | ORAL | 1 refills | Status: DC

## 2022-03-26 NOTE — Progress Notes (Signed)
8. Abnormal CT scan of lung - Ambulatory referral to Pulmonology   Follow-up: Return in about 6 months (around 09/24/2022) for follow-up of chronic medication conditions with M. Rakes.   Hendricks Limes, MSN, APRN, FNP-C [facility]  Subjective:  Patient ID: Chase Christensen,  male    DOB: [birth year]  Age: 51 y.o. MRN: [id number]  Patient Care Team: Loman Brooklyn, FNP as PCP - General (Family Medicine)   CC:  Chief Complaint  Patient presents with   Annual Exam    HPI Chase Christensen is a 51 y.o. male who presents today for a complete physical exam. He reports consuming a  healthy/diabetic  diet. The patient does not participate in regular exercise at present. He generally feels well. He reports sleeping well. He does not have additional problems to discuss today.   Vision:Not within last year Dental:Receives regular dental care NIO:EVOJJKKX cancer screening and PSA options (with potential risks and benefits of testing vs. not testing) were discussed along with recent recs/guidelines.   Lab Results  Component Value Date   PSA1 1.6 09/13/2020   PSA1 1.5 10/13/2019   PSA1 1.5 07/21/2017   Lung Cancer Screening with low-dose Chest CT: 10/25/2021  DEPRESSION SCREENING    03/26/2022    9:22 AM 12/27/2021    8:42 AM 09/17/2021    8:10 AM 08/02/2021    9:04 AM 03/19/2021    9:19 AM 09/13/2020    8:05 AM 06/18/2020    2:43 PM  PHQ 2/9 Scores  PHQ - 2 Score 0 0 0 0 0 0 0  PHQ- 9 Score  1 0 0 0       Diabetes: Patient presents for follow up of diabetes. Current symptoms include: hyperglycemia. Known diabetic complications: none. Medication compliance: yes. Current diet: in general, a "healthy" diet  . Current exercise: none. Home blood sugar records:  fasting 100-130; at night 130-200 . Is he  on ACE inhibitor or angiotensin II receptor blocker? Yes. Is he on a statin? No.   Gout: Patient has had no flares in almost two years.  He is taking the allopurinol 200 mg once daily. He does have colchicine to take as needed.   Hypertension: Patient reports his blood pressure is well controlled at home.   GERD: taking pantoprazole.   Erectile Dysfunction: uses sildenafil as needed.   COPD: using Spiriva daily. He is needing Albuterol rarely.    Review of  Systems  Constitutional:  Negative for chills, fever, malaise/fatigue and weight loss.  HENT:  Negative for congestion, ear discharge, ear pain, nosebleeds, sinus pain, sore throat and tinnitus.   Eyes:  Negative for blurred vision, double vision, pain, discharge and redness.       Close up vision worsened after having COVID last year. He has not seen an eye doctor.  Respiratory:  Negative for cough, shortness of breath and wheezing.   Cardiovascular:  Negative for chest pain, palpitations and leg swelling.  Gastrointestinal:  Positive for diarrhea. Negative for abdominal pain, constipation, heartburn, nausea and vomiting.  Genitourinary:  Negative for dysuria, frequency and urgency.       Denies trouble initiating a urine stream, weak stream, split stream, nocturia, and dribbling.   Musculoskeletal:  Negative for myalgias.  Skin:  Negative for rash.  Neurological:  Negative for dizziness, seizures, weakness and headaches.  Psychiatric/Behavioral:  Negative for depression, substance abuse and suicidal ideas. The patient is not nervous/anxious.      Current Outpatient Medications:  8. Abnormal CT scan of lung - Ambulatory referral to Pulmonology   Follow-up: Return in about 6 months (around 09/24/2022) for follow-up of chronic medication conditions with M. Rakes.   Hendricks Limes, MSN, APRN, FNP-C [facility]  Subjective:  Patient ID: Chase Christensen,  male    DOB: 05-28-1971  Age: 51 y.o. MRN: [id number]  Patient Care Team: Loman Brooklyn, FNP as PCP - General (Family Medicine)   CC:  Chief Complaint  Patient presents with   Annual Exam    HPI Chase Christensen is a 51 y.o. male who presents today for a complete physical exam. He reports consuming a  healthy/diabetic  diet. The patient does not participate in regular exercise at present. He generally feels well. He reports sleeping well. He does not have additional problems to discuss today.   Vision:Not within last year Dental:Receives regular dental care NIO:EVOJJKKX cancer screening and PSA options (with potential risks and benefits of testing vs. not testing) were discussed along with recent recs/guidelines.   Lab Results  Component Value Date   PSA1 1.6 09/13/2020   PSA1 1.5 10/13/2019   PSA1 1.5 07/21/2017   Lung Cancer Screening with low-dose Chest CT: 10/25/2021  DEPRESSION SCREENING    03/26/2022    9:22 AM 12/27/2021    8:42 AM 09/17/2021    8:10 AM 08/02/2021    9:04 AM 03/19/2021    9:19 AM 09/13/2020    8:05 AM 06/18/2020    2:43 PM  PHQ 2/9 Scores  PHQ - 2 Score 0 0 0 0 0 0 0  PHQ- 9 Score  1 0 0 0       Diabetes: Patient presents for follow up of diabetes. Current symptoms include: hyperglycemia. Known diabetic complications: none. Medication compliance: yes. Current diet: in general, a "healthy" diet  . Current exercise: none. Home blood sugar records:  fasting 100-130; at night 130-200 . Is he  on ACE inhibitor or angiotensin II receptor blocker? Yes. Is he on a statin? No.   Gout: Patient has had no flares in almost two years.  He is taking the allopurinol 200 mg once daily. He does have colchicine to take as needed.   Hypertension: Patient reports his blood pressure is well controlled at home.   GERD: taking pantoprazole.   Erectile Dysfunction: uses sildenafil as needed.   COPD: using Spiriva daily. He is needing Albuterol rarely.    Review of  Systems  Constitutional:  Negative for chills, fever, malaise/fatigue and weight loss.  HENT:  Negative for congestion, ear discharge, ear pain, nosebleeds, sinus pain, sore throat and tinnitus.   Eyes:  Negative for blurred vision, double vision, pain, discharge and redness.       Close up vision worsened after having COVID last year. He has not seen an eye doctor.  Respiratory:  Negative for cough, shortness of breath and wheezing.   Cardiovascular:  Negative for chest pain, palpitations and leg swelling.  Gastrointestinal:  Positive for diarrhea. Negative for abdominal pain, constipation, heartburn, nausea and vomiting.  Genitourinary:  Negative for dysuria, frequency and urgency.       Denies trouble initiating a urine stream, weak stream, split stream, nocturia, and dribbling.   Musculoskeletal:  Negative for myalgias.  Skin:  Negative for rash.  Neurological:  Negative for dizziness, seizures, weakness and headaches.  Psychiatric/Behavioral:  Negative for depression, substance abuse and suicidal ideas. The patient is not nervous/anxious.      Current Outpatient Medications:  8. Abnormal CT scan of lung - Ambulatory referral to Pulmonology   Follow-up: Return in about 6 months (around 09/24/2022) for follow-up of chronic medication conditions with M. Rakes.   Hendricks Limes, MSN, APRN, FNP-C [facility]  Subjective:  Patient ID: Chase Christensen,  male    DOB: 05-28-1971  Age: 51 y.o. MRN: [id number]  Patient Care Team: Loman Brooklyn, FNP as PCP - General (Family Medicine)   CC:  Chief Complaint  Patient presents with   Annual Exam    HPI Chase Christensen is a 51 y.o. male who presents today for a complete physical exam. He reports consuming a  healthy/diabetic  diet. The patient does not participate in regular exercise at present. He generally feels well. He reports sleeping well. He does not have additional problems to discuss today.   Vision:Not within last year Dental:Receives regular dental care NIO:EVOJJKKX cancer screening and PSA options (with potential risks and benefits of testing vs. not testing) were discussed along with recent recs/guidelines.   Lab Results  Component Value Date   PSA1 1.6 09/13/2020   PSA1 1.5 10/13/2019   PSA1 1.5 07/21/2017   Lung Cancer Screening with low-dose Chest CT: 10/25/2021  DEPRESSION SCREENING    03/26/2022    9:22 AM 12/27/2021    8:42 AM 09/17/2021    8:10 AM 08/02/2021    9:04 AM 03/19/2021    9:19 AM 09/13/2020    8:05 AM 06/18/2020    2:43 PM  PHQ 2/9 Scores  PHQ - 2 Score 0 0 0 0 0 0 0  PHQ- 9 Score  1 0 0 0       Diabetes: Patient presents for follow up of diabetes. Current symptoms include: hyperglycemia. Known diabetic complications: none. Medication compliance: yes. Current diet: in general, a "healthy" diet  . Current exercise: none. Home blood sugar records:  fasting 100-130; at night 130-200 . Is he  on ACE inhibitor or angiotensin II receptor blocker? Yes. Is he on a statin? No.   Gout: Patient has had no flares in almost two years.  He is taking the allopurinol 200 mg once daily. He does have colchicine to take as needed.   Hypertension: Patient reports his blood pressure is well controlled at home.   GERD: taking pantoprazole.   Erectile Dysfunction: uses sildenafil as needed.   COPD: using Spiriva daily. He is needing Albuterol rarely.    Review of  Systems  Constitutional:  Negative for chills, fever, malaise/fatigue and weight loss.  HENT:  Negative for congestion, ear discharge, ear pain, nosebleeds, sinus pain, sore throat and tinnitus.   Eyes:  Negative for blurred vision, double vision, pain, discharge and redness.       Close up vision worsened after having COVID last year. He has not seen an eye doctor.  Respiratory:  Negative for cough, shortness of breath and wheezing.   Cardiovascular:  Negative for chest pain, palpitations and leg swelling.  Gastrointestinal:  Positive for diarrhea. Negative for abdominal pain, constipation, heartburn, nausea and vomiting.  Genitourinary:  Negative for dysuria, frequency and urgency.       Denies trouble initiating a urine stream, weak stream, split stream, nocturia, and dribbling.   Musculoskeletal:  Negative for myalgias.  Skin:  Negative for rash.  Neurological:  Negative for dizziness, seizures, weakness and headaches.  Psychiatric/Behavioral:  Negative for depression, substance abuse and suicidal ideas. The patient is not nervous/anxious.      Current Outpatient Medications:  8. Abnormal CT scan of lung - Ambulatory referral to Pulmonology   Follow-up: Return in about 6 months (around 09/24/2022) for follow-up of chronic medication conditions with M. Rakes.   Hendricks Limes, MSN, APRN, FNP-C [facility]  Subjective:  Patient ID: Chase Christensen,  male    DOB: 01/19/1971  Age: 51 y.o. MRN: [id number]  Patient Care Team: Loman Brooklyn, FNP as PCP - General (Family Medicine)   CC:  Chief Complaint  Patient presents with   Annual Exam    HPI Chase Christensen is a 51 y.o. male who presents today for a complete physical exam. He reports consuming a  healthy/diabetic  diet. The patient does not participate in regular exercise at present. He generally feels well. He reports sleeping well. He does not have additional problems to discuss today.   Vision:Not within last year Dental:Receives regular dental care NIO:EVOJJKKX cancer screening and PSA options (with potential risks and benefits of testing vs. not testing) were discussed along with recent recs/guidelines.   Lab Results  Component Value Date   PSA1 1.6 09/13/2020   PSA1 1.5 10/13/2019   PSA1 1.5 07/21/2017   Lung Cancer Screening with low-dose Chest CT: 10/25/2021  DEPRESSION SCREENING    03/26/2022    9:22 AM 12/27/2021    8:42 AM 09/17/2021    8:10 AM 08/02/2021    9:04 AM 03/19/2021    9:19 AM 09/13/2020    8:05 AM 06/18/2020    2:43 PM  PHQ 2/9 Scores  PHQ - 2 Score 0 0 0 0 0 0 0  PHQ- 9 Score  1 0 0 0       Diabetes: Patient presents for follow up of diabetes. Current symptoms include: hyperglycemia. Known diabetic complications: none. Medication compliance: yes. Current diet: in general, a "healthy" diet  . Current exercise: none. Home blood sugar records:  fasting 100-130; at night 130-200 . Is he  on ACE inhibitor or angiotensin II receptor blocker? Yes. Is he on a statin? No.   Gout: Patient has had no flares in almost two years.  He is taking the allopurinol 200 mg once daily. He does have colchicine to take as needed.   Hypertension: Patient reports his blood pressure is well controlled at home.   GERD: taking pantoprazole.   Erectile Dysfunction: uses sildenafil as needed.   COPD: using Spiriva daily. He is needing Albuterol rarely.    Review of  Systems  Constitutional:  Negative for chills, fever, malaise/fatigue and weight loss.  HENT:  Negative for congestion, ear discharge, ear pain, nosebleeds, sinus pain, sore throat and tinnitus.   Eyes:  Negative for blurred vision, double vision, pain, discharge and redness.       Close up vision worsened after having COVID last year. He has not seen an eye doctor.  Respiratory:  Negative for cough, shortness of breath and wheezing.   Cardiovascular:  Negative for chest pain, palpitations and leg swelling.  Gastrointestinal:  Positive for diarrhea. Negative for abdominal pain, constipation, heartburn, nausea and vomiting.  Genitourinary:  Negative for dysuria, frequency and urgency.       Denies trouble initiating a urine stream, weak stream, split stream, nocturia, and dribbling.   Musculoskeletal:  Negative for myalgias.  Skin:  Negative for rash.  Neurological:  Negative for dizziness, seizures, weakness and headaches.  Psychiatric/Behavioral:  Negative for depression, substance abuse and suicidal ideas. The patient is not nervous/anxious.      Current Outpatient Medications:  8. Abnormal CT scan of lung - Ambulatory referral to Pulmonology   Follow-up: Return in about 6 months (around 09/24/2022) for follow-up of chronic medication conditions with M. Rakes.   Hendricks Limes, MSN, APRN, FNP-C [facility]  Subjective:  Patient ID: Chase Christensen,  male    DOB: 01/19/1971  Age: 51 y.o. MRN: [id number]  Patient Care Team: Loman Brooklyn, FNP as PCP - General (Family Medicine)   CC:  Chief Complaint  Patient presents with   Annual Exam    HPI Chase Christensen is a 51 y.o. male who presents today for a complete physical exam. He reports consuming a  healthy/diabetic  diet. The patient does not participate in regular exercise at present. He generally feels well. He reports sleeping well. He does not have additional problems to discuss today.   Vision:Not within last year Dental:Receives regular dental care NIO:EVOJJKKX cancer screening and PSA options (with potential risks and benefits of testing vs. not testing) were discussed along with recent recs/guidelines.   Lab Results  Component Value Date   PSA1 1.6 09/13/2020   PSA1 1.5 10/13/2019   PSA1 1.5 07/21/2017   Lung Cancer Screening with low-dose Chest CT: 10/25/2021  DEPRESSION SCREENING    03/26/2022    9:22 AM 12/27/2021    8:42 AM 09/17/2021    8:10 AM 08/02/2021    9:04 AM 03/19/2021    9:19 AM 09/13/2020    8:05 AM 06/18/2020    2:43 PM  PHQ 2/9 Scores  PHQ - 2 Score 0 0 0 0 0 0 0  PHQ- 9 Score  1 0 0 0       Diabetes: Patient presents for follow up of diabetes. Current symptoms include: hyperglycemia. Known diabetic complications: none. Medication compliance: yes. Current diet: in general, a "healthy" diet  . Current exercise: none. Home blood sugar records:  fasting 100-130; at night 130-200 . Is he  on ACE inhibitor or angiotensin II receptor blocker? Yes. Is he on a statin? No.   Gout: Patient has had no flares in almost two years.  He is taking the allopurinol 200 mg once daily. He does have colchicine to take as needed.   Hypertension: Patient reports his blood pressure is well controlled at home.   GERD: taking pantoprazole.   Erectile Dysfunction: uses sildenafil as needed.   COPD: using Spiriva daily. He is needing Albuterol rarely.    Review of  Systems  Constitutional:  Negative for chills, fever, malaise/fatigue and weight loss.  HENT:  Negative for congestion, ear discharge, ear pain, nosebleeds, sinus pain, sore throat and tinnitus.   Eyes:  Negative for blurred vision, double vision, pain, discharge and redness.       Close up vision worsened after having COVID last year. He has not seen an eye doctor.  Respiratory:  Negative for cough, shortness of breath and wheezing.   Cardiovascular:  Negative for chest pain, palpitations and leg swelling.  Gastrointestinal:  Positive for diarrhea. Negative for abdominal pain, constipation, heartburn, nausea and vomiting.  Genitourinary:  Negative for dysuria, frequency and urgency.       Denies trouble initiating a urine stream, weak stream, split stream, nocturia, and dribbling.   Musculoskeletal:  Negative for myalgias.  Skin:  Negative for rash.  Neurological:  Negative for dizziness, seizures, weakness and headaches.  Psychiatric/Behavioral:  Negative for depression, substance abuse and suicidal ideas. The patient is not nervous/anxious.      Current Outpatient Medications:

## 2022-03-26 NOTE — Patient Instructions (Signed)
Please schedule your eye exam.  Please complete your Cologuard by the end of October.

## 2022-03-27 LAB — CMP14+EGFR
ALT: 27 IU/L (ref 0–44)
AST: 23 IU/L (ref 0–40)
Albumin/Globulin Ratio: 1.9 (ref 1.2–2.2)
Albumin: 4 g/dL — ABNORMAL LOW (ref 4.1–5.1)
Alkaline Phosphatase: 80 IU/L (ref 44–121)
BUN/Creatinine Ratio: 14 (ref 9–20)
BUN: 15 mg/dL (ref 6–24)
Bilirubin Total: 0.4 mg/dL (ref 0.0–1.2)
CO2: 19 mmol/L — ABNORMAL LOW (ref 20–29)
Calcium: 9.5 mg/dL (ref 8.7–10.2)
Chloride: 109 mmol/L — ABNORMAL HIGH (ref 96–106)
Creatinine, Ser: 1.04 mg/dL (ref 0.76–1.27)
Globulin, Total: 2.1 g/dL (ref 1.5–4.5)
Glucose: 125 mg/dL — ABNORMAL HIGH (ref 70–99)
Potassium: 4.7 mmol/L (ref 3.5–5.2)
Sodium: 144 mmol/L (ref 134–144)
Total Protein: 6.1 g/dL (ref 6.0–8.5)
eGFR: 87 mL/min/{1.73_m2} (ref >59)

## 2022-03-27 LAB — CBC WITH DIFFERENTIAL/PLATELET
Basophils Absolute: 0.1 10*3/uL (ref 0.0–0.2)
Basos: 1 %
EOS (ABSOLUTE): 0.3 10*3/uL (ref 0.0–0.4)
Eos: 4 %
Hematocrit: 47.1 % (ref 37.5–51.0)
Hemoglobin: 16.3 g/dL (ref 13.0–17.7)
Immature Grans (Abs): 0 10*3/uL (ref 0.0–0.1)
Immature Granulocytes: 0 %
Lymphocytes Absolute: 2 10*3/uL (ref 0.7–3.1)
Lymphs: 20 %
MCH: 32.6 pg (ref 26.6–33.0)
MCHC: 34.6 g/dL (ref 31.5–35.7)
MCV: 94 fL (ref 79–97)
Monocytes Absolute: 0.6 10*3/uL (ref 0.1–0.9)
Monocytes: 6 %
Neutrophils Absolute: 6.8 10*3/uL (ref 1.4–7.0)
Neutrophils: 69 %
Platelets: 204 10*3/uL (ref 150–450)
RBC: 5 x10E6/uL (ref 4.14–5.80)
RDW: 11.5 % — ABNORMAL LOW (ref 11.6–15.4)
WBC: 9.8 10*3/uL (ref 3.4–10.8)

## 2022-03-27 LAB — LIPID PANEL
Chol/HDL Ratio: 3.9 ratio (ref 0.0–5.0)
Cholesterol, Total: 124 mg/dL (ref 100–199)
HDL: 32 mg/dL — ABNORMAL LOW (ref >39)
LDL Chol Calc (NIH): 71 mg/dL (ref 0–99)
Triglycerides: 115 mg/dL (ref 0–149)
VLDL Cholesterol Cal: 21 mg/dL (ref 5–40)

## 2022-03-27 LAB — MICROALBUMIN / CREATININE URINE RATIO
Creatinine, Urine: 145.2 mg/dL
Microalb/Creat Ratio: 36 mg/g creat — ABNORMAL HIGH (ref 0–29)
Microalbumin, Urine: 52.4 ug/mL

## 2022-05-07 ENCOUNTER — Ambulatory Visit: Payer: BC Managed Care – PPO | Admitting: Pulmonary Disease

## 2022-05-07 ENCOUNTER — Encounter: Payer: Self-pay | Admitting: Pulmonary Disease

## 2022-05-07 VITALS — BP 122/84 | HR 72 | Temp 98.4°F | Ht 75.0 in | Wt 256.6 lb

## 2022-05-07 DIAGNOSIS — J449 Chronic obstructive pulmonary disease, unspecified: Secondary | ICD-10-CM

## 2022-05-07 DIAGNOSIS — J432 Centrilobular emphysema: Secondary | ICD-10-CM | POA: Diagnosis not present

## 2022-05-07 DIAGNOSIS — R590 Localized enlarged lymph nodes: Secondary | ICD-10-CM | POA: Diagnosis not present

## 2022-05-07 DIAGNOSIS — R599 Enlarged lymph nodes, unspecified: Secondary | ICD-10-CM | POA: Diagnosis not present

## 2022-05-07 DIAGNOSIS — G4734 Idiopathic sleep related nonobstructive alveolar hypoventilation: Secondary | ICD-10-CM | POA: Diagnosis not present

## 2022-05-07 DIAGNOSIS — Z720 Tobacco use: Secondary | ICD-10-CM

## 2022-05-07 NOTE — Assessment & Plan Note (Signed)
22-month follow-up CT chest with contrast will be obtained to better define paratracheal lymphadenopathy which was noted to be 10 mm on CT scan in April.

## 2022-05-07 NOTE — Patient Instructions (Addendum)
You have emphysema  X PFTs X CT chest w con for enlarged LNs   X Nocturnal oximetry  You have to QUIT smoking!!

## 2022-05-07 NOTE — Assessment & Plan Note (Signed)
Review of the CT scan shows severe emphysema. PFTs were obtained to quantitate. He will continue on Spiriva for now but he will need to be stepped up to either LABA/LAMA or triple therapy. We discussed COPD action plan and signs and symptoms of COPD exacerbation

## 2022-05-07 NOTE — Assessment & Plan Note (Signed)
Smoking cessation was strongly encouraged as the main intervention that would add years to his life. He will try nicotine patch 21 mg.  In the future we can add Chantix or Nicotrol inhaler

## 2022-05-07 NOTE — Progress Notes (Signed)
Subjective:    Patient ID: Chase Christensen, male    DOB: 16-Mar-1971, 51 y.o.   MRN: [id number]  HPI  Chief Complaint  Patient presents with   Consult    Abnormal CT scan done at [facility]  States his O2 levels drop at night time.  Refused flu vax    51 year old smoker presents for evaluation of abnormal CT scan He works as a Production designer, theatre/television/film.  He has smoked 1 to 1.5 packs/day starting as a teenager, more than 30 pack years, he also chews tobacco. I have reviewed his previous office visits with primary care.  Repeat is ended in January 2023 with oxygen saturation ranging from 89 to 92% and right-sided chest pain and rhonchi were auscultated, chest x-ray was negative he was given albuterol MDI and later on Spiriva was added CT chest was obtained in April He had an ED visit 12/2021 and was diagnosed with new onset diabetes  He reports excessive daytime somnolence and fatigue.  He sleeps on his side with 2 pillows, bedtime can be anywhere between 10 PM and 2 AM, he wakes up around 6 AM and is never rested.  Soft snoring has been noted by his wife, no witnessed apneas.  He denies choking or gasping episodes in his sleep. He has a Agricultural consultant He underwent nocturnal polysomnogram but could not sleep at all.  He subsequently underwent home sleep test somewhere around 2018 he was placed on CPAP which she did not tolerate and ultimately treated during the machine.  He was told by a sleep doctor that he does not actually have OSA but nocturnal hypoxia.  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  Significant tests/ events reviewed  10/2021 CT chest wo con >>1 cm pretracheal LN, Moderate paraseptal emphysema   Past Medical History:  Diagnosis Date   COPD (chronic obstructive pulmonary disease) (HCC)    Diabetes (HCC) 09/17/2021   ED (erectile dysfunction)    Gout    Hypertension    Pneumonia    Prediabetes 09/18/2020    Past Surgical History:   Procedure Laterality Date   KNEE ARTHROSCOPY Right 03/15/2015   WISDOM TOOTH EXTRACTION      Allergies  Allergen Reactions   Penicillins Rash    Social History   Socioeconomic History   Marital status: Divorced    Spouse name: Not on file   Number of children: 1   Years of education: Not on file   Highest education level: Not on file  Occupational History   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  Other Topics Concern   Not on file  Social History Narrative   Not on file   Social Determinants of Health   Financial Resource Strain: Not on file  Food Insecurity: Not on file  Transportation Needs: Not on file  Physical Activity: Not on file  Stress: Not on file  Social Connections: Not on file  Intimate Partner Violence: Not on file    Family History  Problem Relation Age of Onset   Hypertension Mother  Diabetes Maternal Grandmother    Lung cancer Maternal Grandfather    Pancreatic cancer Paternal Grandmother    Colon cancer Paternal Uncle    Colon polyps Cousin    Cancer Other        all paternal uncles and anunts passed with cancer     Review of Systems Constitutional: negative for anorexia, fevers and sweats  Eyes: negative for irritation, redness and visual disturbance  Ears, nose, mouth, throat, and face: negative for earaches, epistaxis and sore throat  Respiratory: negative for sputum and wheezing  Cardiovascular: negative for chest pain, dyspnea, lower extremity edema, orthopnea, palpitations and syncope  Gastrointestinal: negative for abdominal pain, constipation, diarrhea, melena, nausea and vomiting  Genitourinary:negative for  dysuria, frequency and hematuria  Hematologic/lymphatic: negative for bleeding, easy bruising and lymphadenopathy  Musculoskeletal:negative for arthralgias, muscle weakness and stiff joints  Neurological: negative for coordination problems, gait problems and weakness  Endocrine: negative for diabetic symptoms including polydipsia, polyuria and weight loss     Objective:   Physical Exam  Gen. Pleasant, obese, in no distress, normal affect ENT - no pallor,icterus, no post nasal drip, class 2 airway, 1-2 mm underbite Neck: No JVD, no thyromegaly, no carotid bruits Lungs: no use of accessory muscles, no dullness to percussion, decreased without rales or rhonchi  Cardiovascular: Rhythm regular, heart sounds  normal, no murmurs or gallops, no peripheral edema Abdomen: soft and non-tender, no hepatosplenomegaly, BS normal. Musculoskeletal: No deformities, no cyanosis or clubbing Neuro:  alert, non focal, no tremors       Assessment & Plan:   Possible OSA versus nocturnal hypoxia -we discussed about possible home sleep test.  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 We will obtain nocturnal oximetry on room air.  The pathophysiology of obstructive sleep apnea , it's cardiovascular consequences & modes of treatment including CPAP were discused with the patient in detail & they evidenced understanding.

## 2022-05-08 ENCOUNTER — Ambulatory Visit (INDEPENDENT_AMBULATORY_CARE_PROVIDER_SITE_OTHER): Payer: BC Managed Care – PPO | Admitting: Family Medicine

## 2022-05-08 ENCOUNTER — Encounter: Payer: Self-pay | Admitting: Family Medicine

## 2022-05-08 DIAGNOSIS — F17210 Nicotine dependence, cigarettes, uncomplicated: Secondary | ICD-10-CM

## 2022-05-08 DIAGNOSIS — J069 Acute upper respiratory infection, unspecified: Secondary | ICD-10-CM

## 2022-05-08 DIAGNOSIS — J449 Chronic obstructive pulmonary disease, unspecified: Secondary | ICD-10-CM | POA: Diagnosis not present

## 2022-05-08 NOTE — Progress Notes (Signed)
   Virtual Visit  Note Due to COVID-19 pandemic this visit was conducted virtually. This visit type was conducted due to national recommendations for restrictions regarding the COVID-19 Pandemic (e.g. social distancing, sheltering in place) in an effort to limit this patient's exposure and mitigate transmission in our community. All issues noted in this document were discussed and addressed.  A physical exam was not performed with this format.  I connected with Chase Christensen on 05/08/22 at 1317 by telephone and verified that I am speaking with the correct person using two identifiers. Chase Christensen is currently located at home and no one is currently with him during the visit. The provider, Gwenlyn Perking, FNP is located in their office at time of visit.  I discussed the limitations, risks, security and privacy concerns of performing an evaluation and management service by telephone and the availability of in person appointments. I also discussed with the patient that there may be a patient responsible charge related to this service. The patient expressed understanding and agreed to proceed.   History and Present Illness:  Sinusitis This is a new problem. The current episode started today. There has been no fever. Associated symptoms include chills (yesterday), congestion, coughing, headaches, sinus pressure and a sore throat. Pertinent negatives include no shortness of breath. Past treatments include nothing.  Denies sputum production, chest pain, wheezing, or shortness of breath. He was diagnosed with COPD yesterday at his pulmonology visit. He has had a negative at home Covid test.     Review of Systems  Constitutional:  Positive for chills (yesterday).  HENT:  Positive for congestion, sinus pressure and sore throat.   Respiratory:  Positive for cough. Negative for shortness of breath.   Neurological:  Positive for headaches.     Observations/Objective: Alert and oriented x 3. Able  to speak in full sentences without difficulty.    Assessment and Plan: Chase Christensen was seen today for sinusitis.  Diagnoses and all orders for this visit:  Viral URI Negative home Covid test. Declined flu test. Discussed symptomatic care and return precautions.   COPD without exacerbation ([place]) No signs of exacerbation. Discussed symptomatic care and return precautions.   Follow Up Instructions: As needed.     I discussed the assessment and treatment plan with the patient. The patient was provided an opportunity to ask questions and all were answered. The patient agreed with the plan and demonstrated an understanding of the instructions.   The patient was advised to call back or seek an in-person evaluation if the symptoms worsen or if the condition fails to improve as anticipated.  The above assessment and management plan was discussed with the patient. The patient verbalized understanding of and has agreed to the management plan. Patient is aware to call the clinic if symptoms persist or worsen. Patient is aware when to return to the clinic for a follow-up visit. Patient educated on when it is appropriate to go to the emergency department.   Time call ended:  1330  I provided 13 minutes of  non face-to-face time during this encounter.    Gwenlyn Perking, FNP

## 2022-05-12 ENCOUNTER — Ambulatory Visit: Payer: Self-pay

## 2022-05-12 ENCOUNTER — Ambulatory Visit: Payer: BC Managed Care – PPO | Admitting: Orthopedic Surgery

## 2022-05-12 DIAGNOSIS — G8929 Other chronic pain: Secondary | ICD-10-CM

## 2022-05-12 DIAGNOSIS — M79671 Pain in right foot: Secondary | ICD-10-CM

## 2022-05-12 DIAGNOSIS — M5412 Radiculopathy, cervical region: Secondary | ICD-10-CM

## 2022-05-12 DIAGNOSIS — M79672 Pain in left foot: Secondary | ICD-10-CM

## 2022-05-12 NOTE — Progress Notes (Signed)
Office Visit Note   Patient: Chase Christensen           Date of Birth: 1971-01-04           MRN: [id number] Visit Date: 05/12/2022              Requested by: Sonny Masters, FNP [address] [place],  [place] [address] PCP: Sonny Masters, FNP  Chief Complaint  Patient presents with   Neck - Pain   Right Foot - Pain    Club foot   Left Foot - Pain    Club foot      HPI: Patient is a 51 year old gentleman who is seen for bilateral midfoot pain as well as history of chronic radicular pain from the cervical spine down the right upper extremity.  Patient states he also has some occasional right groin pain.  Patient states he has a history of degenerative disc disease of his cervical spine.  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  Patient states he has a new diagnosis of diabetes and is on oral medications history of gout currently on 200 mg of allopurinol a day.  Assessment & Plan: Visit Diagnoses:  1. Bilateral foot pain     Plan: Recommended Achilles stretching and stiff soled shoes.  Discussed that if he fails conservative treatment a midfoot fusion would be an option but with patient's multiple comorbidities there would be increased risks with surgery.  Follow-Up Instructions: No follow-ups on file.   Ortho Exam  Patient is alert, oriented, no adenopathy, well-dressed, normal affect, normal respiratory effort. Examination patient has a palpable pulse bilaterally.  He has bony spurs across the midfoot.  He has Achilles contracture with dorsiflexion only to neutral with the knee extended.  Patient reports a history of occasional cystic swelling across the midfoot.  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  With osteophytic bone spurs talonavicular and navicular cuneiform most prominent.  Imaging: No results found. No images are attached to the encounter.  Labs: Lab Results  Component Value Date   HGBA1C 6.7 (H)  03/26/2022   HGBA1C 9.3 (H) 12/27/2021   HGBA1C 7.0 (H) 09/17/2021   LABURIC 6.8 10/13/2019   LABURIC 5.7 03/08/2019   LABURIC 9.2 (H) 03/02/2018     Lab Results  Component Value Date   ALBUMIN 4.0 (L) 03/26/2022   ALBUMIN 4.5 09/17/2021   ALBUMIN 4.2 03/19/2021    No results found for: "MG" No results found for: "VD25OH"  No results found for: "PREALBUMIN"    Latest Ref Rng & Units 03/26/2022    9:14 AM 12/24/2021    2:40 AM 12/23/2021   11:28 PM  CBC EXTENDED  WBC 3.4 - 10.8 x10E3/uL 9.8   11.3   RBC 4.14 - 5.80 x10E6/uL 5.00   5.07   Hemoglobin 13.0 - 17.7 g/dL 19.1  47.8  29.5   HCT 37.5 - 51.0 % 47.1  43.0  45.9   Platelets 150 - 450 x10E3/uL 204   216   NEUT# 1.4 - 7.0 x10E3/uL 6.8     Lymph# 0.7 - 3.1 x10E3/uL 2.0        There is no height or weight on file to calculate BMI.  Orders:  Orders Placed This Encounter  Procedures   XR Foot 2 Views Right   XR Foot 2 Views Left   No orders of the defined types were placed in this encounter.  Procedures: No procedures performed  Clinical Data: No additional findings.  ROS:  All other systems negative, except as noted in the HPI. Review of Systems  Objective: Vital Signs: There were no vitals taken for this visit.  Specialty Comments:  No specialty comments available.  PMFS History: Patient Active Problem List   Diagnosis Date Noted   Mediastinal lymphadenopathy 05/07/2022   Type 2 diabetes mellitus with hyperglycemia, without long-term current use of insulin (HCC) 09/17/2021   COPD without exacerbation (HCC) 03/19/2021   Controlled gout 09/13/2020   Gastroesophageal reflux disease 08/27/2017   Erectile dysfunction 06/08/2015   Essential hypertension 05/03/2015   Tobacco use 05/03/2015   Past Medical History:  Diagnosis Date   COPD (chronic obstructive pulmonary disease) (HCC)    Diabetes (HCC) 09/17/2021   ED (erectile dysfunction)    Gout    Hypertension    Pneumonia    Prediabetes  09/18/2020    Family History  Problem Relation Age of Onset   Hypertension Mother    Diabetes Maternal Grandmother    Lung cancer Maternal Grandfather    Pancreatic cancer Paternal Grandmother    Colon cancer Paternal Uncle    Colon polyps Cousin    Cancer Other        all paternal uncles and anunts passed with cancer    Past Surgical History:  Procedure Laterality Date   KNEE ARTHROSCOPY Right 03/15/2015   WISDOM TOOTH EXTRACTION     Social History   Occupational History   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

## 2022-06-03 ENCOUNTER — Ambulatory Visit (INDEPENDENT_AMBULATORY_CARE_PROVIDER_SITE_OTHER): Payer: BC Managed Care – PPO | Admitting: Pulmonary Disease

## 2022-06-03 DIAGNOSIS — J432 Centrilobular emphysema: Secondary | ICD-10-CM

## 2022-06-03 LAB — PULMONARY FUNCTION TEST
DL/VA % pred: 90 %
DL/VA: 3.9 ml/min/mmHg/L
DLCO cor % pred: 85 %
DLCO cor: 30.01 ml/min/mmHg
DLCO unc % pred: 84 %
DLCO unc: 29.75 ml/min/mmHg
FEF 25-75 Post: 3.32 L/sec
FEF 25-75 Pre: 2.13 L/sec
FEF2575-%Change-Post: 56 %
FEF2575-%Pred-Post: 81 %
FEF2575-%Pred-Pre: 52 %
FEV1-%Change-Post: 14 %
FEV1-%Pred-Post: 75 %
FEV1-%Pred-Pre: 66 %
FEV1-Post: 3.59 L
FEV1-Pre: 3.14 L
FEV1FVC-%Change-Post: 2 %
FEV1FVC-%Pred-Pre: 89 %
FEV6-%Change-Post: 11 %
FEV6-%Pred-Post: 83 %
FEV6-%Pred-Pre: 75 %
FEV6-Post: 4.95 L
FEV6-Pre: 4.45 L
FEV6FVC-%Change-Post: 0 %
FEV6FVC-%Pred-Post: 102 %
FEV6FVC-%Pred-Pre: 102 %
FVC-%Change-Post: 11 %
FVC-%Pred-Post: 81 %
FVC-%Pred-Pre: 73 %
FVC-Post: 4.99 L
FVC-Pre: 4.49 L
Post FEV1/FVC ratio: 72 %
Post FEV6/FVC ratio: 99 %
Pre FEV1/FVC ratio: 70 %
Pre FEV6/FVC Ratio: 99 %
RV % pred: 86 %
RV: 2.05 L
TLC % pred: 90 %
TLC: 7.44 L

## 2022-06-03 NOTE — Progress Notes (Signed)
Full PFT performed today. °

## 2022-06-03 NOTE — Patient Instructions (Signed)
Full PFT performed today. °

## 2022-06-10 ENCOUNTER — Telehealth: Payer: Self-pay | Admitting: Pulmonary Disease

## 2022-06-10 DIAGNOSIS — G4734 Idiopathic sleep related nonobstructive alveolar hypoventilation: Secondary | ICD-10-CM

## 2022-06-10 NOTE — Telephone Encounter (Signed)
Called and spoke with patient. He does not want to proceed with HST but is willing to try Nocturnal O2. Order paled. Nothing further needed

## 2022-06-10 NOTE — Telephone Encounter (Signed)
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

## 2022-06-17 ENCOUNTER — Ambulatory Visit: Payer: BC Managed Care – PPO | Admitting: Family

## 2022-06-18 ENCOUNTER — Ambulatory Visit (HOSPITAL_COMMUNITY)
Admission: RE | Admit: 2022-06-18 | Discharge: 2022-06-18 | Disposition: A | Discharge status: Home / Self Care | Payer: BC Managed Care – PPO | Source: Ambulatory Visit | Attending: Pulmonary Disease | Admitting: Pulmonary Disease

## 2022-06-18 DIAGNOSIS — J439 Emphysema, unspecified: Secondary | ICD-10-CM | POA: Diagnosis not present

## 2022-06-18 DIAGNOSIS — R590 Localized enlarged lymph nodes: Secondary | ICD-10-CM | POA: Diagnosis not present

## 2022-06-18 LAB — POCT I-STAT CREATININE: Creatinine, Ser: 1 mg/dL (ref 0.61–1.24)

## 2022-06-18 MED ORDER — IOHEXOL 300 MG/ML  SOLN
75.0000 mL | Freq: Once | INTRAMUSCULAR | Status: AC | PRN
  Administered 2022-06-18: 75 mL via INTRAVENOUS

## 2022-06-19 IMAGING — DX DG CHEST 2V
3 series · 3 of 3 positions shown · non-contrast
Comparison: 08/02/2021

CLINICAL DATA: Cough x1 week, COPD

EXAM:
CHEST - 2 VIEW

[chest pa (1 of 2)]
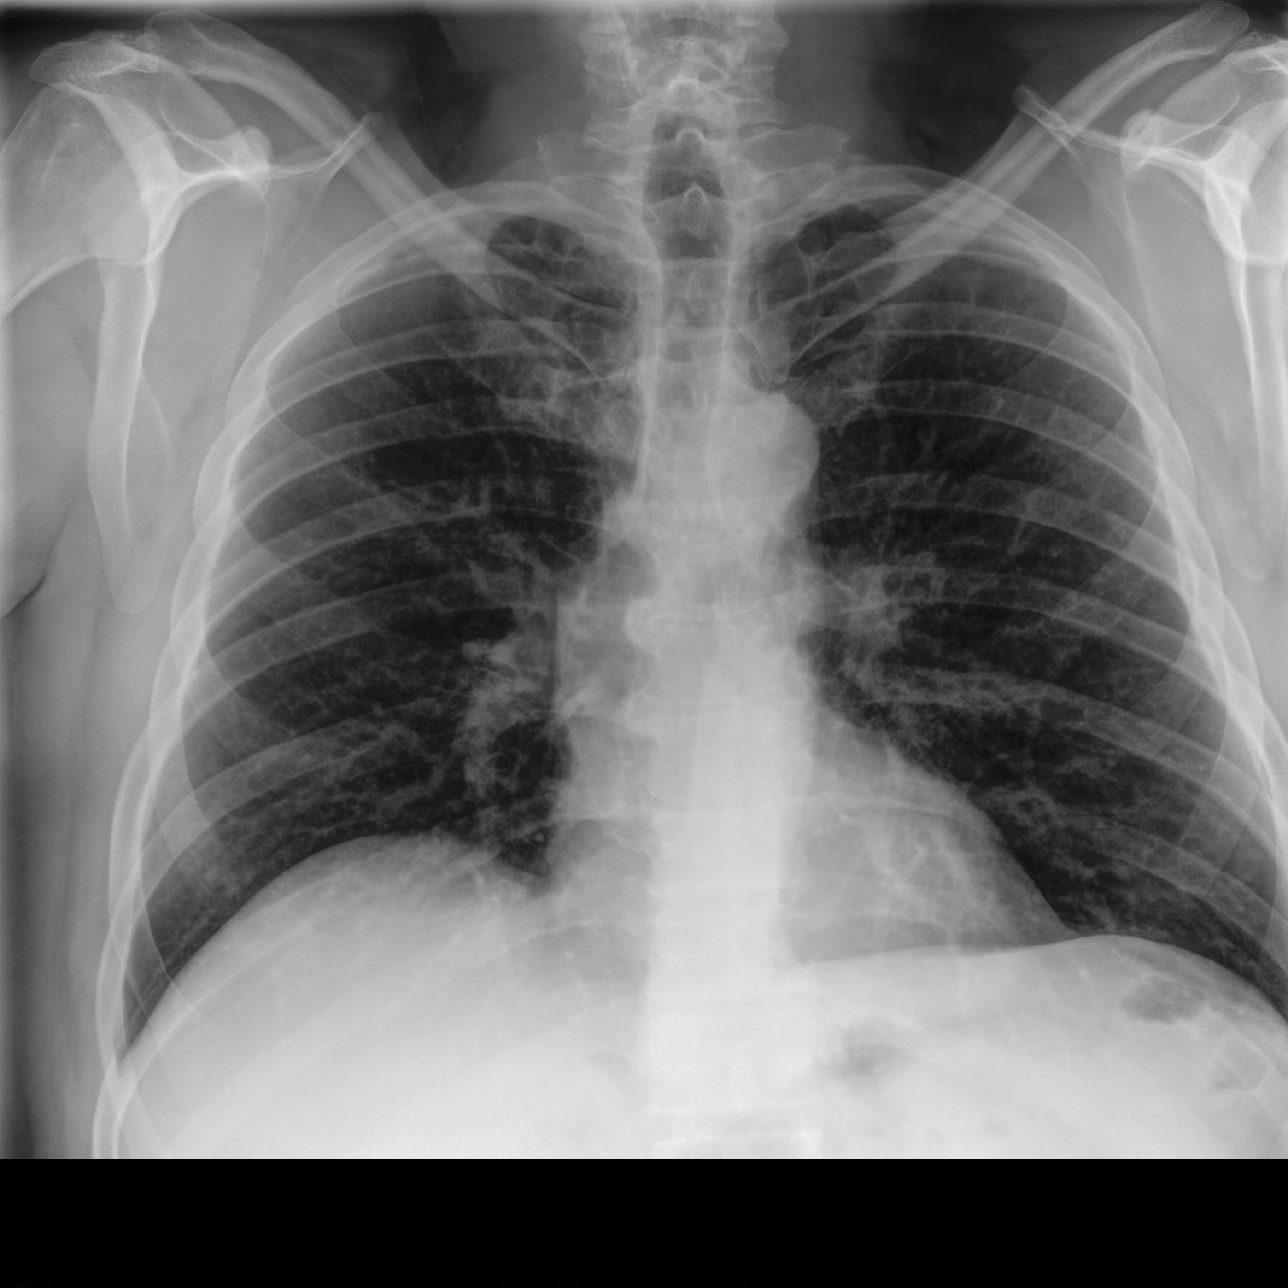

[chest lat]
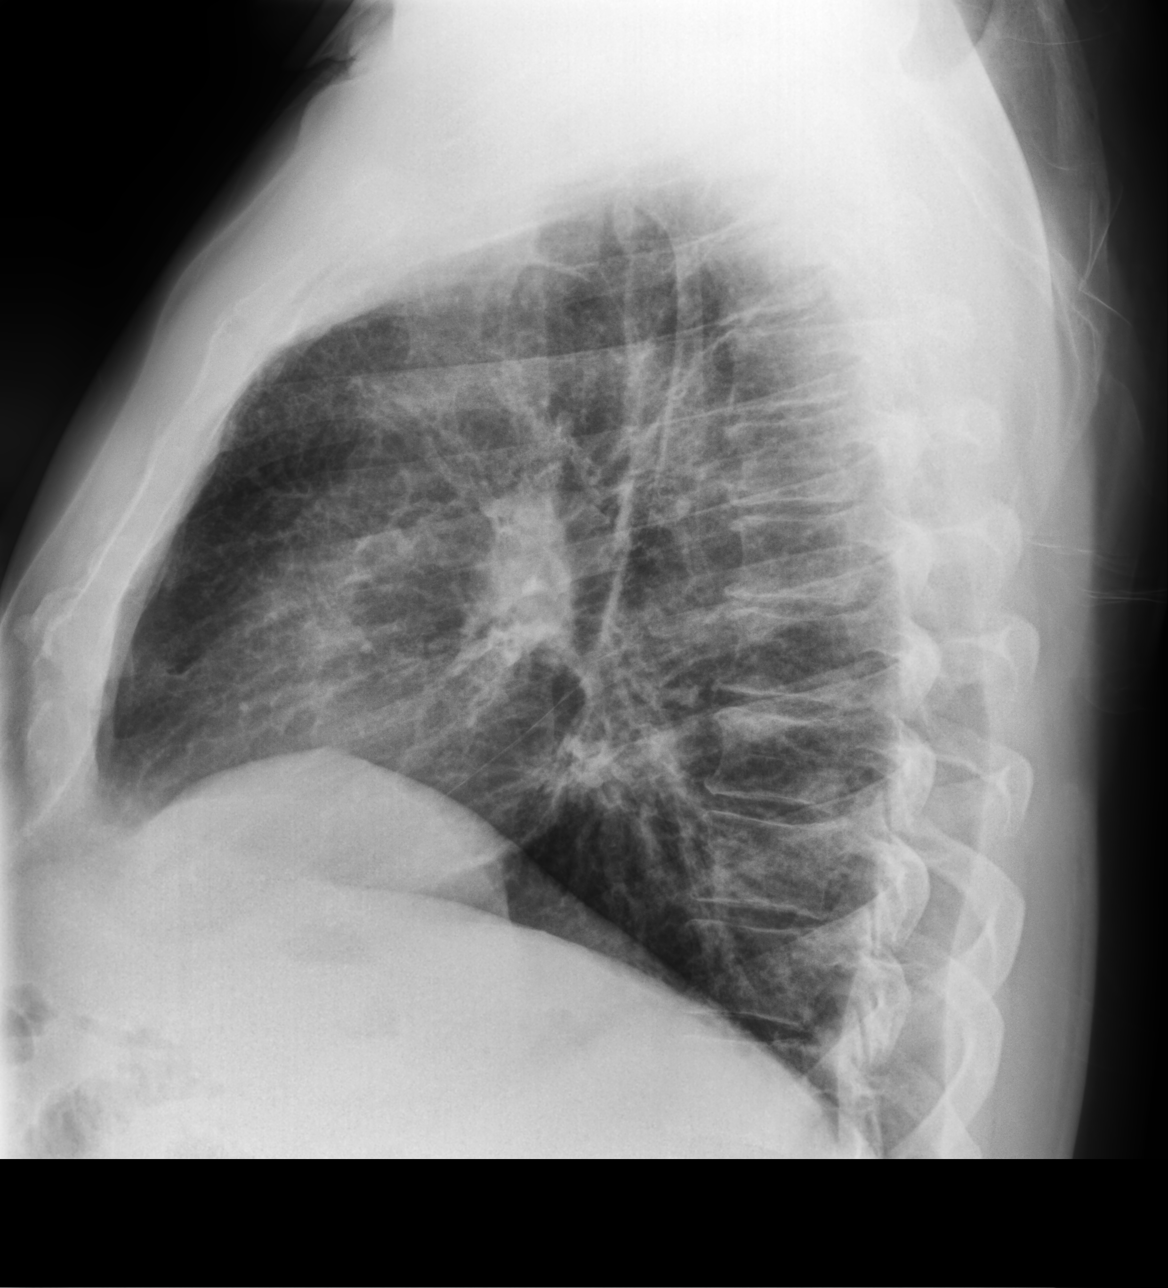

[chest pa (2 of 2)]
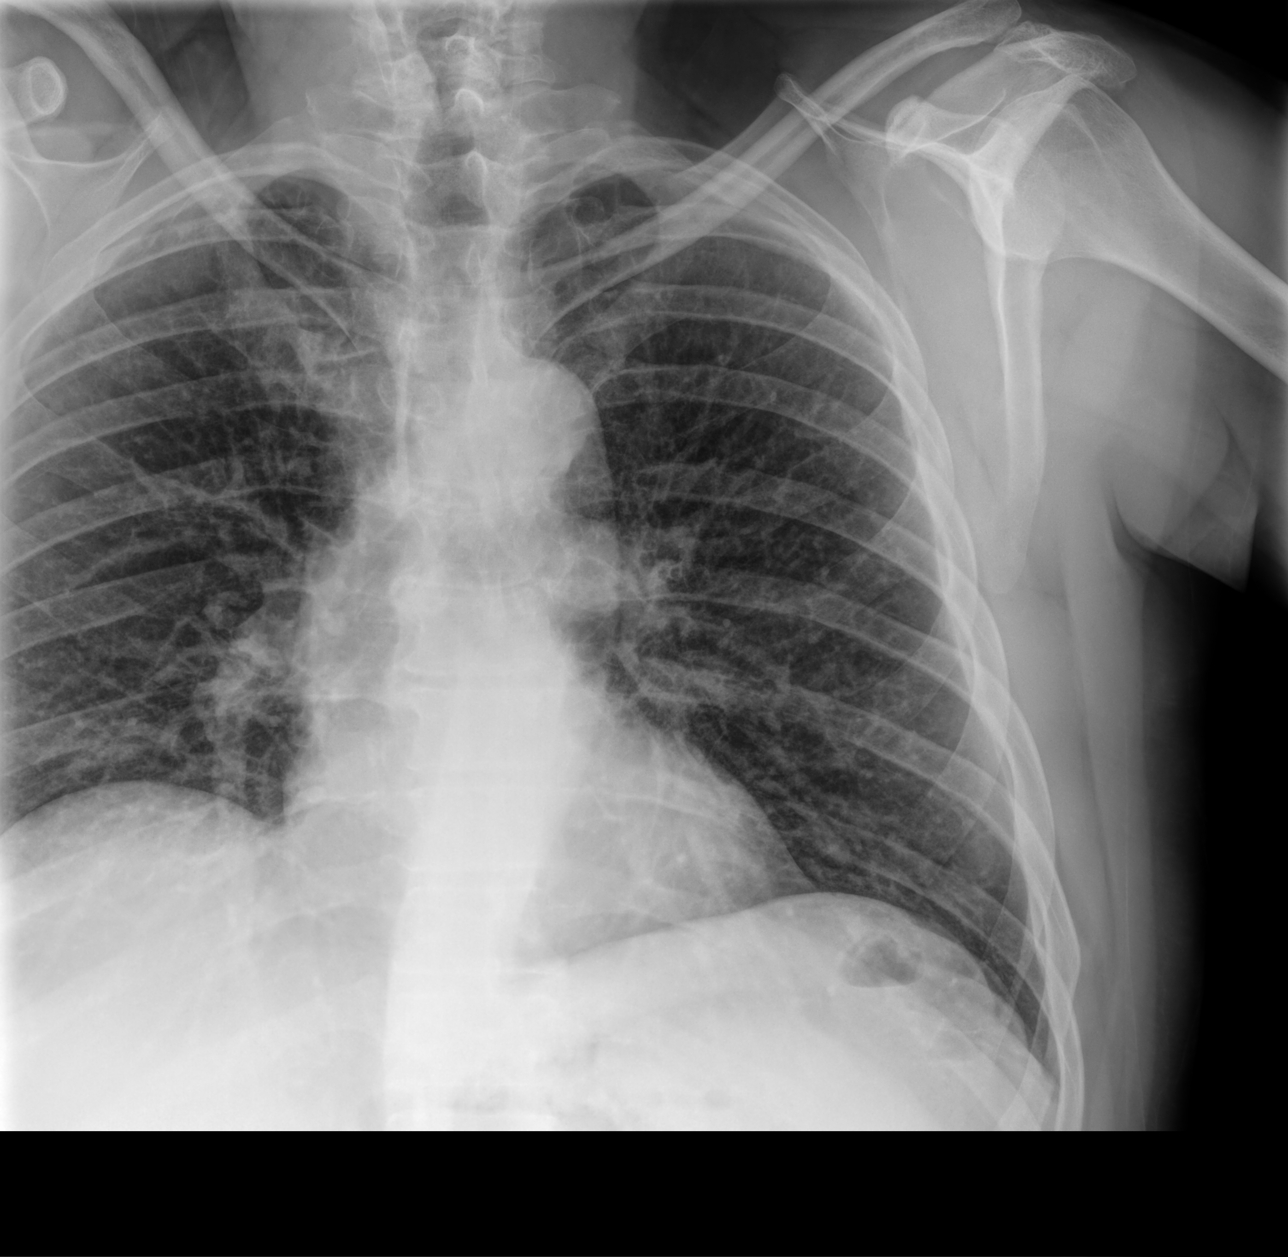

[3 of 3 positions shown; findings below may reference images not displayed]

FINDINGS: Cardiac size is within normal limits. Thoracic aorta is tortuous.
Lung fields are clear of any infiltrates or pulmonary edema. There
is slight prominence of interstitial markings in both lungs. Linear
densities in both apices suggest scarring with no significant
interval change. Blebs and bullae are seen in the apical regions of
both lungs. There is no pleural effusion or pneumothorax.
IMPRESSION: There are no signs of pulmonary edema or focal pulmonary
consolidation. Slight prominence of interstitial markings in both
lungs may suggest interstitial lung disease. Blebs and bullae are
seen in the apices without significant interval change.

## 2022-06-23 ENCOUNTER — Telehealth: Payer: Self-pay | Admitting: *Deleted

## 2022-06-23 NOTE — Telephone Encounter (Signed)
Called and spoke with Chase Christensen. She states that ONO was not done with [facility] and  they need the ONO results to process the order. Will look for ONO results/ reach out to [facility] for help locating order and results and will fax to [phone].

## 2022-06-23 NOTE — Telephone Encounter (Signed)
ONO was done with [facility]. Per Benna Dunks she will resend O2 order to [facility]. Nothing further needed

## 2022-06-24 ENCOUNTER — Telehealth: Payer: Self-pay | Admitting: Pulmonary Disease

## 2022-06-24 ENCOUNTER — Ambulatory Visit: Payer: BC Managed Care – PPO | Admitting: Family

## 2022-06-24 NOTE — Telephone Encounter (Signed)
Printed off last office note and telephone message for patient from Dr Vassie Loll. Faxed to [facility]. Nothing further needed

## 2022-06-24 NOTE — Telephone Encounter (Signed)
[facility] called. They need the face to face notes on this pt to process the order. Please fax.

## 2022-06-25 ENCOUNTER — Telehealth: Payer: Self-pay

## 2022-06-25 DIAGNOSIS — J449 Chronic obstructive pulmonary disease, unspecified: Secondary | ICD-10-CM | POA: Diagnosis not present

## 2022-06-25 NOTE — Telephone Encounter (Signed)
LMOM for Pt to return call for results of chest Xray.

## 2022-06-25 NOTE — Telephone Encounter (Signed)
-----   Message from Oretha Milch, MD sent at 06/18/2022  4:59 PM EST ----- Emphysema as before. Lymph glands are stable compared to 6 months ago

## 2022-06-26 ENCOUNTER — Ambulatory Visit
Admission: EM | Admit: 2022-06-26 | Discharge: 2022-06-26 | Disposition: A | Discharge status: Home / Self Care | Payer: BC Managed Care – PPO | Attending: Family Medicine | Admitting: Family Medicine

## 2022-06-26 ENCOUNTER — Encounter: Payer: Self-pay | Admitting: Emergency Medicine

## 2022-06-26 DIAGNOSIS — N390 Urinary tract infection, site not specified: Secondary | ICD-10-CM | POA: Diagnosis not present

## 2022-06-26 LAB — POCT URINALYSIS DIP (MANUAL ENTRY)
Bilirubin, UA: NEGATIVE
Glucose, UA: NEGATIVE mg/dL
Ketones, POC UA: NEGATIVE mg/dL
Nitrite, UA: POSITIVE — AB
Protein Ur, POC: >=300 mg/dL — AB
Spec Grav, UA: 1.025 (ref 1.010–1.025)
Urobilinogen, UA: 0.2 E.U./dL
pH, UA: 5.5 (ref 5.0–8.0)

## 2022-06-26 MED ORDER — PHENAZOPYRIDINE HCL 200 MG PO TABS: 200.0000 mg | ORAL_TABLET | Freq: Three times a day (TID) | ORAL | 0 refills | Status: DC | PRN

## 2022-06-26 MED ORDER — SULFAMETHOXAZOLE-TRIMETHOPRIM 800-160 MG PO TABS: 1.0000 | ORAL_TABLET | Freq: Two times a day (BID) | ORAL | 0 refills | Status: AC

## 2022-06-26 NOTE — ED Triage Notes (Signed)
Chills on Friday.  Fever on Sunday.  Had a tele-doc visit on Friday and was given steroids and z-pack.  Lower back pain started 3 days ago.  Burning on urination.

## 2022-06-26 NOTE — ED Provider Notes (Signed)
[facility]    CSN: [id number] Arrival date & time: 06/26/22  1649      History   Chief Complaint No chief complaint on file.   HPI Chase Christensen is a 51 y.o. male.   Patient presenting today with several day history of dysuria, flank pain, urinary frequency and urgency.  States he is finishing course of steroids and Z-Pak for a respiratory illness that started last week and worse in the last few days with the symptoms.  Denies nausea, vomiting, bowel changes, hematuria, penile discharge rashes or lesions.  No concern for STDs today.  Has a history of poorly controlled diabetes which has been causing him urinary symptoms for quite some time.  Taking ibuprofen for pain relief as needed.    Past Medical History:  Diagnosis Date   COPD (chronic obstructive pulmonary disease) ([place])    Diabetes ([place]) 09/17/2021   ED (erectile dysfunction)    Gout    Hypertension    Pneumonia    Prediabetes 09/18/2020    Patient Active Problem List   Diagnosis Date Noted   Mediastinal lymphadenopathy 05/07/2022   Type 2 diabetes mellitus with hyperglycemia, without long-term current use of insulin ([place]) 09/17/2021   COPD without exacerbation ([place]) 03/19/2021   Controlled gout 09/13/2020   Gastroesophageal reflux disease 08/27/2017   Erectile dysfunction 06/08/2015   Essential hypertension 05/03/2015   Tobacco use 05/03/2015    Past Surgical History:  Procedure Laterality Date   KNEE ARTHROSCOPY Right 03/15/2015   WISDOM TOOTH EXTRACTION         Home Medications    Prior to Admission medications   Medication Sig Start Date End Date Taking? Authorizing Provider  phenazopyridine (PYRIDIUM) 200 MG tablet Take 1 tablet (200 mg total) by mouth 3 (three) times daily as needed for pain. Please note this medicine will turn your urine orange 06/26/22  Yes Volney American, PA-C  sulfamethoxazole-trimethoprim (BACTRIM DS) 800-160 MG tablet Take 1 tablet by mouth 2 (two)  times daily for 10 days. 06/26/22 07/06/22 Yes Volney American, PA-C  albuterol (VENTOLIN HFA) 108 (90 Base) MCG/ACT inhaler Inhale 2 puffs into the lungs every 6 (six) hours as needed for wheezing or shortness of breath. 09/17/21   Loman Brooklyn, FNP  allopurinol (ZYLOPRIM) 100 MG tablet Take 2 tablets (200 mg total) by mouth daily. 03/26/22   Loman Brooklyn, FNP  amLODipine (NORVASC) 10 MG tablet Take 1 tablet (10 mg total) by mouth daily. 03/26/22   Loman Brooklyn, FNP  colchicine 0.6 MG tablet Take 2 tablets (1.2 mg) by mouth at  onset of gout flare, then 1 tablet (0.6 mg) 1 hour later. 09/17/21   Loman Brooklyn, FNP  ibuprofen (ADVIL,MOTRIN) 800 MG tablet Take 1 tablet (800 mg total) by mouth every 8 (eight) hours as needed. 03/07/18   Hayden Rasmussen, MD  lisinopril (ZESTRIL) 40 MG tablet Take 1 tablet (40 mg total) by mouth daily. 03/26/22   Loman Brooklyn, FNP  metFORMIN (GLUCOPHAGE-XR) 500 MG 24 hr tablet Take 1 tablet (500 mg total) by mouth 2 (two) times daily with a meal. 03/26/22   Loman Brooklyn, FNP  pantoprazole (PROTONIX) 40 MG tablet Take 1 tablet (40 mg total) by mouth daily before breakfast. 03/26/22   Loman Brooklyn, FNP  rosuvastatin (CRESTOR) 5 MG tablet Take 1 tablet (5 mg total) by mouth daily with supper. 03/26/22   Loman Brooklyn, FNP  sildenafil (VIAGRA) 100 MG tablet  Take 1 tablet (100 mg total) by mouth daily as needed for erectile dysfunction. 03/19/21   Gwenlyn Fudge, FNP  Tiotropium Bromide Monohydrate (SPIRIVA RESPIMAT) 2.5 MCG/ACT AERS Inhale 2 puffs into the lungs daily. 03/26/22   Gwenlyn Fudge, FNP    Family History Family History  Problem Relation Age of Onset   Hypertension Mother    Diabetes Maternal Grandmother    Lung cancer Maternal Grandfather    Pancreatic cancer Paternal Grandmother    Colon cancer Paternal Uncle    Colon polyps Cousin    Cancer Other        all paternal uncles and anunts passed with cancer    Social  History Social History   Tobacco Use   Smoking status: Every Day    Packs/day: 0.25    Types: Cigarettes   Smokeless tobacco: Former    Types: Associate Professor Use: Never used  Substance Use Topics   Alcohol use: Yes    Comment: occas   Drug use: No     Allergies   Penicillins   Review of Systems Review of Systems Per HPI  Physical Exam Triage Vital Signs ED Triage Vitals  Enc Vitals Group     BP 06/26/22 1724 132/87     Pulse Rate 06/26/22 1724 86     Resp 06/26/22 1724 18     Temp 06/26/22 1724 98 F (36.7 C)     Temp Source 06/26/22 1724 Oral     SpO2 06/26/22 1724 93 %     Weight --      Height --      Head Circumference --      Peak Flow --      Pain Score 06/26/22 1729 5     Pain Loc --      Pain Edu? --      Excl. in GC? --    No data found.  Updated Vital Signs BP 132/87 (BP Location: Right Arm)   Pulse 86   Temp 98 F (36.7 C) (Oral)   Resp 18   SpO2 93%   Visual Acuity Right Eye Distance:   Left Eye Distance:   Bilateral Distance:    Right Eye Near:   Left Eye Near:    Bilateral Near:     Physical Exam Vitals and nursing note reviewed.  Constitutional:      Appearance: Normal appearance.  HENT:     Head: Atraumatic.     Mouth/Throat:     Mouth: Mucous membranes are moist.  Eyes:     Extraocular Movements: Extraocular movements intact.     Conjunctiva/sclera: Conjunctivae normal.  Cardiovascular:     Rate and Rhythm: Normal rate and regular rhythm.  Pulmonary:     Effort: Pulmonary effort is normal.     Breath sounds: Normal breath sounds.  Abdominal:     General: Bowel sounds are normal. There is no distension.     Palpations: Abdomen is soft.     Tenderness: There is no abdominal tenderness. There is no right CVA tenderness, left CVA tenderness or guarding.  Musculoskeletal:        General: Normal range of motion.     Cervical back: Normal range of motion and neck supple.  Skin:    General: Skin is warm and  dry.  Neurological:     General: No focal deficit present.     Mental Status: He is oriented to person, place, and time.  Psychiatric:        Mood and Affect: Mood normal.        Thought Content: Thought content normal.        Judgment: Judgment normal.      UC Treatments / Results  Labs (all labs ordered are listed, but only abnormal results are displayed) Labs Reviewed  POCT URINALYSIS DIP (MANUAL ENTRY) - Abnormal; Notable for the following components:      Result Value   Blood, UA moderate (*)    Protein Ur, POC >=300 (*)    Nitrite, UA Positive (*)    Leukocytes, UA Small (1+) (*)    All other components within normal limits  URINE CULTURE    EKG   Radiology No results found.  Procedures Procedures (including critical care time)  Medications Ordered in UC Medications - No data to display  Initial Impression / Assessment and Plan / UC Course  I have reviewed the triage vital signs and the nursing notes.  Pertinent labs & imaging results that were available during my care of the patient were reviewed by me and considered in my medical decision making (see chart for details).     Urinalysis today consistent with urinary tract infection.  Treat with Bactrim, Pyridium, fluids, good blood sugar control and await urine culture, adjust if needed.  Return for worsening symptoms.  Final Clinical Impressions(s) / UC Diagnoses   Final diagnoses:  Acute lower UTI   Discharge Instructions   None    ED Prescriptions     Medication Sig Dispense Auth. Provider   sulfamethoxazole-trimethoprim (BACTRIM DS) 800-160 MG tablet Take 1 tablet by mouth 2 (two) times daily for 10 days. 20 tablet Volney American, [place]   phenazopyridine (PYRIDIUM) 200 MG tablet Take 1 tablet (200 mg total) by mouth 3 (three) times daily as needed for pain. Please note this medicine will turn your urine orange 6 tablet Volney American, [place]      PDMP not reviewed this  encounter.   Volney American, [place] 06/26/22 1821

## 2022-06-29 LAB — URINE CULTURE: Culture: >=100000 — AB

## 2022-07-18 ENCOUNTER — Telehealth: Payer: Self-pay | Admitting: Orthopedic Surgery

## 2022-07-18 NOTE — Telephone Encounter (Signed)
Called patient per mychart message to schedule an appointment with Dr. Sharol Given or Junie Panning for pain in his shoulder and back

## 2022-07-25 ENCOUNTER — Encounter: Payer: Self-pay | Admitting: Family

## 2022-07-25 ENCOUNTER — Ambulatory Visit (INDEPENDENT_AMBULATORY_CARE_PROVIDER_SITE_OTHER): Payer: BC Managed Care – PPO

## 2022-07-25 ENCOUNTER — Telehealth: Payer: Self-pay | Admitting: Family

## 2022-07-25 ENCOUNTER — Ambulatory Visit: Payer: BC Managed Care – PPO | Admitting: Family

## 2022-07-25 DIAGNOSIS — M542 Cervicalgia: Secondary | ICD-10-CM

## 2022-07-25 DIAGNOSIS — M79601 Pain in right arm: Secondary | ICD-10-CM

## 2022-07-25 DIAGNOSIS — M545 Low back pain, unspecified: Secondary | ICD-10-CM | POA: Diagnosis not present

## 2022-07-25 DIAGNOSIS — G8929 Other chronic pain: Secondary | ICD-10-CM

## 2022-07-25 MED ORDER — HYDROCODONE-ACETAMINOPHEN 5-325 MG PO TABS: 1.0000 | ORAL_TABLET | Freq: Four times a day (QID) | ORAL | 0 refills | Status: DC | PRN

## 2022-07-25 NOTE — Telephone Encounter (Signed)
Patient called in stating he was supposed to get a Rx he said Junie Panning stated a steroid and a pain med for him sent to the [facility] in [place]

## 2022-07-25 NOTE — Progress Notes (Signed)
Office Visit Note   Patient: Chase Christensen           Date of Birth: [birth year]           MRN: [id number] Visit Date: 07/25/2022              Requested by: Sonny Masters, FNP [address] [place],  [place] [address] PCP: Sonny Masters, FNP  Chief Complaint  Patient presents with   Neck - Pain   Right Shoulder - Pain   Lower Back - Pain      HPI: The patient is a 52 year old gentleman seen today for concern of return of some right shoulder pain this radiates from his neck and down his upper arm.  He has had a prior episode of the same which did eventually subside this time and has been persistent.  He is concerned about some weakness that has been gradually worsening in his right hand as well describes the pain as nerve pain  Also complaining of some low back pain  Assessment & Plan: Visit Diagnoses:  1. Right arm pain   2. Chronic bilateral low back pain, unspecified whether sciatica present     Plan: offered low back exercises and stretching for home exercise program. Declined PT referral. Trial a prednisone burst for the cervical radiculopathy.   Referred to spine.   Muscle strain - low back  Follow-Up Instructions: No follow-ups on file.   Back Exam   Tenderness  The patient is experiencing no tenderness.   Muscle Strength  The patient has normal back strength.  Tests  Straight leg raise right: negative Straight leg raise left: negative  Comments:  Negative spurling   Right Shoulder Exam  Right shoulder exam is normal.  Tenderness  The patient is experiencing no tenderness.  Range of Motion  The patient has normal right shoulder ROM.  Muscle Strength  The patient has normal right shoulder strength.  Tests  Impingement: negative Drop arm: negative  Other  Erythema: absent      Patient is alert, oriented, no adenopathy, well-dressed, normal affect, normal respiratory effort.   Imaging: No results found. No images are attached to  the encounter.  Labs: Lab Results  Component Value Date   HGBA1C 6.7 (H) 03/26/2022   HGBA1C 9.3 (H) 12/27/2021   HGBA1C 7.0 (H) 09/17/2021   LABURIC 6.8 10/13/2019   LABURIC 5.7 03/08/2019   LABURIC 9.2 (H) 03/02/2018   REPTSTATUS 06/29/2022 FINAL 06/26/2022   CULT >=100,000 COLONIES/mL ESCHERICHIA COLI (A) 06/26/2022   LABORGA ESCHERICHIA COLI (A) 06/26/2022     Lab Results  Component Value Date   ALBUMIN 4.0 (L) 03/26/2022   ALBUMIN 4.5 09/17/2021   ALBUMIN 4.2 03/19/2021    No results found for: "MG" No results found for: "VD25OH"  No results found for: "PREALBUMIN"    Latest Ref Rng & Units 03/26/2022    9:14 AM 12/24/2021    2:40 AM 12/23/2021   11:28 PM  CBC EXTENDED  WBC 3.4 - 10.8 x10E3/uL 9.8   11.3   RBC 4.14 - 5.80 x10E6/uL 5.00   5.07   Hemoglobin 13.0 - 17.7 g/dL 19.1  47.8  29.5   HCT 37.5 - 51.0 % 47.1  43.0  45.9   Platelets 150 - 450 x10E3/uL 204   216   NEUT# 1.4 - 7.0 x10E3/uL 6.8     Lymph# 0.7 - 3.1 x10E3/uL 2.0        There is no height  or weight on file to calculate BMI.  Orders:  Orders Placed This Encounter  Procedures   XR Cervical Spine 2 or 3 views   XR Lumbar Spine 2-3 Views   XR Shoulder Right   No orders of the defined types were placed in this encounter.    Procedures: No procedures performed  Clinical Data: No additional findings.  ROS:  All other systems negative, except as noted in the HPI. Review of Systems  Objective: Vital Signs: There were no vitals taken for this visit.  Specialty Comments:  No specialty comments available.  PMFS History: Patient Active Problem List   Diagnosis Date Noted   Mediastinal lymphadenopathy 05/07/2022   Type 2 diabetes mellitus with hyperglycemia, without long-term current use of insulin (HCC) 09/17/2021   COPD without exacerbation (HCC) 03/19/2021   Controlled gout 09/13/2020   Gastroesophageal reflux disease 08/27/2017   Erectile dysfunction 06/08/2015   Essential  hypertension 05/03/2015   Tobacco use 05/03/2015   Past Medical History:  Diagnosis Date   COPD (chronic obstructive pulmonary disease) (HCC)    Diabetes (HCC) 09/17/2021   ED (erectile dysfunction)    Gout    Hypertension    Pneumonia    Prediabetes 09/18/2020    Family History  Problem Relation Age of Onset   Hypertension Mother    Diabetes Maternal Grandmother    Lung cancer Maternal Grandfather    Pancreatic cancer Paternal Grandmother    Colon cancer Paternal Uncle    Colon polyps Cousin    Cancer Other        all paternal uncles and anunts passed with cancer    Past Surgical History:  Procedure Laterality Date   KNEE ARTHROSCOPY Right 03/15/2015   WISDOM TOOTH EXTRACTION     Social History   Occupational History   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

## 2022-07-26 DIAGNOSIS — J449 Chronic obstructive pulmonary disease, unspecified: Secondary | ICD-10-CM | POA: Diagnosis not present

## 2022-07-29 ENCOUNTER — Encounter: Payer: Self-pay | Admitting: Family

## 2022-07-29 ENCOUNTER — Telehealth: Payer: Self-pay | Admitting: Family

## 2022-07-29 NOTE — Telephone Encounter (Signed)
Pt was in the office 07/25/2022 and states that he was supposed to get rx for prednisone. I see in dictation can you please write rx? Thanks.

## 2022-07-29 NOTE — Telephone Encounter (Signed)
Patient states he got his pain meds but did not receive a rx for the steroids please advise

## 2022-07-29 NOTE — Patient Instructions (Signed)
Low Back Sprain or Strain Rehab Ask your health care provider which exercises are safe for you. Do exercises exactly as told by your health care provider and adjust them as directed. It is normal to feel mild stretching, pulling, tightness, or discomfort as you do these exercises. Stop right away if you feel sudden pain or your pain gets worse. Do not begin these exercises until told by your health care provider. Stretching and range-of-motion exercises These exercises warm up your muscles and joints and improve the movement and flexibility of your back. These exercises also help to relieve pain, numbness, and tingling. Lumbar rotation  Lie on your back on a firm bed or the floor with your knees bent. Straighten your arms out to your sides so each arm forms a 90-degree angle (right angle) with a side of your body. Slowly move (rotate) both of your knees to one side of your body until you feel a stretch in your lower back (lumbar). Try not to let your shoulders lift off the floor. Hold this position for __________ seconds. Tense your abdominal muscles and slowly move your knees back to the starting position. Repeat this exercise on the other side of your body. Repeat __________ times. Complete this exercise __________ times a day. Single knee to chest  Lie on your back on a firm bed or the floor with both legs straight. Bend one of your knees. Use your hands to move your knee up toward your chest until you feel a gentle stretch in your lower back and buttock. Hold your leg in this position by holding on to the front of your knee. Keep your other leg as straight as possible. Hold this position for __________ seconds. Slowly return to the starting position. Repeat with your other leg. Repeat __________ times. Complete this exercise __________ times a day. Prone extension on elbows  Lie on your abdomen on a firm bed or the floor (prone position). Prop yourself up on your elbows. Use your arms  to help lift your chest up until you feel a gentle stretch in your abdomen and your lower back. This will place some of your body weight on your elbows. If this is uncomfortable, try stacking pillows under your chest. Your hips should stay down, against the surface that you are lying on. Keep your hip and back muscles relaxed. Hold this position for __________ seconds. Slowly relax your upper body and return to the starting position. Repeat __________ times. Complete this exercise __________ times a day. Strengthening exercises These exercises build strength and endurance in your back. Endurance is the ability to use your muscles for a long time, even after they get tired. Pelvic tilt This exercise strengthens the muscles that lie deep in the abdomen. Lie on your back on a firm bed or the floor with your legs extended. Bend your knees so they are pointing toward the ceiling and your feet are flat on the floor. Tighten your lower abdominal muscles to press your lower back against the floor. This motion will tilt your pelvis so your tailbone points up toward the ceiling instead of pointing to your feet or the floor. To help with this exercise, you may place a small towel under your lower back and try to push your back into the towel. Hold this position for __________ seconds. Let your muscles relax completely before you repeat this exercise. Repeat __________ times. Complete this exercise __________ times a day. Alternating arm and leg raises  Get on your hands   and knees on a firm surface. If you are on a hard floor, you may want to use padding, such as an exercise mat, to cushion your knees. Line up your arms and legs. Your hands should be directly below your shoulders, and your knees should be directly below your hips. Lift your left leg behind you. At the same time, raise your right arm and straighten it in front of you. Do not lift your leg higher than your hip. Do not lift your arm higher  than your shoulder. Keep your abdominal and back muscles tight. Keep your hips facing the ground. Do not arch your back. Keep your balance carefully, and do not hold your breath. Hold this position for __________ seconds. Slowly return to the starting position. Repeat with your right leg and your left arm. Repeat __________ times. Complete this exercise __________ times a day. Abdominal set with straight leg raise  Lie on your back on a firm bed or the floor. Bend one of your knees and keep your other leg straight. Tense your abdominal muscles and lift your straight leg up, 4-6 inches (10-15 cm) off the ground. Keep your abdominal muscles tight and hold this position for __________ seconds. Do not hold your breath. Do not arch your back. Keep it flat against the ground. Keep your abdominal muscles tense as you slowly lower your leg back to the starting position. Repeat with your other leg. Repeat __________ times. Complete this exercise __________ times a day. Single leg lower with bent knees Lie on your back on a firm bed or the floor. Tense your abdominal muscles and lift your feet off the floor, one foot at a time, so your knees and hips are bent in 90-degree angles (right angles). Your knees should be over your hips and your lower legs should be parallel to the floor. Keeping your abdominal muscles tense and your knee bent, slowly lower one of your legs so your toe touches the ground. Lift your leg back up to return to the starting position. Do not hold your breath. Do not let your back arch. Keep your back flat against the ground. Repeat with your other leg. Repeat __________ times. Complete this exercise __________ times a day. Posture and body mechanics Good posture and healthy body mechanics can help to relieve stress in your body's tissues and joints. Body mechanics refers to the movements and positions of your body while you do your daily activities. Posture is part of body  mechanics. Good posture means: Your spine is in its natural S-curve position (neutral). Your shoulders are pulled back slightly. Your head is not tipped forward (neutral). Follow these guidelines to improve your posture and body mechanics in your everyday activities. Standing  When standing, keep your spine neutral and your feet about hip-width apart. Keep a slight bend in your knees. Your ears, shoulders, and hips should line up. When you do a task in which you stand in one place for a long time, place one foot up on a stable object that is 2-4 inches (5-10 cm) high, such as a footstool. This helps keep your spine neutral. Sitting  When sitting, keep your spine neutral and keep your feet flat on the floor. Use a footrest, if necessary, and keep your thighs parallel to the floor. Avoid rounding your shoulders, and avoid tilting your head forward. When working at a desk or a computer, keep your desk at a height where your hands are slightly lower than your elbows. Slide your   chair under your desk so you are close enough to maintain good posture. When working at a computer, place your monitor at a height where you are looking straight ahead and you do not have to tilt your head forward or downward to look at the screen. Resting When lying down and resting, avoid positions that are most painful for you. If you have pain with activities such as sitting, bending, stooping, or squatting, lie in a position in which your body does not bend very much. For example, avoid curling up on your side with your arms and knees near your chest (fetal position). If you have pain with activities such as standing for a long time or reaching with your arms, lie with your spine in a neutral position and bend your knees slightly. Try the following positions: Lying on your side with a pillow between your knees. Lying on your back with a pillow under your knees. Lifting  When lifting objects, keep your feet at least  shoulder-width apart and tighten your abdominal muscles. Bend your knees and hips and keep your spine neutral. It is important to lift using the strength of your legs, not your back. Do not lock your knees straight out. Always ask for help to lift heavy or awkward objects. This information is not intended to replace advice given to you by your health care provider. Make sure you discuss any questions you have with your health care provider. Document Revised: 09/10/2020 Document Reviewed: 09/10/2020 Elsevier Patient Education  [place].

## 2022-07-30 ENCOUNTER — Ambulatory Visit: Payer: BC Managed Care – PPO | Admitting: Family Medicine

## 2022-07-30 ENCOUNTER — Encounter: Payer: Self-pay | Admitting: Family Medicine

## 2022-07-30 VITALS — BP 126/78 | HR 89 | Temp 98.5°F | Ht 75.0 in | Wt 251.0 lb

## 2022-07-30 DIAGNOSIS — R300 Dysuria: Secondary | ICD-10-CM | POA: Diagnosis not present

## 2022-07-30 DIAGNOSIS — R6889 Other general symptoms and signs: Secondary | ICD-10-CM

## 2022-07-30 DIAGNOSIS — N410 Acute prostatitis: Secondary | ICD-10-CM

## 2022-07-30 LAB — MICROSCOPIC EXAMINATION
Epithelial Cells (non renal): NONE SEEN /hpf (ref 0–10)
RBC, Urine: NONE SEEN /hpf (ref 0–2)
Renal Epithel, UA: NONE SEEN /hpf

## 2022-07-30 LAB — URINALYSIS, ROUTINE W REFLEX MICROSCOPIC
Bilirubin, UA: NEGATIVE
Glucose, UA: NEGATIVE
Ketones, UA: NEGATIVE
Leukocytes,UA: NEGATIVE
Nitrite, UA: NEGATIVE
Protein,UA: NEGATIVE
Specific Gravity, UA: 1.025 (ref 1.005–1.030)
Urobilinogen, Ur: 0.2 mg/dL (ref 0.2–1.0)
pH, UA: 5.5 (ref 5.0–7.5)

## 2022-07-30 LAB — VERITOR FLU A/B WAIVED
Influenza A: NEGATIVE
Influenza B: NEGATIVE

## 2022-07-30 MED ORDER — CIPROFLOXACIN HCL 500 MG PO TABS: 500.0000 mg | ORAL_TABLET | Freq: Two times a day (BID) | ORAL | 0 refills | Status: AC

## 2022-07-30 MED ORDER — PREDNISONE 50 MG PO TABS: ORAL_TABLET | ORAL | 0 refills | Status: DC

## 2022-07-30 NOTE — Progress Notes (Signed)
Subjective:  Patient ID: [name], male    DOB: 06-05-1971, 52 y.o.   MRN: [id number]  Patient Care Team: Sonny Masters, FNP as PCP - General (Family Medicine)   Chief Complaint:  Nausea, Diarrhea, and Fatigue (X 2 1/2 days )   HPI: DEVARIO TISON is a 52 y.o. male presenting on 07/30/2022 for Nausea, Diarrhea, and Fatigue (X 2 1/2 days )   Diarrhea  This is a new problem. The current episode started yesterday. The problem occurs 2 to 4 times per day. The problem has been unchanged. The stool consistency is described as Watery. The patient states that diarrhea does not awaken him from sleep. Associated symptoms include abdominal pain, chills, a fever, myalgias and a URI. Pertinent negatives include no arthralgias, bloating, coughing, headaches, increased  flatus, sweats, vomiting or weight loss. Nothing aggravates the symptoms. There are no known risk factors.  Fever  This is a new problem. The current episode started in the past 7 days. The problem has been waxing and waning. The maximum temperature noted was 101 to 101.9 F. The temperature was taken using an oral thermometer. Associated symptoms include abdominal pain, congestion, diarrhea, muscle aches, nausea and urinary pain. Pertinent negatives include no chest pain, coughing, ear pain, headaches, rash, sleepiness, sore throat, vomiting or wheezing. He has tried acetaminophen for the symptoms. The treatment provided mild relief.  Dysuria  This is a new problem. The current episode started in the past 7 days. The problem has been waxing and waning. The quality of the pain is described as burning and aching. The pain is moderate. The maximum temperature recorded prior to his arrival was 101 - 101.9 F. Associated symptoms include chills, flank pain and nausea. Pertinent negatives include no discharge, frequency, hematuria, hesitancy, possible pregnancy, sweats, urgency or vomiting. He has tried nothing for the symptoms. The treatment  provided no relief.      Relevant past medical, surgical, family, and social history reviewed and updated as indicated.  Allergies and medications reviewed and updated. Data reviewed: Chart in Epic.   Past Medical History:  Diagnosis Date   COPD (chronic obstructive pulmonary disease) (HCC)    Diabetes (HCC) 09/17/2021   ED (erectile dysfunction)    Gout    Hypertension    Pneumonia    Prediabetes 09/18/2020    Past Surgical History:  Procedure Laterality Date   KNEE ARTHROSCOPY Right 03/15/2015   WISDOM TOOTH EXTRACTION      Social History   Socioeconomic History   Marital status: Divorced    Spouse name: Not on file   Number of children: 1   Years of education: Not on file   Highest education level: Not on file  Occupational History   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  Other Topics Concern   Not on file  Social History Narrative   Not on file   Social Determinants of Health   Financial Resource Strain: Not on file  Food Insecurity: Not on file  Transportation Needs: Not on file  Physical Activity: Not on file  Stress: Not on file  Social Connections: Not on file  Intimate Partner Violence: Not on file    Outpatient Encounter Medications as of 07/30/2022  Medication Sig   albuterol (VENTOLIN HFA) 108 (90 Base) MCG/ACT inhaler Inhale 2 puffs into the lungs every 6 (six) hours as needed for wheezing or shortness of breath.   allopurinol (ZYLOPRIM) 100 MG tablet Take 2 tablets (200 mg total) by mouth daily.   amLODipine (NORVASC) 10 MG tablet Take 1 tablet (10 mg total) by mouth daily.   ciprofloxacin (CIPRO) 500 MG tablet Take 1 tablet  (500 mg total) by mouth 2 (two) times daily for 28 days.   colchicine 0.6 MG tablet Take 2 tablets (1.2 mg) by mouth at  onset of gout flare, then 1 tablet (0.6 mg) 1 hour later.   HYDROcodone-acetaminophen (NORCO) 5-325 MG tablet Take 1 tablet by mouth every 6 (six) hours as needed.   lisinopril (ZESTRIL) 40 MG tablet Take 1 tablet (40 mg total) by mouth daily.   metFORMIN (GLUCOPHAGE-XR) 500 MG 24 hr tablet Take 1 tablet (500 mg total) by mouth 2 (two) times daily with a meal.   pantoprazole (PROTONIX) 40 MG tablet Take 1 tablet (40 mg total) by mouth daily before breakfast.   phenazopyridine (PYRIDIUM) 200 MG tablet Take 1 tablet (200 mg total) by mouth 3 (three) times daily as needed for pain. Please note this medicine will turn your urine orange   rosuvastatin (CRESTOR) 5 MG tablet Take 1 tablet (5 mg total) by mouth daily with supper.   sildenafil (VIAGRA) 100 MG tablet Take 1 tablet (100 mg total) by mouth daily as needed for erectile dysfunction.   Tiotropium Bromide Monohydrate (SPIRIVA RESPIMAT) 2.5 MCG/ACT AERS Inhale 2 puffs into the lungs daily.   [DISCONTINUED] ibuprofen (ADVIL,MOTRIN) 800 MG tablet Take 1 tablet (800 mg total) by mouth every 8 (eight) hours as needed.   No facility-administered encounter medications on file as of 07/30/2022.    Allergies  Allergen Reactions   Penicillins Rash    Review of Systems  Constitutional:  Positive for activity change, appetite change, chills, fatigue and fever. Negative for diaphoresis, unexpected weight change and weight loss.  HENT:  Positive for congestion. Negative for ear pain and sore throat.   Eyes:  Negative for photophobia and visual disturbance.  Respiratory:  Negative for cough and wheezing.   Cardiovascular:  Negative for chest pain, palpitations and leg swelling.  Gastrointestinal:  Positive for abdominal pain, diarrhea and nausea. Negative for abdominal distention, anal bleeding, bloating, blood in stool, constipation,  flatus, rectal pain and vomiting.       Rectal pressure  Genitourinary:  Positive for dysuria and flank pain. Negative for decreased urine volume, difficulty urinating, enuresis, frequency, genital sores, hematuria, hesitancy, penile discharge, penile pain, penile swelling, scrotal swelling, testicular pain and urgency.       Scrotal pressure, dark colored urine  Musculoskeletal:  Positive for back pain and myalgias. Negative for arthralgias.  Skin:  Negative for rash.  Neurological:  Negative for dizziness, tremors, seizures, syncope, facial asymmetry, speech difficulty, weakness, light-headedness, numbness and headaches.  Psychiatric/Behavioral:  Negative for confusion.   All other systems reviewed and are negative.       Objective:  BP 126/78   Pulse 89   Temp 98.5 F (36.9 C) (Temporal)   Ht 6\' 3"  (1.905 m)   Wt 251 lb (113.9 kg)   SpO2 93%   BMI 31.37 kg/m    Wt Readings from  Last 3 Encounters:  07/30/22 251 lb (113.9 kg)  05/07/22 256 lb 9.6 oz (116.4 kg)  03/26/22 255 lb (115.7 kg)    Physical Exam Vitals and nursing note reviewed.  Constitutional:      General: He is not in acute distress.    Appearance: Normal appearance. He is well-developed and well-groomed. He is ill-appearing. He is not toxic-appearing or diaphoretic.  HENT:     Head: Normocephalic and atraumatic.     Jaw: There is normal jaw occlusion.     Right Ear: Hearing, tympanic membrane, ear canal and external ear normal.     Left Ear: Hearing, tympanic membrane, ear canal and external ear normal.     Nose: Nose normal.     Mouth/Throat:     Lips: Pink.     Mouth: Mucous membranes are moist.     Pharynx: Oropharynx is clear. Uvula midline.  Eyes:     General: Lids are normal.     Extraocular Movements: Extraocular movements intact.     Conjunctiva/sclera: Conjunctivae normal.     Pupils: Pupils are equal, round, and reactive to light.  Neck:     Thyroid: No thyroid mass, thyromegaly or thyroid  tenderness.     Vascular: No carotid bruit or JVD.     Trachea: Trachea and phonation normal.  Cardiovascular:     Rate and Rhythm: Normal rate and regular rhythm.     Chest Wall: PMI is not displaced.     Pulses: Normal pulses.     Heart sounds: Normal heart sounds. No murmur heard.    No friction rub. No gallop.  Pulmonary:     Effort: Pulmonary effort is normal. No respiratory distress.     Breath sounds: Normal breath sounds. No wheezing.  Abdominal:     General: Bowel sounds are normal. There is no distension or abdominal bruit.     Palpations: Abdomen is soft. There is no hepatomegaly or splenomegaly.     Tenderness: There is no abdominal tenderness. There is no right CVA tenderness or left CVA tenderness.     Hernia: No hernia is present.  Musculoskeletal:        General: Normal range of motion.     Cervical back: Normal range of motion and neck supple.     Right lower leg: No edema.     Left lower leg: No edema.  Lymphadenopathy:     Cervical: No cervical adenopathy.  Skin:    General: Skin is warm and dry.     Capillary Refill: Capillary refill takes less than 2 seconds.     Coloration: Skin is not cyanotic, jaundiced or pale.     Findings: No rash.  Neurological:     General: No focal deficit present.     Mental Status: He is alert and oriented to person, place, and time.     Sensory: Sensation is intact.     Motor: Motor function is intact.     Coordination: Coordination is intact.     Gait: Gait is intact.     Deep Tendon Reflexes: Reflexes are normal and symmetric.  Psychiatric:        Attention and Perception: Attention and perception normal.        Mood and Affect: Mood and affect normal.        Speech: Speech normal.        Behavior: Behavior normal. Behavior is cooperative.        Thought Content: Thought content normal.  Cognition and Memory: Cognition and memory normal.        Judgment: Judgment normal.     Results for orders placed or  performed during the hospital encounter of 06/26/22  Urine Culture   Specimen: Urine, Clean Catch  Result Value Ref Range   Specimen Description      URINE, CLEAN CATCH Performed at [facility], [address] [place], [place] [address]    Special Requests      NONE Performed at [facility], [address], [place], [place] [address]    Culture >=100,000 COLONIES/mL ESCHERICHIA COLI (A)    Report Status 06/29/2022 FINAL    Organism ID, Bacteria ESCHERICHIA COLI (A)       Susceptibility   Escherichia coli - MIC*    AMPICILLIN 8 SENSITIVE Sensitive     CEFAZOLIN <=4 SENSITIVE Sensitive     CEFEPIME <=0.12 SENSITIVE Sensitive     CEFTRIAXONE <=0.25 SENSITIVE Sensitive     CIPROFLOXACIN <=0.25 SENSITIVE Sensitive     GENTAMICIN <=1 SENSITIVE Sensitive     IMIPENEM <=0.25 SENSITIVE Sensitive     NITROFURANTOIN <=16 SENSITIVE Sensitive     TRIMETH/SULFA <=20 SENSITIVE Sensitive     AMPICILLIN/SULBACTAM 4 SENSITIVE Sensitive     PIP/TAZO <=4 SENSITIVE Sensitive     * >=100,000 COLONIES/mL ESCHERICHIA COLI  POCT urinalysis dipstick  Result Value Ref Range   Color, UA yellow yellow   Clarity, UA clear clear   Glucose, UA negative negative mg/dL   Bilirubin, UA negative negative   Ketones, POC UA negative negative mg/dL   Spec Grav, UA 5.284 1.324 - 1.025   Blood, UA moderate (A) negative   pH, UA 5.5 5.0 - 8.0   Protein Ur, POC >=300 (A) negative mg/dL   Urobilinogen, UA 0.2 0.2 or 1.0 E.U./dL   Nitrite, UA Positive (A) Negative   Leukocytes, UA Small (1+) (A) Negative     Influenza negative. COVID negative at home.  Urinalysis positive for blood, otherwise unremarkable.   Pertinent labs & imaging results that were available during my care of the patient were reviewed by me and considered in my medical decision making.  Assessment & Plan:  Deondrae was seen today for nausea, diarrhea and fatigue.  Diagnoses and all orders for this visit:  Flu-like symptoms Dysuria Acute  prostatitis  Influenza negative. Systemic and local sings concerning for acute prostatitis as urine was positive for blood only. Will treat with below. Pt aware of red flags which require emergent evaluation and treatment. Will repeat urinalysis at next visit. Report new, worsening, or persistent symptoms.  Influenza negative in office.  -     Veritor Flu A/B Waived -     Urinalysis, Routine w reflex microscopic -     Urine Culture -     ciprofloxacin (CIPRO) 500 MG tablet; Take 1 tablet (500 mg total) by mouth 2 (two) times daily for 28 days.     Continue all other maintenance medications.  Follow up plan: Return if symptoms worsen or fail to improve.   Continue healthy lifestyle choices, including diet (rich in fruits, vegetables, and lean proteins, and low in salt and simple carbohydrates) and exercise (at least 30 minutes of moderate physical activity daily).  Educational handout given for prostatitis  The above assessment and management plan was discussed with the patient. The patient verbalized understanding of and has agreed to the management plan. Patient is aware to call the clinic if they develop any new symptoms or if  symptoms persist or worsen. Patient is aware when to return to the clinic for a follow-up visit. Patient educated on when it is appropriate to go to the emergency department.   Kari Baars, FNP-C [facility] [phone]

## 2022-07-30 NOTE — Addendum Note (Signed)
Addended by: Suzan Slick on: 07/30/2022 04:29 PM   Modules accepted: Orders

## 2022-08-01 LAB — URINE CULTURE: Organism ID, Bacteria: NO GROWTH

## 2022-08-06 ENCOUNTER — Ambulatory Visit
Admission: RE | Admit: 2022-08-06 | Discharge: 2022-08-06 | Disposition: A | Discharge status: Home / Self Care | Payer: BC Managed Care – PPO | Source: Ambulatory Visit | Attending: Family Medicine | Admitting: Family Medicine

## 2022-08-06 ENCOUNTER — Other Ambulatory Visit: Payer: Self-pay

## 2022-08-06 VITALS — BP 134/95 | HR 67 | Temp 98.5°F | Resp 20

## 2022-08-06 DIAGNOSIS — J441 Chronic obstructive pulmonary disease with (acute) exacerbation: Secondary | ICD-10-CM | POA: Insufficient documentation

## 2022-08-06 DIAGNOSIS — R059 Cough, unspecified: Secondary | ICD-10-CM | POA: Insufficient documentation

## 2022-08-06 DIAGNOSIS — J069 Acute upper respiratory infection, unspecified: Secondary | ICD-10-CM | POA: Insufficient documentation

## 2022-08-06 DIAGNOSIS — Z1152 Encounter for screening for COVID-19: Secondary | ICD-10-CM | POA: Diagnosis not present

## 2022-08-06 MED ORDER — PREDNISONE 20 MG PO TABS: 40.0000 mg | ORAL_TABLET | Freq: Every day | ORAL | 0 refills | Status: DC

## 2022-08-06 MED ORDER — PROMETHAZINE-DM 6.25-15 MG/5ML PO SYRP: 5.0000 mL | ORAL_SOLUTION | Freq: Four times a day (QID) | ORAL | 0 refills | Status: DC | PRN

## 2022-08-06 MED ORDER — DEXAMETHASONE SODIUM PHOSPHATE 10 MG/ML IJ SOLN
10.0000 mg | Freq: Once | INTRAMUSCULAR | Status: AC
  Administered 2022-08-06: 10 mg via INTRAMUSCULAR

## 2022-08-06 MED ORDER — GUAIFENESIN ER 600 MG PO TB12: 600.0000 mg | ORAL_TABLET | Freq: Two times a day (BID) | ORAL | 0 refills | Status: DC | PRN

## 2022-08-06 NOTE — ED Triage Notes (Addendum)
Pt reports cough since Monday. Pt reports increased muscle pain with coughing, body aches, fever since yesterday. Reports continued fatigue at this time.   Pt reports wears home o2 at night and reports oxygen levels have been maintaining around 92%. Pt reports I usually run "95-96%". NAD noted.

## 2022-08-06 NOTE — ED Provider Notes (Signed)
[facility]    CSN: [id number] Arrival date & time: 08/06/22  1239      History   Chief Complaint Chief Complaint  Patient presents with   Cough    Cough, congestion, low grade fever, headache/pressure, fatigue - Entered by patient    HPI Chase Christensen is a 52 y.o. male.   Presenting today with 2-day history of productive cough, wheezing, chest tightness, shortness of breath, fever, chills, body aches, congestion.  Denies chest pain, abdominal pain, nausea vomiting or diarrhea.  So far using his home inhalers with only mild temporary relief.  History of COPD on night oxygen, states his typical oxygen levels run around 95 to 96% and he has been running 92 to 93% since onset of illness.  No known sick contacts recently.  Not tried anything over-the-counter for symptoms.    Past Medical History:  Diagnosis Date   COPD (chronic obstructive pulmonary disease) ([place])    Diabetes ([place]) 09/17/2021   ED (erectile dysfunction)    Gout    Hypertension    Pneumonia    Prediabetes 09/18/2020    Patient Active Problem List   Diagnosis Date Noted   Mediastinal lymphadenopathy 05/07/2022   Type 2 diabetes mellitus with hyperglycemia, without long-term current use of insulin ([place]) 09/17/2021   COPD without exacerbation ([place]) 03/19/2021   Controlled gout 09/13/2020   Gastroesophageal reflux disease 08/27/2017   Erectile dysfunction 06/08/2015   Essential hypertension 05/03/2015   Tobacco use 05/03/2015    Past Surgical History:  Procedure Laterality Date   KNEE ARTHROSCOPY Right 03/15/2015   WISDOM TOOTH EXTRACTION         Home Medications    Prior to Admission medications   Medication Sig Start Date End Date Taking? Authorizing Provider  guaiFENesin (MUCINEX) 600 MG 12 hr tablet Take 1 tablet (600 mg total) by mouth 2 (two) times daily as needed. 08/06/22  Yes Volney American, PA-C  predniSONE (DELTASONE) 20 MG tablet Take 2 tablets (40 mg total) by mouth  daily with breakfast. 08/06/22  Yes Volney American, PA-C  promethazine-dextromethorphan (PROMETHAZINE-DM) 6.25-15 MG/5ML syrup Take 5 mLs by mouth 4 (four) times daily as needed. 08/06/22  Yes Volney American, PA-C  albuterol (VENTOLIN HFA) 108 (90 Base) MCG/ACT inhaler Inhale 2 puffs into the lungs every 6 (six) hours as needed for wheezing or shortness of breath. 09/17/21   Loman Brooklyn, FNP  allopurinol (ZYLOPRIM) 100 MG tablet Take 2 tablets (200 mg total) by mouth daily. 03/26/22   Loman Brooklyn, FNP  amLODipine (NORVASC) 10 MG tablet Take 1 tablet (10 mg total) by mouth daily. 03/26/22   Loman Brooklyn, FNP  ciprofloxacin (CIPRO) 500 MG tablet Take 1 tablet (500 mg total) by mouth 2 (two) times daily for 28 days. 07/30/22 08/27/22  Baruch Gouty, FNP  colchicine 0.6 MG tablet Take 2 tablets (1.2 mg) by mouth at  onset of gout flare, then 1 tablet (0.6 mg) 1 hour later. 09/17/21   Loman Brooklyn, FNP  HYDROcodone-acetaminophen (NORCO) 5-325 MG tablet Take 1 tablet by mouth every 6 (six) hours as needed. 07/25/22   Suzan Slick, NP  lisinopril (ZESTRIL) 40 MG tablet Take 1 tablet (40 mg total) by mouth daily. 03/26/22   Loman Brooklyn, FNP  metFORMIN (GLUCOPHAGE-XR) 500 MG 24 hr tablet Take 1 tablet (500 mg total) by mouth 2 (two) times daily with a meal. 03/26/22   Loman Brooklyn, FNP  pantoprazole (  PROTONIX) 40 MG tablet Take 1 tablet (40 mg total) by mouth daily before breakfast. 03/26/22   Loman Brooklyn, FNP  phenazopyridine (PYRIDIUM) 200 MG tablet Take 1 tablet (200 mg total) by mouth 3 (three) times daily as needed for pain. Please note this medicine will turn your urine orange 06/26/22   Volney American, PA-C  predniSONE (DELTASONE) 50 MG tablet Take one tablet by mouth once daily for 5 days. 07/30/22   Suzan Slick, NP  rosuvastatin (CRESTOR) 5 MG tablet Take 1 tablet (5 mg total) by mouth daily with supper. 03/26/22   Loman Brooklyn, FNP  sildenafil  (VIAGRA) 100 MG tablet Take 1 tablet (100 mg total) by mouth daily as needed for erectile dysfunction. 03/19/21   Loman Brooklyn, FNP  Tiotropium Bromide Monohydrate (SPIRIVA RESPIMAT) 2.5 MCG/ACT AERS Inhale 2 puffs into the lungs daily. 03/26/22   Loman Brooklyn, FNP    Family History Family History  Problem Relation Age of Onset   Hypertension Mother    Diabetes Maternal Grandmother    Lung cancer Maternal Grandfather    Pancreatic cancer Paternal Grandmother    Colon cancer Paternal Uncle    Colon polyps Cousin    Cancer Other        all paternal uncles and anunts passed with cancer    Social History Social History   Tobacco Use   Smoking status: Every Day    Packs/day: 0.25    Types: Cigarettes   Smokeless tobacco: Former    Types: Nurse, children's Use: Never used  Substance Use Topics   Alcohol use: Yes    Comment: occas   Drug use: No     Allergies   Penicillins   Review of Systems Review of Systems Per HPI  Physical Exam Triage Vital Signs ED Triage Vitals  Enc Vitals Group     BP 08/06/22 1314 (!) 134/95     Pulse Rate 08/06/22 1314 67     Resp 08/06/22 1314 20     Temp 08/06/22 1314 98.5 F (36.9 C)     Temp Source 08/06/22 1314 Oral     SpO2 08/06/22 1314 93 %     Weight --      Height --      Head Circumference --      Peak Flow --      Pain Score 08/06/22 1312 6     Pain Loc --      Pain Edu? --      Excl. in [place]? --    No data found.  Updated Vital Signs BP (!) 134/95 (BP Location: Right Arm)   Pulse 67   Temp 98.5 F (36.9 C) (Oral)   Resp 20   SpO2 93%   Visual Acuity Right Eye Distance:   Left Eye Distance:   Bilateral Distance:    Right Eye Near:   Left Eye Near:    Bilateral Near:     Physical Exam Vitals and nursing note reviewed.  Constitutional:      Appearance: He is well-developed.  HENT:     Head: Atraumatic.     Right Ear: External ear normal.     Left Ear: External ear normal.     Nose:  Congestion present.     Mouth/Throat:     Mouth: Mucous membranes are moist.     Pharynx: Oropharynx is clear. Posterior oropharyngeal erythema present. No oropharyngeal exudate.  Eyes:  Conjunctiva/sclera: Conjunctivae normal.     Pupils: Pupils are equal, round, and reactive to light.  Cardiovascular:     Rate and Rhythm: Normal rate and regular rhythm.     Heart sounds: Normal heart sounds.  Pulmonary:     Effort: Pulmonary effort is normal. No respiratory distress.     Breath sounds: Wheezing present. No rales.  Musculoskeletal:        General: Normal range of motion.     Cervical back: Normal range of motion and neck supple.  Lymphadenopathy:     Cervical: No cervical adenopathy.  Skin:    General: Skin is warm and dry.  Neurological:     Mental Status: He is alert and oriented to person, place, and time.     Motor: No weakness.     Gait: Gait normal.  Psychiatric:        Behavior: Behavior normal.      UC Treatments / Results  Labs (all labs ordered are listed, but only abnormal results are displayed) Labs Reviewed  SARS CORONAVIRUS 2 (TAT 6-24 HRS)    EKG   Radiology No results found.  Procedures Procedures (including critical care time)  Medications Ordered in UC Medications  dexamethasone (DECADRON) injection 10 mg (has no administration in time range)    Initial Impression / Assessment and Plan / UC Course  I have reviewed the triage vital signs and the nursing notes.  Pertinent labs & imaging results that were available during my care of the patient were reviewed by me and considered in my medical decision making (see chart for details).     Vital signs overall reassuring, oxygen saturation 93% on room air and he appears in no respiratory distress.  Do suspect viral upper respiratory infection, likely COVID-19 causing a COPD exacerbation.  Treat with IM Decadron, Mucinex, Phenergan DM and may start oral prednisone if still having difficulty  breathing.  Continue inhaler regimen and good candidate for molnupiravir if positive for COVID.  Return for any worsening symptoms.  Final Clinical Impressions(s) / UC Diagnoses   Final diagnoses:  Viral URI with cough  COPD exacerbation [facility])   Discharge Instructions   None    ED Prescriptions     Medication Sig Dispense Auth. Provider   predniSONE (DELTASONE) 20 MG tablet Take 2 tablets (40 mg total) by mouth daily with breakfast. 10 tablet Volney American, PA-C   guaiFENesin (MUCINEX) 600 MG 12 hr tablet Take 1 tablet (600 mg total) by mouth 2 (two) times daily as needed. 20 tablet Volney American, [place]   promethazine-dextromethorphan (PROMETHAZINE-DM) 6.25-15 MG/5ML syrup Take 5 mLs by mouth 4 (four) times daily as needed. 100 mL Volney American, [place]      PDMP not reviewed this encounter.   Volney American, [place] 08/06/22 1349

## 2022-08-07 LAB — SARS CORONAVIRUS 2 (TAT 6-24 HRS): SARS Coronavirus 2: NEGATIVE

## 2022-08-14 ENCOUNTER — Ambulatory Visit: Payer: BC Managed Care – PPO | Admitting: Orthopaedic Surgery

## 2022-08-14 ENCOUNTER — Ambulatory Visit: Payer: BC Managed Care – PPO | Admitting: Pulmonary Disease

## 2022-08-25 ENCOUNTER — Encounter: Payer: Self-pay | Admitting: Pulmonary Disease

## 2022-08-25 ENCOUNTER — Ambulatory Visit: Payer: BC Managed Care – PPO | Admitting: Pulmonary Disease

## 2022-08-25 VITALS — BP 138/72 | HR 86 | Ht 72.0 in | Wt 252.4 lb

## 2022-08-25 DIAGNOSIS — R590 Localized enlarged lymph nodes: Secondary | ICD-10-CM

## 2022-08-25 DIAGNOSIS — Z720 Tobacco use: Secondary | ICD-10-CM

## 2022-08-25 DIAGNOSIS — J449 Chronic obstructive pulmonary disease, unspecified: Secondary | ICD-10-CM

## 2022-08-25 DIAGNOSIS — J432 Centrilobular emphysema: Secondary | ICD-10-CM | POA: Diagnosis not present

## 2022-08-25 DIAGNOSIS — G4734 Idiopathic sleep related nonobstructive alveolar hypoventilation: Secondary | ICD-10-CM | POA: Diagnosis not present

## 2022-08-25 NOTE — Assessment & Plan Note (Signed)
Paratracheal lymphadenopathy 10 mm stable for 8 months indicating this is likely benign. Will obtain 23-monthfollow-up CT chest with contrast in June 2024

## 2022-08-25 NOTE — Assessment & Plan Note (Signed)
Smoking cessation was again emphasized the most important intervention that would add years to his life.  He will continue to try and cut down, he has done well with nicotine patches.  I asked him to take nicotine lozenges for breakthrough and we can consider Nicotrol inhaler in the future

## 2022-08-25 NOTE — Patient Instructions (Signed)
  X ONO on O2  X Sample of stiolto INSTEAD of spiriva  X Ct chest w con in June 2024

## 2022-08-25 NOTE — Assessment & Plan Note (Signed)
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 He feels nocturnal oxygen has helped improve his breathing in the daytime when he wakes up feeling refreshed. We will repeat oximetry on oxygen and ensure that hypoxia has been corrected

## 2022-08-25 NOTE — Assessment & Plan Note (Signed)
Mild reversible obstruction on PFTs. We will step up from Spiriva to [facility], sample was provided.  If he continues to have variability in lung function clinically, then we may consider adding an inhaled steroid. He will use albuterol on an as-needed basis

## 2022-08-25 NOTE — Progress Notes (Signed)
Subjective:    Patient ID: Chase Christensen, male    DOB: 1971-04-10, 52 y.o.   MRN: [id number]  HPI  51-yo smoker for follow-up of COPD and mediastinal lymphadenopathy He works as a Production designer, theatre/television/film.  He has smoked 1 to 1.5 packs/day starting as a teenager, more than 30 pack years, he also chews tobacco  PMH - DM2  Initial office visit was in November He has been able to cut down smoking to half to 1 pack/day.  Spiriva helped somewhat with his breathing. We undertook nocturnal oximetry after his initial visit and start him on oxygen, he did not want further evaluation for OSA He had an ED visit 2 weeks ago for viral bronchitis, tested negative for COVID and flu, was given prednisone for 5 days  Significant tests/ events reviewed  PFTs 05/2022, mild airway obstruction, ratio 70, FEV1 66%, FVC 73%, improved FEV1 to 75% postbronchodilator, TLC normal, DLCO 84% 05/2022 ONO desaturation less than 88% for about 4 hours 10/2021 CT chest wo con >>1 cm pretracheal LN, Moderate paraseptal emphysema   06/2022 CT chest w con >> stable 10mm LN  Review of Systems neg for any significant sore throat, dysphagia, itching, sneezing, nasal congestion or excess/ purulent secretions, fever, chills, sweats, unintended wt loss, pleuritic or exertional cp, hempoptysis, orthopnea pnd or change in chronic leg swelling. Also denies presyncope, palpitations, heartburn, abdominal pain, nausea, vomiting, diarrhea or change in bowel or urinary habits, dysuria,hematuria, rash, arthralgias, visual complaints, headache, numbness weakness or ataxia.     Objective:   Physical Exam  Gen. Pleasant, obese, in no distress ENT - no lesions, no post nasal drip Neck: No JVD, no thyromegaly, no carotid bruits Lungs: no use of accessory muscles, no dullness to percussion, decreased without rales or rhonchi  Cardiovascular: Rhythm regular, heart sounds  normal, no murmurs or gallops, no peripheral  edema Musculoskeletal: No deformities, no cyanosis or clubbing , no tremors       Assessment & Plan:

## 2022-08-26 DIAGNOSIS — J449 Chronic obstructive pulmonary disease, unspecified: Secondary | ICD-10-CM | POA: Diagnosis not present

## 2022-09-04 ENCOUNTER — Other Ambulatory Visit: Payer: Self-pay | Admitting: Family Medicine

## 2022-09-04 DIAGNOSIS — E1165 Type 2 diabetes mellitus with hyperglycemia: Secondary | ICD-10-CM

## 2022-09-23 ENCOUNTER — Other Ambulatory Visit: Payer: Self-pay | Admitting: Family Medicine

## 2022-09-23 DIAGNOSIS — I10 Essential (primary) hypertension: Secondary | ICD-10-CM

## 2022-09-23 DIAGNOSIS — M109 Gout, unspecified: Secondary | ICD-10-CM

## 2022-09-23 DIAGNOSIS — K219 Gastro-esophageal reflux disease without esophagitis: Secondary | ICD-10-CM

## 2022-09-23 DIAGNOSIS — E1165 Type 2 diabetes mellitus with hyperglycemia: Secondary | ICD-10-CM

## 2022-09-24 ENCOUNTER — Encounter: Payer: Self-pay | Admitting: Family Medicine

## 2022-09-24 ENCOUNTER — Ambulatory Visit: Payer: BC Managed Care – PPO | Admitting: Family Medicine

## 2022-09-24 ENCOUNTER — Telehealth: Payer: Self-pay | Admitting: Pulmonary Disease

## 2022-09-24 VITALS — BP 119/83 | HR 97 | Temp 98.3°F | Ht 73.0 in | Wt 255.4 lb

## 2022-09-24 DIAGNOSIS — Z7984 Long term (current) use of oral hypoglycemic drugs: Secondary | ICD-10-CM

## 2022-09-24 DIAGNOSIS — M109 Gout, unspecified: Secondary | ICD-10-CM

## 2022-09-24 DIAGNOSIS — E1159 Type 2 diabetes mellitus with other circulatory complications: Secondary | ICD-10-CM | POA: Diagnosis not present

## 2022-09-24 DIAGNOSIS — E1165 Type 2 diabetes mellitus with hyperglycemia: Secondary | ICD-10-CM

## 2022-09-24 DIAGNOSIS — J449 Chronic obstructive pulmonary disease, unspecified: Secondary | ICD-10-CM | POA: Diagnosis not present

## 2022-09-24 DIAGNOSIS — K219 Gastro-esophageal reflux disease without esophagitis: Secondary | ICD-10-CM

## 2022-09-24 DIAGNOSIS — I10 Essential (primary) hypertension: Secondary | ICD-10-CM | POA: Diagnosis not present

## 2022-09-24 DIAGNOSIS — I152 Hypertension secondary to endocrine disorders: Secondary | ICD-10-CM

## 2022-09-24 LAB — BAYER DCA HB A1C WAIVED: HB A1C (BAYER DCA - WAIVED): 6.1 % — ABNORMAL HIGH (ref 4.8–5.6)

## 2022-09-24 LAB — LIPID PANEL

## 2022-09-24 MED ORDER — LISINOPRIL 40 MG PO TABS: 40.0000 mg | ORAL_TABLET | Freq: Every day | ORAL | 1 refills | Status: DC

## 2022-09-24 MED ORDER — PANTOPRAZOLE SODIUM 40 MG PO TBEC: 40.0000 mg | DELAYED_RELEASE_TABLET | Freq: Every day | ORAL | 1 refills | Status: DC

## 2022-09-24 MED ORDER — AMLODIPINE BESYLATE 10 MG PO TABS: 10.0000 mg | ORAL_TABLET | Freq: Every day | ORAL | 1 refills | Status: DC

## 2022-09-24 MED ORDER — ALLOPURINOL 100 MG PO TABS: 200.0000 mg | ORAL_TABLET | Freq: Every day | ORAL | 1 refills | Status: DC

## 2022-09-24 NOTE — Telephone Encounter (Signed)
ATC patient  Left a detailed vm on cell phone ( ok per dpr with results) advised patient on vm to call back for questions or concerns.

## 2022-09-24 NOTE — Patient Instructions (Addendum)

## 2022-09-24 NOTE — Telephone Encounter (Signed)
ONO on 2L shows desat for only 11 mins Acceptable, continue on O2 at this level

## 2022-09-24 NOTE — Progress Notes (Signed)
Subjective:  Patient ID: Chase Christensen, male    DOB: [birth year], 52 y.o.   MRN: [id number]  Patient Care Team: Sonny Masters, FNP as PCP - General (Family Medicine)   Chief Complaint:  Establish Care (Previous [facility] patient ) and Medical Management of Chronic Issues (6 month follow up )   HPI: Chase Christensen is a 52 y.o. male presenting on 09/24/2022 for Establish Care (Previous [facility] patient ) and Medical Management of Chronic Issues (6 month follow up )  Presents today to establish care with new PCP and for management of chronic medical conditions.   1. Type 2 diabetes mellitus with hyperglycemia, without long-term current use of insulin (HCC) Currently on Metformin and tolerating well. States he has had a few readings in the 130s. No polyuria, polyphagia, or polydipsia. Does try to follow a healthy diet. Is active daily but does not follow an exercise routine.   2. Hypertension associated with type 2 diabetes mellitus (HCC) Currently on Metformin and Lisinopril. Tolerating well. Does try to limit sodium intake. Denies headaches, chest pain, leg swelling, weakness, confusion, visual changes, or syncope.  3. Controlled gout On preventative therapy and reports he has not had issues or needed colchicine in several months.   4. Gastroesophageal reflux disease without esophagitis On PPI therapy and tolerating well. Denies sore throat, cough, dysphagia, hemoptysis, voice change, melena, or hematochezia. No weight changes, fever, chills, or night sweats.      Relevant past medical, surgical, family, and social history reviewed and updated as indicated.  Allergies and medications reviewed and updated. Data reviewed: Chart in Epic.   Past Medical History:  Diagnosis Date   COPD (chronic obstructive pulmonary disease) (HCC)    Diabetes (HCC) 09/17/2021   ED (erectile dysfunction)    Gout    Hypertension    Pneumonia    Prediabetes 09/18/2020    Past Surgical History:   Procedure Laterality Date   KNEE ARTHROSCOPY Right 03/15/2015   WISDOM TOOTH EXTRACTION      Social History   Socioeconomic History   Marital status: Divorced    Spouse name: Not on file   Number of children: 1   Years of education: Not on file   Highest education level: Not on file  Occupational History   Occupation: servic tech/ [employer]   Occupation: Sports coach  Tobacco Use   Smoking status: Every Day    Packs/day: .25    Types: Cigarettes   Smokeless tobacco: Former    Types: Associate Professor Use: Never used  Substance and Sexual Activity   Alcohol use: Yes    Comment: occas   Drug use: No   Sexual activity: Yes  Other Topics Concern   Not on file  Social History Narrative   Not on file   Social Determinants of Health   Financial Resource Strain: Not on file  Food Insecurity: Not on file  Transportation Needs: Not on file  Physical Activity: Not on file  Stress: Not on file  Social Connections: Not on file  Intimate Partner Violence: Not on file    Outpatient Encounter Medications as of 09/24/2022  Medication Sig   albuterol (VENTOLIN HFA) 108 (90 Base) MCG/ACT inhaler Inhale 2 puffs into the lungs every 6 (six) hours as needed for wheezing or shortness of breath.   colchicine 0.6 MG tablet Take 2 tablets (1.2 mg) by mouth at  onset of gout flare, then 1 tablet (  0.6 mg) 1 hour later.   metFORMIN (GLUCOPHAGE-XR) 500 MG 24 hr tablet TAKE 1 TABLET TWICE A DAY WITH MEALS   rosuvastatin (CRESTOR) 5 MG tablet TAKE 1 TABLET DAILY WITH SUPPER   sildenafil (VIAGRA) 100 MG tablet Take 1 tablet (100 mg total) by mouth daily as needed for erectile dysfunction.   Tiotropium Bromide Monohydrate (SPIRIVA RESPIMAT) 2.5 MCG/ACT AERS Inhale 2 puffs into the lungs daily.   [DISCONTINUED] allopurinol (ZYLOPRIM) 100 MG tablet Take 2 tablets (200 mg total) by mouth daily.   [DISCONTINUED] amLODipine (NORVASC) 10 MG tablet Take 1 tablet (10 mg total) by  mouth daily.   [DISCONTINUED] lisinopril (ZESTRIL) 40 MG tablet Take 1 tablet (40 mg total) by mouth daily.   [DISCONTINUED] pantoprazole (PROTONIX) 40 MG tablet Take 1 tablet (40 mg total) by mouth daily before breakfast.   allopurinol (ZYLOPRIM) 100 MG tablet Take 2 tablets (200 mg total) by mouth daily.   amLODipine (NORVASC) 10 MG tablet Take 1 tablet (10 mg total) by mouth daily.   lisinopril (ZESTRIL) 40 MG tablet Take 1 tablet (40 mg total) by mouth daily.   pantoprazole (PROTONIX) 40 MG tablet Take 1 tablet (40 mg total) by mouth daily before breakfast.   No facility-administered encounter medications on file as of 09/24/2022.    Allergies  Allergen Reactions   Penicillins Rash    Review of Systems  Constitutional:  Negative for activity change, appetite change, chills, diaphoresis, fatigue, fever and unexpected weight change.  HENT: Negative.    Eyes: Negative.  Negative for photophobia and visual disturbance.  Respiratory:  Negative for cough, chest tightness and shortness of breath.   Cardiovascular:  Negative for chest pain, palpitations and leg swelling.  Gastrointestinal:  Negative for abdominal pain, blood in stool, constipation, diarrhea, nausea and vomiting.  Endocrine: Negative.  Negative for cold intolerance, heat intolerance, polydipsia, polyphagia and polyuria.  Genitourinary:  Negative for decreased urine volume, difficulty urinating, dysuria, frequency and urgency.  Musculoskeletal:  Negative for arthralgias and myalgias.  Skin: Negative.   Allergic/Immunologic: Negative.   Neurological:  Negative for dizziness, tremors, seizures, syncope, facial asymmetry, speech difficulty, weakness, light-headedness, numbness and headaches.  Hematological: Negative.   Psychiatric/Behavioral:  Negative for confusion, hallucinations, sleep disturbance and suicidal ideas.   All other systems reviewed and are negative.       Objective:  BP 119/83   Pulse 97   Temp 98.3 F  (36.8 C) (Temporal)   Ht 6\' 1"  (1.854 m)   Wt 255 lb 6.4 oz (115.8 kg)   SpO2 94%   BMI 33.70 kg/m    Wt Readings from Last 3 Encounters:  09/24/22 255 lb 6.4 oz (115.8 kg)  08/25/22 252 lb 6.4 oz (114.5 kg)  07/30/22 251 lb (113.9 kg)    Physical Exam Vitals and nursing note reviewed.  Constitutional:      General: He is not in acute distress.    Appearance: Normal appearance. He is well-developed and well-groomed. He is obese. He is not ill-appearing, toxic-appearing or diaphoretic.  HENT:     Head: Normocephalic and atraumatic.     Jaw: There is normal jaw occlusion.     Right Ear: Hearing normal.     Left Ear: Hearing normal.     Nose: Nose normal.     Mouth/Throat:     Lips: Pink.     Mouth: Mucous membranes are moist.     Pharynx: Oropharynx is clear. Uvula midline.  Eyes:     General:  Lids are normal.     Extraocular Movements: Extraocular movements intact.     Conjunctiva/sclera: Conjunctivae normal.     Pupils: Pupils are equal, round, and reactive to light.  Neck:     Thyroid: No thyroid mass, thyromegaly or thyroid tenderness.     Vascular: No carotid bruit or JVD.     Trachea: Trachea and phonation normal.  Cardiovascular:     Rate and Rhythm: Normal rate and regular rhythm.     Chest Wall: PMI is not displaced.     Pulses: Normal pulses.     Heart sounds: Normal heart sounds. No murmur heard.    No friction rub. No gallop.  Pulmonary:     Effort: Pulmonary effort is normal. No respiratory distress.     Breath sounds: Normal breath sounds. No wheezing.  Abdominal:     General: Bowel sounds are normal. There is no abdominal bruit.     Palpations: Abdomen is soft. There is no hepatomegaly or splenomegaly.  Musculoskeletal:        General: Normal range of motion.     Cervical back: Normal range of motion and neck supple.     Right lower leg: No edema.     Left lower leg: No edema.  Feet:     Comments: Pt declined Lymphadenopathy:     Cervical: No  cervical adenopathy.  Skin:    General: Skin is warm and dry.     Capillary Refill: Capillary refill takes less than 2 seconds.     Coloration: Skin is not cyanotic, jaundiced or pale.     Findings: No rash.  Neurological:     General: No focal deficit present.     Mental Status: He is alert and oriented to person, place, and time.     Sensory: Sensation is intact.     Motor: Motor function is intact.     Coordination: Coordination is intact.     Gait: Gait is intact.     Deep Tendon Reflexes: Reflexes are normal and symmetric.  Psychiatric:        Attention and Perception: Attention and perception normal.        Mood and Affect: Mood and affect normal.        Speech: Speech normal.        Behavior: Behavior normal. Behavior is cooperative.        Thought Content: Thought content normal.        Cognition and Memory: Cognition and memory normal.        Judgment: Judgment normal.     Results for orders placed or performed during the hospital encounter of 08/06/22  SARS CORONAVIRUS 2 (TAT 6-24 HRS) Anterior Nasal Swab   Specimen: Anterior Nasal Swab  Result Value Ref Range   SARS Coronavirus 2 NEGATIVE NEGATIVE       Pertinent labs & imaging results that were available during my care of the patient were reviewed by me and considered in my medical decision making.  Assessment & Plan:  Warne was seen today for establish care and medical management of chronic issues.  Diagnoses and all orders for this visit:  Type 2 diabetes mellitus with hyperglycemia, without long-term current use of insulin (HCC) A1C 6.1, great control. Continue metformin as prescribed. Diet and exercise encouraged. Has upcoming visit for eye exam.  -     Bayer DCA Hb A1c Waived -     lisinopril (ZESTRIL) 40 MG tablet; Take 1 tablet (40 mg total) by  mouth daily.  Hypertension associated with type 2 diabetes mellitus (HCC) BP well controlled. Changes were not made in regimen today. Goal BP is 130/80. Pt  aware to report any persistent high or low readings. DASH diet and exercise encouraged. Exercise at least 150 minutes per week and increase as tolerated. Goal BMI > 25. Stress management encouraged. Avoid nicotine and tobacco product use. Avoid excessive alcohol and NSAID's. Avoid more than 2000 mg of sodium daily. Medications as prescribed. Follow up as scheduled.  -     Lipid panel -     CBC with Differential/Platelet -     CMP14+EGFR -     amLODipine (NORVASC) 10 MG tablet; Take 1 tablet (10 mg total) by mouth daily. -     lisinopril (ZESTRIL) 40 MG tablet; Take 1 tablet (40 mg total) by mouth daily.  Controlled gout Well controlled with current regimen. Has not required colchicine in several months. Continue below.  -     allopurinol (ZYLOPRIM) 100 MG tablet; Take 2 tablets (200 mg total) by mouth daily.  Gastroesophageal reflux disease without esophagitis No red flags present. Diet discussed. Avoid fried, spicy, fatty, greasy, and acidic foods. Avoid caffeine, nicotine, and alcohol. Do not eat 2-3 hours before bedtime and stay upright for at least 1-2 hours after eating. Eat small frequent meals. Avoid NSAID's like motrin and aleve. Medications as prescribed. Report any new or worsening symptoms. Follow up as discussed or sooner if needed.   -     pantoprazole (PROTONIX) 40 MG tablet; Take 1 tablet (40 mg total) by mouth daily before breakfast.     Continue all other maintenance medications.  Follow up plan: Return in 6 months (on 03/27/2023) for DM.   Continue healthy lifestyle choices, including diet (rich in fruits, vegetables, and lean proteins, and low in salt and simple carbohydrates) and exercise (at least 30 minutes of moderate physical activity daily).  Educational handout given for DM  The above assessment and management plan was discussed with the patient. The patient verbalized understanding of and has agreed to the management plan. Patient is aware to call the clinic if  they develop any new symptoms or if symptoms persist or worsen. Patient is aware when to return to the clinic for a follow-up visit. Patient educated on when it is appropriate to go to the emergency department.   Kari Baars, FNP-C [facility] [phone]

## 2022-09-25 LAB — CBC WITH DIFFERENTIAL/PLATELET
Basophils Absolute: 0 10*3/uL (ref 0.0–0.2)
Basos: 0 %
EOS (ABSOLUTE): 0.4 10*3/uL (ref 0.0–0.4)
Eos: 3 %
Hematocrit: 45 % (ref 37.5–51.0)
Hemoglobin: 15.6 g/dL (ref 13.0–17.7)
Immature Grans (Abs): 0.1 10*3/uL (ref 0.0–0.1)
Immature Granulocytes: 1 %
Lymphocytes Absolute: 2.3 10*3/uL (ref 0.7–3.1)
Lymphs: 17 %
MCH: 32.6 pg (ref 26.6–33.0)
MCHC: 34.7 g/dL (ref 31.5–35.7)
MCV: 94 fL (ref 79–97)
Monocytes Absolute: 0.9 10*3/uL (ref 0.1–0.9)
Monocytes: 6 %
Neutrophils Absolute: 9.8 10*3/uL — ABNORMAL HIGH (ref 1.4–7.0)
Neutrophils: 73 %
Platelets: 206 10*3/uL (ref 150–450)
RBC: 4.79 x10E6/uL (ref 4.14–5.80)
RDW: 12.4 % (ref 11.6–15.4)
WBC: 13.5 10*3/uL — ABNORMAL HIGH (ref 3.4–10.8)

## 2022-09-25 LAB — CMP14+EGFR
ALT: 26 IU/L (ref 0–44)
AST: 17 IU/L (ref 0–40)
Albumin/Globulin Ratio: 2.1 (ref 1.2–2.2)
Albumin: 4.2 g/dL (ref 4.1–5.1)
Alkaline Phosphatase: 74 IU/L (ref 44–121)
BUN/Creatinine Ratio: 20 (ref 9–20)
BUN: 21 mg/dL (ref 6–24)
Bilirubin Total: 0.3 mg/dL (ref 0.0–1.2)
CO2: 21 mmol/L (ref 20–29)
Calcium: 9.5 mg/dL (ref 8.7–10.2)
Chloride: 108 mmol/L — ABNORMAL HIGH (ref 96–106)
Creatinine, Ser: 1.07 mg/dL (ref 0.76–1.27)
Globulin, Total: 2 g/dL (ref 1.5–4.5)
Glucose: 192 mg/dL — ABNORMAL HIGH (ref 70–99)
Potassium: 4.2 mmol/L (ref 3.5–5.2)
Sodium: 144 mmol/L (ref 134–144)
Total Protein: 6.2 g/dL (ref 6.0–8.5)
eGFR: 85 mL/min/{1.73_m2} (ref >59)

## 2022-09-25 LAB — LIPID PANEL
Chol/HDL Ratio: 3 ratio (ref 0.0–5.0)
Cholesterol, Total: 95 mg/dL — ABNORMAL LOW (ref 100–199)
HDL: 32 mg/dL — ABNORMAL LOW (ref >39)
LDL Chol Calc (NIH): 37 mg/dL (ref 0–99)
Triglycerides: 156 mg/dL — ABNORMAL HIGH (ref 0–149)
VLDL Cholesterol Cal: 26 mg/dL (ref 5–40)

## 2022-10-25 DIAGNOSIS — J449 Chronic obstructive pulmonary disease, unspecified: Secondary | ICD-10-CM | POA: Diagnosis not present

## 2022-11-20 ENCOUNTER — Ambulatory Visit: Payer: BC Managed Care – PPO | Admitting: Orthopedic Surgery

## 2022-11-21 ENCOUNTER — Ambulatory Visit: Payer: BC Managed Care – PPO | Admitting: Physical Medicine and Rehabilitation

## 2022-11-21 DIAGNOSIS — M545 Low back pain, unspecified: Secondary | ICD-10-CM | POA: Diagnosis not present

## 2022-11-24 DIAGNOSIS — J449 Chronic obstructive pulmonary disease, unspecified: Secondary | ICD-10-CM | POA: Diagnosis not present

## 2022-12-03 ENCOUNTER — Other Ambulatory Visit: Payer: Self-pay | Admitting: Family Medicine

## 2022-12-03 DIAGNOSIS — E1165 Type 2 diabetes mellitus with hyperglycemia: Secondary | ICD-10-CM

## 2022-12-08 ENCOUNTER — Telehealth: Payer: Self-pay | Admitting: Pulmonary Disease

## 2022-12-08 DIAGNOSIS — M545 Low back pain, unspecified: Secondary | ICD-10-CM | POA: Diagnosis not present

## 2022-12-08 NOTE — Telephone Encounter (Signed)
Toma Copier states CT scan needs to be precerted for CT scan with contrast. [facility] phone number is [phone].

## 2022-12-09 NOTE — Telephone Encounter (Signed)
Bethany checking on CT scan. [facility] phone number is [phone].

## 2022-12-09 NOTE — Telephone Encounter (Signed)
Shakima worked on auth - I sent her a message.

## 2022-12-10 ENCOUNTER — Ambulatory Visit (HOSPITAL_COMMUNITY): Payer: BC Managed Care – PPO

## 2022-12-16 DIAGNOSIS — M545 Low back pain, unspecified: Secondary | ICD-10-CM | POA: Diagnosis not present

## 2022-12-22 ENCOUNTER — Ambulatory Visit (HOSPITAL_COMMUNITY)
Admission: RE | Admit: 2022-12-22 | Discharge: 2022-12-22 | Disposition: A | Discharge status: Home / Self Care | Payer: BC Managed Care – PPO | Source: Ambulatory Visit | Attending: Pulmonary Disease | Admitting: Pulmonary Disease

## 2022-12-22 ENCOUNTER — Encounter (HOSPITAL_COMMUNITY): Payer: Self-pay | Admitting: Radiology

## 2022-12-22 DIAGNOSIS — J432 Centrilobular emphysema: Secondary | ICD-10-CM | POA: Insufficient documentation

## 2022-12-22 DIAGNOSIS — J439 Emphysema, unspecified: Secondary | ICD-10-CM | POA: Diagnosis not present

## 2022-12-22 LAB — POCT I-STAT CREATININE: Creatinine, Ser: 1.1 mg/dL (ref 0.61–1.24)

## 2022-12-22 MED ORDER — IOHEXOL 300 MG/ML  SOLN
75.0000 mL | Freq: Once | INTRAMUSCULAR | Status: AC | PRN
  Administered 2022-12-22: 75 mL via INTRAVENOUS

## 2022-12-25 DIAGNOSIS — J449 Chronic obstructive pulmonary disease, unspecified: Secondary | ICD-10-CM | POA: Diagnosis not present

## 2022-12-31 DIAGNOSIS — M545 Low back pain, unspecified: Secondary | ICD-10-CM | POA: Diagnosis not present

## 2023-01-19 ENCOUNTER — Telehealth: Payer: Self-pay | Admitting: *Deleted

## 2023-01-19 NOTE — Telephone Encounter (Signed)
Patient called to discuss issues with Billing regarding [facility] oxygen supplies.Offered patient number to [employer] as well as to [employer]. Patient states he already has numbers.  Patient would like to see if he can switch to a different oxygen supply company   Please call and advise [phone]

## 2023-01-20 NOTE — Telephone Encounter (Signed)
Spoke with patient's wife and advised they would need to contact ins co to see who else they prefer as O2 supplier. They will contact them and let us know. Nothing further needed at this time.

## 2023-01-22 ENCOUNTER — Telehealth: Payer: Self-pay | Admitting: Pulmonary Disease

## 2023-01-22 DIAGNOSIS — G4734 Idiopathic sleep related nonobstructive alveolar hypoventilation: Secondary | ICD-10-CM

## 2023-01-22 NOTE — Telephone Encounter (Signed)
Pt. Calling back about issues to prior message and [facility] [employer] and he wants to discontinue with them cause they keep charging his credit card $79 for the O2 need order from Dr. Vassie Loll for the DME to come get the O2 tanks

## 2023-01-22 NOTE — Addendum Note (Signed)
Addended by: Shelby Dubin on: 01/22/2023 05:05 PM   Modules accepted: Orders

## 2023-01-22 NOTE — Telephone Encounter (Signed)
Melissa called back to advise she has researched his account. Per their records and the [employer] EOBs the patient HAS NOT met his deductible, at least as of the last EOB for [facility] 10/25/22. The amount he is being billed is his responsibility toward the deductible. She is not sure why [employer] is not able to provide more information. She recommends patient contact [employer] directly to try and resolve the discrepancies.   Spoke with patient and he has already been on a 3-way call with [employer] and [employer]. [facility] admitted to billing incorrectly and this has caused some discrepancies with [employer]. [facility] is attempting to collect the balance of the incorrect billing from the patient. He has advised them he will no longer be doing business with them. Patient would like to d/c O2 at this time, feels he has been doing well without it. He is aware to contact us if he feels he needs to restart therapy.   Order to d/c O2 has been faxed to [facility].

## 2023-01-22 NOTE — Telephone Encounter (Signed)
Spoke with [place], they state patient is being billed for $79.73 as his portion of his deductible, per EOBs from [place].  Spoke with [employer] and they state patient has met his Individual and Family deductible, but were unable to tell me when this was effective.   Contacted [facility] again to advise, they state they can only go off of EOB until they hear otherwise from [place]. The agent provided me with the most recent completed claim info: 10/25/22 [id number] $106.31  Spoke with [employer], both number on card and local filing office, neither show any evidence of claims matching this information.   Spoke with [place] ([facility]) and she contacted [employer]. Melissa states she will contact the patient to review his account.   Nothing further needed at this time.

## 2023-02-17 ENCOUNTER — Other Ambulatory Visit: Payer: Self-pay | Admitting: Family Medicine

## 2023-02-17 DIAGNOSIS — E1165 Type 2 diabetes mellitus with hyperglycemia: Secondary | ICD-10-CM

## 2023-02-20 MED ORDER — METFORMIN HCL ER 500 MG PO TB24: 500.0000 mg | ORAL_TABLET | Freq: Two times a day (BID) | ORAL | 0 refills | Status: DC

## 2023-02-20 NOTE — Addendum Note (Signed)
Addended by: Julious Payer D on: 02/20/2023 01:40 PM   Modules accepted: Orders

## 2023-02-20 NOTE — Telephone Encounter (Signed)
PCP was removed this week, pt in process of finding new provider. Will give 30-d supply till he finds new PCP.

## 2023-03-03 ENCOUNTER — Other Ambulatory Visit: Payer: Self-pay | Admitting: Family Medicine

## 2023-03-03 DIAGNOSIS — E1165 Type 2 diabetes mellitus with hyperglycemia: Secondary | ICD-10-CM

## 2023-03-05 ENCOUNTER — Ambulatory Visit
Admission: RE | Admit: 2023-03-05 | Discharge: 2023-03-05 | Disposition: A | Discharge status: Home / Self Care | Payer: BC Managed Care – PPO | Source: Ambulatory Visit | Attending: Family Medicine | Admitting: Family Medicine

## 2023-03-05 VITALS — BP 120/80 | HR 76 | Temp 97.9°F | Resp 18

## 2023-03-05 DIAGNOSIS — J0100 Acute maxillary sinusitis, unspecified: Secondary | ICD-10-CM | POA: Diagnosis not present

## 2023-03-05 DIAGNOSIS — J441 Chronic obstructive pulmonary disease with (acute) exacerbation: Secondary | ICD-10-CM

## 2023-03-05 MED ORDER — AZITHROMYCIN 250 MG PO TABS: ORAL_TABLET | ORAL | 0 refills | Status: DC

## 2023-03-05 MED ORDER — PROMETHAZINE-DM 6.25-15 MG/5ML PO SYRP: 5.0000 mL | ORAL_SOLUTION | Freq: Four times a day (QID) | ORAL | 0 refills | Status: DC | PRN

## 2023-03-05 MED ORDER — PREDNISONE 20 MG PO TABS: 40.0000 mg | ORAL_TABLET | Freq: Every day | ORAL | 0 refills | Status: DC

## 2023-03-05 NOTE — ED Provider Notes (Signed)
[facility]    CSN: [id number] Arrival date & time: 03/05/23  1241      History   Chief Complaint Chief Complaint  Patient presents with   Nasal Congestion    Entered by patient   Cough    HPI Chase Christensen is a 52 y.o. male.   Patient presenting today with over a week of progressively worsening nasal congestion, sinus pain and pressure, ear pain, productive cough, shortness of breath, wheezing, chest tightness.  Denies fever, chills, body aches, abdominal pain, nausea vomiting or diarrhea.  Taking Mucinex and inhaler regimen for COPD with minimal relief.    Past Medical History:  Diagnosis Date   COPD (chronic obstructive pulmonary disease) (HCC)    Diabetes (HCC) 09/17/2021   ED (erectile dysfunction)    Gout    Hypertension    Pneumonia    Prediabetes 09/18/2020    Patient Active Problem List   Diagnosis Date Noted   Nocturnal hypoxia 08/25/2022   Mediastinal lymphadenopathy 05/07/2022   Type 2 diabetes mellitus with hyperglycemia, without long-term current use of insulin (HCC) 09/17/2021   COPD without exacerbation (HCC) 03/19/2021   Controlled gout 09/13/2020   Gastroesophageal reflux disease 08/27/2017   Erectile dysfunction associated with type 2 diabetes mellitus (HCC) 06/08/2015   Hypertension associated with type 2 diabetes mellitus (HCC) 05/03/2015   Tobacco use 05/03/2015    Past Surgical History:  Procedure Laterality Date   KNEE ARTHROSCOPY Right 03/15/2015   WISDOM TOOTH EXTRACTION         Home Medications    Prior to Admission medications   Medication Sig Start Date End Date Taking? Authorizing Provider  albuterol (VENTOLIN HFA) 108 (90 Base) MCG/ACT inhaler Inhale 2 puffs into the lungs every 6 (six) hours as needed for wheezing or shortness of breath. 09/17/21  Yes [name] F, FNP  allopurinol (ZYLOPRIM) 100 MG tablet Take 2 tablets (200 mg total) by mouth daily. 09/24/22  Yes Rakes, Doralee Albino, FNP  amLODipine (NORVASC)  10 MG tablet Take 1 tablet (10 mg total) by mouth daily. 09/24/22  Yes Rakes, Doralee Albino, FNP  azithromycin (ZITHROMAX) 250 MG tablet Take first 2 tablets together, then 1 every day until finished. 03/05/23  Yes Particia Nearing, PA-C  lisinopril (ZESTRIL) 40 MG tablet Take 1 tablet (40 mg total) by mouth daily. 09/24/22  Yes Rakes, Doralee Albino, FNP  metFORMIN (GLUCOPHAGE-XR) 500 MG 24 hr tablet Take 1 tablet (500 mg total) by mouth 2 (two) times daily with a meal. 02/20/23  Yes Rakes, Doralee Albino, FNP  pantoprazole (PROTONIX) 40 MG tablet Take 1 tablet (40 mg total) by mouth daily before breakfast. 09/24/22  Yes Rakes, Doralee Albino, FNP  predniSONE (DELTASONE) 20 MG tablet Take 2 tablets (40 mg total) by mouth daily with breakfast. 03/05/23  Yes Particia Nearing, PA-C  promethazine-dextromethorphan (PROMETHAZINE-DM) 6.25-15 MG/5ML syrup Take 5 mLs by mouth 4 (four) times daily as needed. 03/05/23  Yes Particia Nearing, PA-C  rosuvastatin (CRESTOR) 5 MG tablet TAKE 1 TABLET DAILY WITH SUPPER 12/03/22  Yes Rakes, Doralee Albino, FNP  Tiotropium Bromide Monohydrate (SPIRIVA RESPIMAT) 2.5 MCG/ACT AERS Inhale 2 puffs into the lungs daily. 03/26/22  Yes [name] F, FNP  colchicine 0.6 MG tablet Take 2 tablets (1.2 mg) by mouth at  onset of gout flare, then 1 tablet (0.6 mg) 1 hour later. 09/17/21   Gwenlyn Fudge, FNP  sildenafil (VIAGRA) 100 MG tablet Take 1 tablet (100 mg total) by  mouth daily as needed for erectile dysfunction. 03/19/21   Gwenlyn Fudge, FNP    Family History Family History  Problem Relation Age of Onset   Hypertension Mother    Prostate cancer Father    Hypotension Father    Colon cancer Paternal Uncle    Diabetes Maternal Grandmother    Lung cancer Maternal Grandfather    Pancreatic cancer Paternal Grandmother    Colon polyps Cousin    Cancer Other        all paternal uncles and anunts passed with cancer    Social History Social History   Tobacco Use   Smoking status: Some  Days    Current packs/day: 0.25    Types: Cigarettes   Smokeless tobacco: Former    Types: Engineer, drilling   Vaping status: Never Used  Substance Use Topics   Alcohol use: Yes    Comment: occas   Drug use: No   Allergies   Penicillins  Review of Systems Review of Systems PER HPI  Physical Exam Triage Vital Signs ED Triage Vitals [03/05/23 1302]  Encounter Vitals Group     BP 120/80     Systolic BP Percentile      Diastolic BP Percentile      Pulse Rate 76     Resp 18     Temp 97.9 F (36.6 C)     Temp Source Oral     SpO2 93 %     Weight      Height      Head Circumference      Peak Flow      Pain Score 0     Pain Loc      Pain Education      Exclude from Growth Chart    No data found.  Updated Vital Signs BP 120/80 (BP Location: Right Arm)   Pulse 76   Temp 97.9 F (36.6 C) (Oral)   Resp 18   SpO2 93%   Visual Acuity Right Eye Distance:   Left Eye Distance:   Bilateral Distance:    Right Eye Near:   Left Eye Near:    Bilateral Near:     Physical Exam Vitals and nursing note reviewed.  Constitutional:      Appearance: He is well-developed.  HENT:     Head: Atraumatic.     Right Ear: External ear normal.     Left Ear: External ear normal.     Nose: Congestion present.     Mouth/Throat:     Mouth: Mucous membranes are moist.     Pharynx: Oropharynx is clear. Posterior oropharyngeal erythema present. No oropharyngeal exudate.  Eyes:     Conjunctiva/sclera: Conjunctivae normal.     Pupils: Pupils are equal, round, and reactive to light.  Cardiovascular:     Rate and Rhythm: Normal rate and regular rhythm.  Pulmonary:     Effort: Pulmonary effort is normal. No respiratory distress.     Breath sounds: Wheezing present. No rales.  Musculoskeletal:        General: Normal range of motion.     Cervical back: Normal range of motion and neck supple.  Lymphadenopathy:     Cervical: No cervical adenopathy.  Skin:    General: Skin is warm and  dry.  Neurological:     Mental Status: He is alert and oriented to person, place, and time.  Psychiatric:        Behavior: Behavior normal.    UC  Treatments / Results  Labs (all labs ordered are listed, but only abnormal results are displayed) Labs Reviewed - No data to display  EKG  Radiology No results found.  Procedures Procedures (including critical care time)  Medications Ordered in UC Medications - No data to display  Initial Impression / Assessment and Plan / UC Course  I have reviewed the triage vital signs and the nursing notes.  Pertinent labs & imaging results that were available during my care of the patient were reviewed by me and considered in my medical decision making (see chart for details).     Given duration of symptoms and worsening course, will treat for sinusitis and COPD exacerbation with prednisone, Zithromax, Phenergan DM.  Discussed continued inhaler regimen, supportive over-the-counter medications and home care.  Return for worsening symptoms.  Final Clinical Impressions(s) / UC Diagnoses   Final diagnoses:  Acute non-recurrent maxillary sinusitis  COPD exacerbation [facility])   Discharge Instructions   None    ED Prescriptions     Medication Sig Dispense Auth. Provider   predniSONE (DELTASONE) 20 MG tablet Take 2 tablets (40 mg total) by mouth daily with breakfast. 10 tablet Particia Nearing, PA-C   azithromycin (ZITHROMAX) 250 MG tablet Take first 2 tablets together, then 1 every day until finished. 6 tablet Particia Nearing, [place]   promethazine-dextromethorphan (PROMETHAZINE-DM) 6.25-15 MG/5ML syrup Take 5 mLs by mouth 4 (four) times daily as needed. 100 mL Particia Nearing, [place]      PDMP not reviewed this encounter.   Particia Nearing, [place] 03/05/23 1336

## 2023-03-05 NOTE — ED Triage Notes (Signed)
Congestion, cough, SOB with exertion, pressure in ears that started a week ago. Taking mucinex with no relief.

## 2023-03-15 ENCOUNTER — Other Ambulatory Visit: Payer: Self-pay | Admitting: Family Medicine

## 2023-03-15 DIAGNOSIS — E1165 Type 2 diabetes mellitus with hyperglycemia: Secondary | ICD-10-CM

## 2023-03-20 ENCOUNTER — Ambulatory Visit (INDEPENDENT_AMBULATORY_CARE_PROVIDER_SITE_OTHER): Payer: BC Managed Care – PPO | Admitting: Family Medicine

## 2023-03-20 ENCOUNTER — Encounter: Payer: Self-pay | Admitting: Family Medicine

## 2023-03-20 VITALS — BP 122/81 | HR 80 | Ht 73.0 in | Wt 262.0 lb

## 2023-03-20 DIAGNOSIS — E1165 Type 2 diabetes mellitus with hyperglycemia: Secondary | ICD-10-CM

## 2023-03-20 DIAGNOSIS — K219 Gastro-esophageal reflux disease without esophagitis: Secondary | ICD-10-CM

## 2023-03-20 DIAGNOSIS — R0981 Nasal congestion: Secondary | ICD-10-CM | POA: Insufficient documentation

## 2023-03-20 DIAGNOSIS — E559 Vitamin D deficiency, unspecified: Secondary | ICD-10-CM

## 2023-03-20 DIAGNOSIS — E1169 Type 2 diabetes mellitus with other specified complication: Secondary | ICD-10-CM | POA: Diagnosis not present

## 2023-03-20 DIAGNOSIS — E785 Hyperlipidemia, unspecified: Secondary | ICD-10-CM

## 2023-03-20 DIAGNOSIS — E1159 Type 2 diabetes mellitus with other circulatory complications: Secondary | ICD-10-CM | POA: Diagnosis not present

## 2023-03-20 DIAGNOSIS — E038 Other specified hypothyroidism: Secondary | ICD-10-CM

## 2023-03-20 DIAGNOSIS — I152 Hypertension secondary to endocrine disorders: Secondary | ICD-10-CM

## 2023-03-20 DIAGNOSIS — Z7984 Long term (current) use of oral hypoglycemic drugs: Secondary | ICD-10-CM

## 2023-03-20 DIAGNOSIS — Z114 Encounter for screening for human immunodeficiency virus [HIV]: Secondary | ICD-10-CM

## 2023-03-20 DIAGNOSIS — J449 Chronic obstructive pulmonary disease, unspecified: Secondary | ICD-10-CM

## 2023-03-20 DIAGNOSIS — Z1159 Encounter for screening for other viral diseases: Secondary | ICD-10-CM

## 2023-03-20 DIAGNOSIS — Z1211 Encounter for screening for malignant neoplasm of colon: Secondary | ICD-10-CM

## 2023-03-20 DIAGNOSIS — R7301 Impaired fasting glucose: Secondary | ICD-10-CM

## 2023-03-20 DIAGNOSIS — M109 Gout, unspecified: Secondary | ICD-10-CM

## 2023-03-20 MED ORDER — AMLODIPINE BESYLATE 10 MG PO TABS
10.0000 mg | ORAL_TABLET | Freq: Every day | ORAL | 1 refills | Status: DC
Start: 2023-03-20 — End: 2023-09-16

## 2023-03-20 MED ORDER — ALLOPURINOL 100 MG PO TABS
200.0000 mg | ORAL_TABLET | Freq: Every day | ORAL | 1 refills | Status: DC
Start: 2023-03-20 — End: 2023-09-16

## 2023-03-20 MED ORDER — METFORMIN HCL ER 500 MG PO TB24
500.0000 mg | ORAL_TABLET | Freq: Two times a day (BID) | ORAL | 0 refills | Status: DC
Start: 2023-03-20 — End: 2024-02-26

## 2023-03-20 MED ORDER — PANTOPRAZOLE SODIUM 40 MG PO TBEC
40.0000 mg | DELAYED_RELEASE_TABLET | Freq: Every day | ORAL | 1 refills | Status: DC
Start: 2023-03-20 — End: 2023-09-16

## 2023-03-20 MED ORDER — SPIRIVA RESPIMAT 2.5 MCG/ACT IN AERS: 2.0000 | INHALATION_SPRAY | Freq: Every day | RESPIRATORY_TRACT | 1 refills | Status: DC

## 2023-03-20 MED ORDER — METFORMIN HCL ER 500 MG PO TB24
500.0000 mg | ORAL_TABLET | Freq: Two times a day (BID) | ORAL | 0 refills | Status: DC
Start: 2023-03-20 — End: 2023-03-20

## 2023-03-20 MED ORDER — LISINOPRIL 40 MG PO TABS
40.0000 mg | ORAL_TABLET | Freq: Every day | ORAL | 1 refills | Status: DC
Start: 2023-03-20 — End: 2023-09-16

## 2023-03-20 MED ORDER — METFORMIN HCL ER 500 MG PO TB24: 500.0000 mg | ORAL_TABLET | Freq: Two times a day (BID) | ORAL | 0 refills | Status: DC

## 2023-03-20 NOTE — Patient Instructions (Addendum)
I appreciate the opportunity to provide care to you today!    Follow up:  4 months  Fasting Labs: please stop by the lab during the week  to get your blood drawn (CBC, CMP, TSH, Lipid profile, HgA1c, Vit D)  Screening: HIV and Hep C   Here are some effective methods manage nasal congestion  -Saline Nasal Irrigation: Using a saline solution to rinse out nasal passages can help reduce congestion and clear out mucus. Devices like neti pots or saline spray bottles are commonly used for this purpose. -Steam Inhalation: Inhaling steam from a bowl of hot water or a warm shower can help loosen mucus and reduce nasal congestion. Adding a few drops of essential oils like eucalyptus or peppermint can provide additional relief. -Humidifiers: Adding moisture to the air with a humidifier can help keep nasal passages moist and reduce congestion. Make sure to keep the humidifier clean to prevent mold and bacteria growth. -Warm Compresses: Applying a warm compress to the face, particularly around the nose and sinuses, can help ease congestion and improve circulation in the area. -Hydration: Drinking plenty of fluids helps to thin mucus and can make it easier to clear from the nasal passages. Water, herbal teas, and clear broths are good options. -Elevate Your Head: Sleeping with your head elevated can help reduce nasal congestion while you rest, allowing mucus to drain more easily. -Avoid Irritants: Staying away from irritants like smoke, strong perfumes, and pollutants can help prevent further irritation of the nasal passages. -Nasal Strips: Adhesive nasal strips can physically open the nasal passages by widening the nostrils, which can improve airflow and reduce congestion. -Warm Fluids: Consuming warm fluids, such as herbal teas or warm water with honey, can help soothe irritated nasal passages and loosen mucus. -Proper Diet: Eating foods that have natural anti-inflammatory properties, like spicy foods, can  temporarily open nasal passages and promote mucus flow.   Please continue to a heart-healthy diet and increase your physical activities. Try to exercise for at least five days a week.    It was a pleasure to see you and I look forward to continuing to work together on your health and well-being. Please do not hesitate to call the office if you need care or have questions about your care.  In case of emergency, please visit the Emergency Department for urgent care, or contact our clinic at [phone] to schedule an appointment. We're here to help you!   Have a wonderful day and week. With Gratitude, Gilmore Laroche MSN, FNP-BC

## 2023-03-20 NOTE — Assessment & Plan Note (Addendum)
The patient is encouraged to continue the treatment regimen as prescribed. Nonpharmacological measures, including nasal saline irrigation, steam inhalation, and the use of a humidifier, were reviewed. The patient declines a referral to ENT at this time.

## 2023-03-20 NOTE — Assessment & Plan Note (Signed)
Encouraged to continue lisinopril 40 mg daily Recommend low-sodium diet with increased physical activity

## 2023-03-20 NOTE — Progress Notes (Addendum)
New Patient Office Visit  Subjective:  Patient ID: Chase Christensen, male    DOB: 1971-05-11  Age: 52 y.o. MRN: [id number]  CC:  Chief Complaint  Patient presents with   Establish Care    New patient establishing care previously seen by [facility]. [facility] however he is almost out of metformin will need that to his local pharmacy.    Sinusitis    Pt reports fight a sinus infection has been to urgent care has taken all meds prescribed still not better. All in all its been going on for about a month now.     HPI Chase Christensen is a 52 y.o. male with past medical history of type 2 diabetes, hypertension, hyperlipidemia presents for establishing care.  Hypertension: The patient is well-controlled on amlodipine 10 mg daily and lisinopril 40 mg daily. He is asymptomatic and reports compliance with his treatment regimen.  Type 2 Diabetes: The patient takes metformin 500 mg  BID and reports treatment compliance. He denies polyuria, polydipsia, and polyphagia.  Hyperlipidemia: The patient takes rosuvastatin 5 mg daily and reports compliance with the treatment. He denies experiencing muscle aches or pain.  Nasal Congestion: The patient has been treated twice for recurrent sinusitis. He was seen in the ED on 03/05/2023 and treated with azithromycin, prednisone, and Promethazine DM. He completed the treatment regimen and was recently seen at urgent care on 03/16/2023 for similar complaints of nasal congestion. He was treated with a corticosteroid nasal spray. The patient continues to experience nasal congestion with postnasal drip. He tested negative for COVID and denies symptoms of fever, headaches, body aches, facial pressure or pain, nausea, vomiting, cough, and sneezing. He notes that working outdoors may be contributing to his symptoms.      Past Medical History:  Diagnosis Date   COPD (chronic obstructive pulmonary disease)  (HCC)    Diabetes (HCC) 09/17/2021   ED (erectile dysfunction)    Gout    Hypertension    Pneumonia    Prediabetes 09/18/2020    Past Surgical History:  Procedure Laterality Date   KNEE ARTHROSCOPY Right 03/15/2015   WISDOM TOOTH EXTRACTION      Family History  Problem Relation Age of Onset   Hypertension Mother    Prostate cancer Father    Hypotension Father    Colon cancer Paternal Uncle    Diabetes Maternal Grandmother    Lung cancer Maternal Grandfather    Pancreatic cancer Paternal Grandmother    Colon polyps Cousin    Cancer Other        all paternal uncles and anunts passed with cancer    Social History   Socioeconomic History   Marital status: Divorced    Spouse name: Not on file   Number of children: 1   Years of education: Not on file   Highest education level: 12th grade  Occupational History   Occupation: servic tech/ [employer]   Occupation: Sports coach  Tobacco Use   Smoking status: Some Days    Current packs/day: 0.25    Types: Cigarettes   Smokeless tobacco: Former    Types: Engineer, drilling   Vaping status: Never Used  Substance and Sexual Activity   Alcohol use: Yes    Comment: occas   Drug use: No   Sexual activity: Yes  Other Topics Concern   Not on file  Social History Narrative   Not on  file   Social Determinants of Health   Financial Resource Strain: Low Risk  (03/16/2023)   Overall Financial Resource Strain (CARDIA)    Difficulty of Paying Living Expenses: Not hard at all  Food Insecurity: No Food Insecurity (03/16/2023)   Hunger Vital Sign    Worried About Running Out of Food in the Last Year: Never true    Ran Out of Food in the Last Year: Never true  Transportation Needs: No Transportation Needs (03/16/2023)   PRAPARE - Administrator, Civil Service (Medical): No    Lack of Transportation (Non-Medical): No  Physical Activity: Insufficiently Active (03/16/2023)   Exercise Vital Sign    Days of Exercise  per Week: 3 days    Minutes of Exercise per Session: 30 min  Stress: No Stress Concern Present (03/16/2023)   [employer] of Occupational Health - Occupational Stress Questionnaire    Feeling of Stress : Not at all  Social Connections: Moderately Integrated (03/16/2023)   Social Connection and Isolation Panel [NHANES]    Frequency of Communication with Friends and Family: More than three times a week    Frequency of Social Gatherings with Friends and Family: More than three times a week    Attends Religious Services: 1 to 4 times per year    Active Member of [employer] or Organizations: Yes    Attends Engineer, structural: More than 4 times per year    Marital Status: Divorced  Catering manager Violence: Not on file    ROS Review of Systems  Constitutional:  Negative for fatigue and fever.  HENT:  Positive for congestion. Negative for ear discharge, facial swelling, hearing loss, rhinorrhea, sinus pressure and sinus pain.   Eyes:  Negative for visual disturbance.  Respiratory:  Negative for chest tightness and shortness of breath.   Cardiovascular:  Negative for chest pain and palpitations.  Neurological:  Negative for dizziness and headaches.    Objective:   Today's Vitals: BP 122/81   Pulse 80   Ht 6\' 1"  (1.854 m)   Wt 262 lb 0.6 oz (118.9 kg)   SpO2 93%   BMI 34.57 kg/m   Physical Exam HENT:     Head: Normocephalic.     Right Ear: External ear normal.     Left Ear: External ear normal.     Nose: No congestion or rhinorrhea.     Mouth/Throat:     Mouth: Mucous membranes are moist.  Cardiovascular:     Rate and Rhythm: Regular rhythm.     Heart sounds: No murmur heard. Pulmonary:     Effort: No respiratory distress.     Breath sounds: Normal breath sounds.  Neurological:     Mental Status: He is alert.      Assessment & Plan:   Hypertension associated with type 2 diabetes mellitus (HCC) Assessment & Plan: Encouraged to continue lisinopril 40 mg  daily Recommend low-sodium diet with increased physical activity  Orders: -     amLODIPine Besylate; Take 1 tablet (10 mg total) by mouth daily.  Dispense: 90 tablet; Refill: 1 -     Lisinopril; Take 1 tablet (40 mg total) by mouth daily.  Dispense: 90 tablet; Refill: 1  Hyperlipidemia associated with type 2 diabetes mellitus (HCC) Assessment & Plan: Encouraged to continue taking rosuvastatin 5 mg daily Recommended decreasing his intake of cholesterol, saturated fats, and trans fat with increased physical activity  Orders: -     Lipid panel -  CMP14+EGFR -     CBC with Differential/Platelet  Nasal congestion Assessment & Plan: The patient is encouraged to continue the treatment regimen as prescribed. Nonpharmacological measures, including nasal saline irrigation, steam inhalation, and the use of a humidifier, were reviewed. The patient declines a referral to ENT at this time.   Type 2 diabetes mellitus with hyperglycemia, without long-term current use of insulin (HCC) Assessment & Plan: Refilled metformin 500 mg BID and encouraged to continue treatment regimen Encouraged decreasing his intake of high sugar foods and beverages with increased physical activity  Orders: -     Microalbumin / creatinine urine ratio -     Lisinopril; Take 1 tablet (40 mg total) by mouth daily.  Dispense: 90 tablet; Refill: 1 -     metFORMIN HCl ER; Take 1 tablet (500 mg total) by mouth 2 (two) times daily with a meal.  Dispense: 90 tablet; Refill: 0 -     metFORMIN HCl ER; Take 1 tablet (500 mg total) by mouth 2 (two) times daily with a meal.  Dispense: 30 tablet; Refill: 0  Colon cancer screening -     Cologuard  IFG (impaired fasting glucose) -     Hemoglobin A1c  Vitamin D deficiency -     VITAMIN D 25 Hydroxy (Vit-D Deficiency, Fractures)  Need for hepatitis C screening test -     Hepatitis C antibody  Encounter for screening for HIV -     HIV Antibody (routine testing w rflx)  Other  specified hypothyroidism -     TSH + free T4  Controlled gout -     Allopurinol; Take 2 tablets (200 mg total) by mouth daily.  Dispense: 180 tablet; Refill: 1  Gastroesophageal reflux disease without esophagitis -     Pantoprazole Sodium; Take 1 tablet (40 mg total) by mouth daily before breakfast.  Dispense: 90 tablet; Refill: 1  COPD without exacerbation (HCC) -     Spiriva Respimat; Inhale 2 puffs into the lungs daily.  Dispense: 12 g; Refill: 1   Note: This chart has been completed using Engineer, civil (consulting) software, and while attempts have been made to ensure accuracy, certain words and phrases may not be transcribed as intended.    Follow-up: Return in about 4 months (around 07/20/2023).   Gilmore Laroche, FNP

## 2023-03-20 NOTE — Assessment & Plan Note (Addendum)
Refilled metformin 500 mg BID and encouraged to continue treatment regimen Encouraged decreasing his intake of high sugar foods and beverages with increased physical activity

## 2023-03-20 NOTE — Assessment & Plan Note (Signed)
Encouraged to continue taking rosuvastatin 5 mg daily Recommended decreasing his intake of cholesterol, saturated fats, and trans fat with increased physical activity

## 2023-03-23 ENCOUNTER — Other Ambulatory Visit: Payer: Self-pay

## 2023-03-23 ENCOUNTER — Encounter: Payer: Self-pay | Admitting: Family Medicine

## 2023-03-23 DIAGNOSIS — Z114 Encounter for screening for human immunodeficiency virus [HIV]: Secondary | ICD-10-CM | POA: Diagnosis not present

## 2023-03-23 DIAGNOSIS — E559 Vitamin D deficiency, unspecified: Secondary | ICD-10-CM | POA: Diagnosis not present

## 2023-03-23 DIAGNOSIS — Z125 Encounter for screening for malignant neoplasm of prostate: Secondary | ICD-10-CM

## 2023-03-23 DIAGNOSIS — E7849 Other hyperlipidemia: Secondary | ICD-10-CM | POA: Diagnosis not present

## 2023-03-23 DIAGNOSIS — E038 Other specified hypothyroidism: Secondary | ICD-10-CM | POA: Diagnosis not present

## 2023-03-23 DIAGNOSIS — R7301 Impaired fasting glucose: Secondary | ICD-10-CM | POA: Diagnosis not present

## 2023-03-23 DIAGNOSIS — Z1159 Encounter for screening for other viral diseases: Secondary | ICD-10-CM | POA: Diagnosis not present

## 2023-03-23 DIAGNOSIS — E1165 Type 2 diabetes mellitus with hyperglycemia: Secondary | ICD-10-CM | POA: Diagnosis not present

## 2023-03-23 NOTE — Telephone Encounter (Signed)
Yes, please.

## 2023-03-23 NOTE — Telephone Encounter (Signed)
Order has been put in

## 2023-03-24 ENCOUNTER — Other Ambulatory Visit: Payer: Self-pay | Admitting: Family Medicine

## 2023-03-24 DIAGNOSIS — R7989 Other specified abnormal findings of blood chemistry: Secondary | ICD-10-CM

## 2023-03-24 NOTE — Progress Notes (Signed)
Please inform the patient that his PSA levels are within the desired limits, but his testosterone levels are low. A referral has been placed to urology for further evaluation and management.

## 2023-03-25 LAB — LIPID PANEL
Chol/HDL Ratio: 2.8 ratio (ref 0.0–5.0)
Cholesterol, Total: 102 mg/dL (ref 100–199)
HDL: 37 mg/dL — ABNORMAL LOW (ref >39)
LDL Chol Calc (NIH): 34 mg/dL (ref 0–99)
Triglycerides: 194 mg/dL — ABNORMAL HIGH (ref 0–149)
VLDL Cholesterol Cal: 31 mg/dL (ref 5–40)

## 2023-03-25 LAB — CBC WITH DIFFERENTIAL/PLATELET
Basophils Absolute: 0.1 10*3/uL (ref 0.0–0.2)
Basos: 1 %
EOS (ABSOLUTE): 0.3 10*3/uL (ref 0.0–0.4)
Eos: 3 %
Hematocrit: 48.6 % (ref 37.5–51.0)
Hemoglobin: 17.1 g/dL (ref 13.0–17.7)
Immature Grans (Abs): 0.1 10*3/uL (ref 0.0–0.1)
Immature Granulocytes: 1 %
Lymphocytes Absolute: 2 10*3/uL (ref 0.7–3.1)
Lymphs: 21 %
MCH: 33.5 pg — ABNORMAL HIGH (ref 26.6–33.0)
MCHC: 35.2 g/dL (ref 31.5–35.7)
MCV: 95 fL (ref 79–97)
Monocytes Absolute: 0.7 10*3/uL (ref 0.1–0.9)
Monocytes: 8 %
Neutrophils Absolute: 6.3 10*3/uL (ref 1.4–7.0)
Neutrophils: 66 %
Platelets: 213 10*3/uL (ref 150–450)
RBC: 5.1 x10E6/uL (ref 4.14–5.80)
RDW: 11.5 % — ABNORMAL LOW (ref 11.6–15.4)
WBC: 9.5 10*3/uL (ref 3.4–10.8)

## 2023-03-25 LAB — CMP14+EGFR
ALT: 33 IU/L (ref 0–44)
AST: 27 IU/L (ref 0–40)
Albumin: 4.2 g/dL (ref 4.1–5.1)
Alkaline Phosphatase: 80 IU/L (ref 44–121)
BUN/Creatinine Ratio: 18 (ref 9–20)
BUN: 19 mg/dL (ref 6–24)
Bilirubin Total: 0.3 mg/dL (ref 0.0–1.2)
CO2: 24 mmol/L (ref 20–29)
Calcium: 9.7 mg/dL (ref 8.7–10.2)
Chloride: 104 mmol/L (ref 96–106)
Creatinine, Ser: 1.03 mg/dL (ref 0.76–1.27)
Globulin, Total: 2.4 g/dL (ref 1.5–4.5)
Glucose: 162 mg/dL — ABNORMAL HIGH (ref 70–99)
Potassium: 4.6 mmol/L (ref 3.5–5.2)
Sodium: 140 mmol/L (ref 134–144)
Total Protein: 6.6 g/dL (ref 6.0–8.5)
eGFR: 88 mL/min/{1.73_m2} (ref >59)

## 2023-03-25 LAB — VITAMIN D 25 HYDROXY (VIT D DEFICIENCY, FRACTURES): Vit D, 25-Hydroxy: 28.6 ng/mL — ABNORMAL LOW (ref 30.0–100.0)

## 2023-03-25 LAB — HEMOGLOBIN A1C
Est. average glucose Bld gHb Est-mCnc: 169 mg/dL
Hgb A1c MFr Bld: 7.5 % — ABNORMAL HIGH (ref 4.8–5.6)

## 2023-03-25 LAB — HIV ANTIBODY (ROUTINE TESTING W REFLEX): HIV Screen 4th Generation wRfx: NONREACTIVE

## 2023-03-25 LAB — MICROALBUMIN / CREATININE URINE RATIO
Creatinine, Urine: 105 mg/dL
Microalb/Creat Ratio: 33 mg/g{creat} — ABNORMAL HIGH (ref 0–29)
Microalbumin, Urine: 34.3 ug/mL

## 2023-03-25 LAB — HEPATITIS C ANTIBODY: Hep C Virus Ab: NONREACTIVE

## 2023-03-25 LAB — TSH+FREE T4
Free T4: 1.32 ng/dL (ref 0.82–1.77)
TSH: 2.67 u[IU]/mL (ref 0.450–4.500)

## 2023-03-27 ENCOUNTER — Ambulatory Visit: Payer: BC Managed Care – PPO | Admitting: Family Medicine

## 2023-03-27 LAB — TESTOSTERONE,FREE AND TOTAL
Testosterone, Free: 6 pg/mL — ABNORMAL LOW (ref 7.2–24.0)
Testosterone: 262 ng/dL — ABNORMAL LOW (ref 264–916)

## 2023-03-27 LAB — PSA: Prostate Specific Ag, Serum: 1.6 ng/mL (ref 0.0–4.0)

## 2023-03-28 ENCOUNTER — Other Ambulatory Visit: Payer: Self-pay | Admitting: Family Medicine

## 2023-03-28 DIAGNOSIS — E559 Vitamin D deficiency, unspecified: Secondary | ICD-10-CM

## 2023-03-28 MED ORDER — VITAMIN D (ERGOCALCIFEROL) 1.25 MG (50000 UNIT) PO CAPS
50000.0000 [IU] | ORAL_CAPSULE | ORAL | 1 refills | Status: DC
Start: 2023-03-28 — End: 2024-01-25
  Filled 2023-10-02: qty 12, 84d supply, fill #0
  Filled 2024-01-10: qty 4, 28d supply, fill #1

## 2023-03-28 NOTE — Progress Notes (Signed)
I encouraged the patient to start taking metformin 500 mg in the morning and 1000 mg at bedtime to help manage his type 2 diabetes.

## 2023-04-01 ENCOUNTER — Ambulatory Visit: Payer: BC Managed Care – PPO

## 2023-04-16 ENCOUNTER — Ambulatory Visit
Admission: EM | Admit: 2023-04-16 | Discharge: 2023-04-16 | Disposition: A | Discharge status: Home / Self Care | Payer: BC Managed Care – PPO | Attending: Nurse Practitioner | Admitting: Nurse Practitioner

## 2023-04-16 DIAGNOSIS — J069 Acute upper respiratory infection, unspecified: Secondary | ICD-10-CM

## 2023-04-16 LAB — POCT INFLUENZA A/B
Influenza A, POC: NEGATIVE
Influenza B, POC: NEGATIVE

## 2023-04-16 MED ORDER — PROMETHAZINE-DM 6.25-15 MG/5ML PO SYRP: 5.0000 mL | ORAL_SOLUTION | Freq: Four times a day (QID) | ORAL | 0 refills | Status: DC | PRN

## 2023-04-16 MED ORDER — ALBUTEROL SULFATE HFA 108 (90 BASE) MCG/ACT IN AERS: 2.0000 | INHALATION_SPRAY | Freq: Four times a day (QID) | RESPIRATORY_TRACT | 0 refills | Status: DC | PRN

## 2023-04-16 NOTE — Discharge Instructions (Addendum)
The influenza test was negative. Take medication as prescribed. Continue over-the-counter Advil or Tylenol as needed for pain, fever, or general discomfort. May use normal saline nasal spray throughout the day to help with nasal congestion. Recommend use of a humidifier in your bedroom at nighttime during sleep and sleeping elevated on pillows while cough symptoms persist. Make sure you are getting plenty of rest and drinking plenty of fluids. As discussed, this appears to be a viral illness.  If your symptoms worsen to include high fever, chills, wheezing, difficulty breathing, or other concerns, you may follow-up in this clinic or with your primary care physician for further evaluation. Follow-up as needed.

## 2023-04-16 NOTE — ED Provider Notes (Signed)
[facility]    CSN: [id number] Arrival date & time: 04/16/23  1809      History   Chief Complaint No chief complaint on file.   HPI Chase Christensen is a 52 y.o. male.   The history is provided by the patient.   Patient presents for complaints of fever and cough that started this morning upon wakening.  Patient also complains of low back pain.  Tmax around 101.  He denies headache, sore throat, ear pain, wheezing, difficulty breathing, chest pain, abdominal pain, nausea, vomiting, or diarrhea.  Patient reports that he did go out of town this weekend, but denies any obvious known sick contacts.  Reports he did take Advil for his symptoms.  Patient states he took a home COVID test today which was negative. Past Medical History:  Diagnosis Date   COPD (chronic obstructive pulmonary disease) (HCC)    Diabetes (HCC) 09/17/2021   ED (erectile dysfunction)    Gout    Hypertension    Pneumonia    Prediabetes 09/18/2020    Patient Active Problem List   Diagnosis Date Noted   Nasal congestion 03/20/2023   Hyperlipidemia associated with type 2 diabetes mellitus (HCC) 03/20/2023   Nocturnal hypoxia 08/25/2022   Mediastinal lymphadenopathy 05/07/2022   Type 2 diabetes mellitus with hyperglycemia, without long-term current use of insulin (HCC) 09/17/2021   COPD without exacerbation (HCC) 03/19/2021   Controlled gout 09/13/2020   Gastroesophageal reflux disease 08/27/2017   Erectile dysfunction associated with type 2 diabetes mellitus (HCC) 06/08/2015   Hypertension associated with type 2 diabetes mellitus (HCC) 05/03/2015   Tobacco use 05/03/2015    Past Surgical History:  Procedure Laterality Date   KNEE ARTHROSCOPY Right 03/15/2015   WISDOM TOOTH EXTRACTION         Home Medications    Prior to Admission medications   Medication Sig Start Date End Date Taking? Authorizing Provider  albuterol (VENTOLIN HFA) 108 (90 Base) MCG/ACT inhaler Inhale 2 puffs into the  lungs every 6 (six) hours as needed for wheezing or shortness of breath. 04/16/23  Yes Artice Bergerson-Warren, Sadie Haber, NP  promethazine-dextromethorphan (PROMETHAZINE-DM) 6.25-15 MG/5ML syrup Take 5 mLs by mouth 4 (four) times daily as needed for cough. 04/16/23  Yes Kimila Papaleo-Warren, Sadie Haber, NP  allopurinol (ZYLOPRIM) 100 MG tablet Take 2 tablets (200 mg total) by mouth daily. 03/20/23   Gilmore Laroche, FNP  amLODipine (NORVASC) 10 MG tablet Take 1 tablet (10 mg total) by mouth daily. 03/20/23   Gilmore Laroche, FNP  colchicine 0.6 MG tablet Take 2 tablets (1.2 mg) by mouth at  onset of gout flare, then 1 tablet (0.6 mg) 1 hour later. 09/17/21   Gwenlyn Fudge, FNP  lisinopril (ZESTRIL) 40 MG tablet Take 1 tablet (40 mg total) by mouth daily. 03/20/23   Gilmore Laroche, FNP  metFORMIN (GLUCOPHAGE-XR) 500 MG 24 hr tablet Take 1 tablet (500 mg total) by mouth 2 (two) times daily with a meal. 03/20/23   Gilmore Laroche, FNP  metFORMIN (GLUCOPHAGE-XR) 500 MG 24 hr tablet Take 1 tablet (500 mg total) by mouth 2 (two) times daily with a meal. 03/20/23   Gilmore Laroche, FNP  pantoprazole (PROTONIX) 40 MG tablet Take 1 tablet (40 mg total) by mouth daily before breakfast. 03/20/23   Gilmore Laroche, FNP  rosuvastatin (CRESTOR) 5 MG tablet TAKE 1 TABLET DAILY WITH SUPPER 12/03/22   Sonny Masters, FNP  sildenafil (VIAGRA) 100 MG tablet Take 1 tablet (100 mg total) by mouth  daily as needed for erectile dysfunction. 03/19/21   Gwenlyn Fudge, FNP  Tiotropium Bromide Monohydrate (SPIRIVA RESPIMAT) 2.5 MCG/ACT AERS Inhale 2 puffs into the lungs daily. 03/20/23   Gilmore Laroche, FNP  Vitamin D, Ergocalciferol, (DRISDOL) 1.25 MG (50000 UNIT) CAPS capsule Take 1 capsule (50,000 Units total) by mouth every 7 (seven) days. 03/28/23   Gilmore Laroche, FNP    Family History Family History  Problem Relation Age of Onset   Hypertension Mother    Prostate cancer Father    Hypotension Father    Colon cancer Paternal Uncle     Diabetes Maternal Grandmother    Lung cancer Maternal Grandfather    Pancreatic cancer Paternal Grandmother    Colon polyps Cousin    Cancer Other        all paternal uncles and anunts passed with cancer    Social History Social History   Tobacco Use   Smoking status: Some Days    Current packs/day: 0.25    Types: Cigarettes   Smokeless tobacco: Former    Types: Engineer, drilling   Vaping status: Never Used  Substance Use Topics   Alcohol use: Yes    Comment: occas   Drug use: No     Allergies   Penicillins   Review of Systems Review of Systems Per HPI  Physical Exam Triage Vital Signs ED Triage Vitals [04/16/23 1832]  Encounter Vitals Group     BP 117/79     Systolic BP Percentile      Diastolic BP Percentile      Pulse Rate 86     Resp 20     Temp 98.3 F (36.8 C)     Temp Source Oral     SpO2 91 %     Weight      Height      Head Circumference      Peak Flow      Pain Score 0     Pain Loc      Pain Education      Exclude from Growth Chart    No data found.  Updated Vital Signs BP 117/79 (BP Location: Right Arm)   Pulse 86   Temp 98.3 F (36.8 C) (Oral)   Resp 20   SpO2 91%   Visual Acuity Right Eye Distance:   Left Eye Distance:   Bilateral Distance:    Right Eye Near:   Left Eye Near:    Bilateral Near:     Physical Exam Vitals and nursing note reviewed.  Constitutional:      General: He is not in acute distress.    Appearance: Normal appearance.  HENT:     Head: Normocephalic.     Right Ear: Tympanic membrane, ear canal and external ear normal.     Left Ear: Tympanic membrane, ear canal and external ear normal.     Nose: Nose normal.     Right Turbinates: Enlarged and swollen.     Left Turbinates: Enlarged and swollen.     Right Sinus: No maxillary sinus tenderness or frontal sinus tenderness.     Left Sinus: No maxillary sinus tenderness or frontal sinus tenderness.     Mouth/Throat:     Lips: Pink.     Mouth: Mucous  membranes are moist.     Pharynx: Oropharynx is clear. Uvula midline. Postnasal drip present. No pharyngeal swelling, oropharyngeal exudate, posterior oropharyngeal erythema or uvula swelling.  Eyes:     Extraocular Movements: Extraocular  movements intact.     Conjunctiva/sclera: Conjunctivae normal.     Pupils: Pupils are equal, round, and reactive to light.  Cardiovascular:     Rate and Rhythm: Normal rate and regular rhythm.     Pulses: Normal pulses.     Heart sounds: Normal heart sounds.  Pulmonary:     Effort: Pulmonary effort is normal.     Breath sounds: Normal breath sounds. No wheezing.  Abdominal:     General: Bowel sounds are normal.     Palpations: Abdomen is soft.     Tenderness: There is no abdominal tenderness.  Musculoskeletal:     Cervical back: Normal range of motion.  Lymphadenopathy:     Cervical: No cervical adenopathy.  Skin:    General: Skin is warm and dry.  Neurological:     General: No focal deficit present.     Mental Status: He is alert and oriented to person, place, and time.  Psychiatric:        Mood and Affect: Mood normal.        Behavior: Behavior normal.      UC Treatments / Results  Labs (all labs ordered are listed, but only abnormal results are displayed) Labs Reviewed  POCT INFLUENZA A/B    EKG   Radiology No results found.  Procedures Procedures (including critical care time)  Medications Ordered in UC Medications - No data to display  Initial Impression / Assessment and Plan / UC Course  I have reviewed the triage vital signs and the nursing notes.  Pertinent labs & imaging results that were available during my care of the patient were reviewed by me and considered in my medical decision making (see chart for details).  The patient is well-appearing, he is in no acute distress, vital signs are stable.  Symptoms consistent with a viral upper respiratory infection with cough.  Influenza test was negative, patient  declines further COVID testing.  Will provide symptomatic treatment with Promethazine DM for his cough, and an albuterol inhaler as needed for worsening cough or shortness of breath.  Supportive care recommendations were provided and discussed with the patient to include increasing fluids, allowing for plenty of rest, over-the-counter analgesics, and use of a humidifier at night during sleep.  Patient was given strict indications of when follow-up be necessary.  Patient is in agreement with this plan of care and verbalizes understanding.  All questions were answered.  Patient stable for discharge.  Final Clinical Impressions(s) / UC Diagnoses   Final diagnoses:  Viral upper respiratory tract infection with cough     Discharge Instructions      The influenza test was negative. Take medication as prescribed. Continue over-the-counter Advil or Tylenol as needed for pain, fever, or general discomfort. May use normal saline nasal spray throughout the day to help with nasal congestion. Recommend use of a humidifier in your bedroom at nighttime during sleep and sleeping elevated on pillows while cough symptoms persist. Make sure you are getting plenty of rest and drinking plenty of fluids. As discussed, this appears to be a viral illness.  If your symptoms worsen to include high fever, chills, wheezing, difficulty breathing, or other concerns, you may follow-up in this clinic or with your primary care physician for further evaluation. Follow-up as needed.     ED Prescriptions     Medication Sig Dispense Auth. Provider   promethazine-dextromethorphan (PROMETHAZINE-DM) 6.25-15 MG/5ML syrup Take 5 mLs by mouth 4 (four) times daily as needed for cough. 118  mL Anju Sereno-Warren, Sadie Haber, NP   albuterol (VENTOLIN HFA) 108 (90 Base) MCG/ACT inhaler Inhale 2 puffs into the lungs every 6 (six) hours as needed for wheezing or shortness of breath. 8 g Crystle Carelli-Warren, Sadie Haber, NP      PDMP not reviewed  this encounter.   Abran Cantor, NP 04/16/23 1921

## 2023-04-16 NOTE — ED Triage Notes (Signed)
Pt reports he has a fever and cough since this morning. Has been feeling fatigue x 1 day.    Took advil

## 2023-04-17 ENCOUNTER — Encounter: Payer: Self-pay | Admitting: Family Medicine

## 2023-04-17 ENCOUNTER — Ambulatory Visit: Payer: BC Managed Care – PPO | Admitting: Family Medicine

## 2023-04-17 VITALS — BP 133/72 | HR 92 | Temp 99.0°F | Ht 75.0 in | Wt 261.0 lb

## 2023-04-17 DIAGNOSIS — J069 Acute upper respiratory infection, unspecified: Secondary | ICD-10-CM | POA: Diagnosis not present

## 2023-04-17 MED ORDER — PREDNISONE 20 MG PO TABS: 20.0000 mg | ORAL_TABLET | Freq: Two times a day (BID) | ORAL | 0 refills | Status: AC

## 2023-04-17 MED ORDER — LIDOCAINE VISCOUS HCL 2 % MT SOLN
15.0000 mL | OROMUCOSAL | 0 refills | Status: DC | PRN
Start: 2023-04-17 — End: 2024-01-13

## 2023-04-17 MED ORDER — BENZONATATE 200 MG PO CAPS
200.0000 mg | ORAL_CAPSULE | Freq: Two times a day (BID) | ORAL | 0 refills | Status: DC | PRN
Start: 2023-04-17 — End: 2023-07-20

## 2023-04-17 MED ORDER — AZITHROMYCIN 250 MG PO TABS
ORAL_TABLET | ORAL | 0 refills | Status: DC
Start: 2023-04-17 — End: 2023-04-27

## 2023-04-17 NOTE — Assessment & Plan Note (Signed)
Azithromycin 250 mg twice daily x 5 days Prednisone 20 mg twice day x 5 days Benzonatate 200 mg PRN Advise patient to rest to support your body's recovery. Stay hydrated by drinking water, tea, or broth. Using a humidifier can help soothe throat irritation and ease nasal congestion. For fever or pain, acetaminophen (Tylenol) is recommended. To relieve other symptoms, try saline nasal sprays, throat lozenges, or gargling with saltwater. Focus on eating light, healthy meals like fruits and vegetables to keep your strength up. Practice good hygiene by washing your hands frequently and covering your mouth when coughing or sneezing.Follow-up for worsening or persistent symptoms. Patient verbalizes understanding regarding plan of care and all questions answered

## 2023-04-17 NOTE — Progress Notes (Signed)
Patient Office Visit   Subjective   Patient ID: Chase Christensen, male    DOB: 03-18-1971  Age: 52 y.o. MRN: [id number]  CC:  Chief Complaint  Patient presents with   Nasal Congestion    Patient complains of sore throat, productive cough, SOB starting yesterday. Took flu and covid tests with negative results.     HPI Chase Christensen 52 year old male, presents to the clinic for worsening SOB sore throat and productive cough starting yesterday.  He  has a past medical history of COPD (chronic obstructive pulmonary disease) (HCC), Diabetes (HCC) (09/17/2021), ED (erectile dysfunction), Gout, Hypertension, Pneumonia, and Prediabetes (09/18/2020).  The patient reports a productive cough with lime-green sputum, along with symptoms of chest congestion, shortness of breath with exertion, chills accompanied by rigors, fatigue, headache, malaise, muscle aches, and a sore throat. These symptoms began 2 days ago and have been progressively worsening. The patient denies any chest pain, nausea, or vomiting. Thus far, she has used prescribed inhalers, over-the-counter analgesics/antipyretics and antitussives with minimal relief. Her past pulmonary history is notable for bronchitis and COPD. At home Covid and Flu test negative         Outpatient Encounter Medications as of 04/17/2023  Medication Sig   albuterol (VENTOLIN HFA) 108 (90 Base) MCG/ACT inhaler Inhale 2 puffs into the lungs every 6 (six) hours as needed for wheezing or shortness of breath.   allopurinol (ZYLOPRIM) 100 MG tablet Take 2 tablets (200 mg total) by mouth daily.   amLODipine (NORVASC) 10 MG tablet Take 1 tablet (10 mg total) by mouth daily.   azithromycin (ZITHROMAX) 250 MG tablet Take 2 tablets on day 1, then 1 tablet daily on days 2 through 5   benzonatate (TESSALON) 200 MG capsule Take 1 capsule (200 mg total) by mouth 2 (two) times daily as needed for cough.   [employer] BUDESONIDE 32 MCG/ACT nasal spray Place into both nostrils.    ipratropium (ATROVENT) 0.03 % nasal spray PLEASE SEE ATTACHED FOR DETAILED DIRECTIONS   lisinopril (ZESTRIL) 40 MG tablet Take 1 tablet (40 mg total) by mouth daily.   metFORMIN (GLUCOPHAGE-XR) 500 MG 24 hr tablet Take 1 tablet (500 mg total) by mouth 2 (two) times daily with a meal.   metFORMIN (GLUCOPHAGE-XR) 500 MG 24 hr tablet Take 1 tablet (500 mg total) by mouth 2 (two) times daily with a meal.   pantoprazole (PROTONIX) 40 MG tablet Take 1 tablet (40 mg total) by mouth daily before breakfast.   predniSONE (DELTASONE) 20 MG tablet Take 1 tablet (20 mg total) by mouth 2 (two) times daily with a meal for 5 days.   promethazine-dextromethorphan (PROMETHAZINE-DM) 6.25-15 MG/5ML syrup Take 5 mLs by mouth 4 (four) times daily as needed for cough.   rosuvastatin (CRESTOR) 5 MG tablet TAKE 1 TABLET DAILY WITH SUPPER   sildenafil (VIAGRA) 100 MG tablet Take 1 tablet (100 mg total) by mouth daily as needed for erectile dysfunction.   Tiotropium Bromide Monohydrate (SPIRIVA RESPIMAT) 2.5 MCG/ACT AERS Inhale 2 puffs into the lungs daily.   Vitamin D, Ergocalciferol, (DRISDOL) 1.25 MG (50000 UNIT) CAPS capsule Take 1 capsule (50,000 Units total) by mouth every 7 (seven) days.   [DISCONTINUED] colchicine 0.6 MG tablet Take 2 tablets (1.2 mg) by mouth at  onset of gout flare, then 1 tablet (0.6 mg) 1 hour later.   No facility-administered encounter medications on file as of 04/17/2023.    Past Surgical History:  Procedure Laterality Date  KNEE ARTHROSCOPY Right 03/15/2015   WISDOM TOOTH EXTRACTION      Review of Systems  HENT:  Positive for congestion and sore throat.   Eyes:  Negative for blurred vision.  Respiratory:  Positive for cough, sputum production, shortness of breath and wheezing. Negative for hemoptysis.   Cardiovascular:  Negative for chest pain.  Neurological:  Negative for dizziness and headaches.      Objective    BP 133/72   Pulse 92   Temp 99 F (37.2 C)   Ht 6\' 3"  (1.905  m)   Wt 261 lb (118.4 kg)   SpO2 90%   BMI 32.62 kg/m   Physical Exam Vitals reviewed.  Constitutional:      General: He is not in acute distress.    Appearance: Normal appearance. He is not ill-appearing, toxic-appearing or diaphoretic.  HENT:     Head: Normocephalic.     Right Ear: Tympanic membrane normal.     Left Ear: Tympanic membrane normal.     Mouth/Throat:     Pharynx: Posterior oropharyngeal erythema present.  Eyes:     General:        Right eye: No discharge.        Left eye: No discharge.     Conjunctiva/sclera: Conjunctivae normal.  Cardiovascular:     Rate and Rhythm: Normal rate.     Pulses: Normal pulses.     Heart sounds: Normal heart sounds.  Pulmonary:     Effort: No respiratory distress.     Breath sounds: Wheezing and rhonchi present.  Musculoskeletal:        General: Normal range of motion.     Cervical back: Tenderness present.  Lymphadenopathy:     Cervical: Cervical adenopathy present.  Skin:    General: Skin is warm and dry.     Capillary Refill: Capillary refill takes less than 2 seconds.  Neurological:     General: No focal deficit present.     Mental Status: He is alert and oriented to person, place, and time.     Coordination: Coordination normal.     Gait: Gait normal.  Psychiatric:        Mood and Affect: Mood normal.        Behavior: Behavior normal.       Assessment & Plan:  Upper respiratory tract infection, unspecified type Assessment & Plan: Azithromycin 250 mg twice daily x 5 days Prednisone 20 mg twice day x 5 days Benzonatate 200 mg PRN Advise patient to rest to support your body's recovery. Stay hydrated by drinking water, tea, or broth. Using a humidifier can help soothe throat irritation and ease nasal congestion. For fever or pain, acetaminophen (Tylenol) is recommended. To relieve other symptoms, try saline nasal sprays, throat lozenges, or gargling with saltwater. Focus on eating light, healthy meals like fruits and  vegetables to keep your strength up. Practice good hygiene by washing your hands frequently and covering your mouth when coughing or sneezing.Follow-up for worsening or persistent symptoms. Patient verbalizes understanding regarding plan of care and all questions answered    Orders: -     predniSONE; Take 1 tablet (20 mg total) by mouth 2 (two) times daily with a meal for 5 days.  Dispense: 10 tablet; Refill: 0 -     Azithromycin; Take 2 tablets on day 1, then 1 tablet daily on days 2 through 5  Dispense: 6 tablet; Refill: 0 -     Benzonatate; Take 1 capsule (200 mg total)  by mouth 2 (two) times daily as needed for cough.  Dispense: 20 capsule; Refill: 0    Return if symptoms worsen or fail to improve.   Cruzita Lederer Newman Nip, FNP

## 2023-04-17 NOTE — Patient Instructions (Addendum)
        Great to see you today.  I have refilled the medication(s) we provide.    - Please take medications as prescribed. - Follow up with your primary health provider if any health concerns arises. - If symptoms worsen please contact your primary care provider and/or visit the emergency department.  

## 2023-04-22 ENCOUNTER — Encounter: Payer: Self-pay | Admitting: Family Medicine

## 2023-04-22 NOTE — Telephone Encounter (Signed)
That's fine can type work note for patient  Thanks

## 2023-04-27 ENCOUNTER — Ambulatory Visit (INDEPENDENT_AMBULATORY_CARE_PROVIDER_SITE_OTHER): Payer: BC Managed Care – PPO | Admitting: Urology

## 2023-04-27 ENCOUNTER — Ambulatory Visit: Payer: BC Managed Care – PPO | Admitting: Internal Medicine

## 2023-04-27 ENCOUNTER — Encounter: Payer: Self-pay | Admitting: Internal Medicine

## 2023-04-27 VITALS — BP 120/71 | HR 81 | Temp 98.3°F | Resp 16 | Ht 75.0 in | Wt 257.8 lb

## 2023-04-27 VITALS — BP 125/76 | HR 73

## 2023-04-27 DIAGNOSIS — R7989 Other specified abnormal findings of blood chemistry: Secondary | ICD-10-CM | POA: Diagnosis not present

## 2023-04-27 DIAGNOSIS — N521 Erectile dysfunction due to diseases classified elsewhere: Secondary | ICD-10-CM

## 2023-04-27 DIAGNOSIS — Z7984 Long term (current) use of oral hypoglycemic drugs: Secondary | ICD-10-CM

## 2023-04-27 DIAGNOSIS — J441 Chronic obstructive pulmonary disease with (acute) exacerbation: Secondary | ICD-10-CM

## 2023-04-27 DIAGNOSIS — E1169 Type 2 diabetes mellitus with other specified complication: Secondary | ICD-10-CM | POA: Diagnosis not present

## 2023-04-27 DIAGNOSIS — Z720 Tobacco use: Secondary | ICD-10-CM

## 2023-04-27 DIAGNOSIS — E1165 Type 2 diabetes mellitus with hyperglycemia: Secondary | ICD-10-CM

## 2023-04-27 MED ORDER — METHYLPREDNISOLONE ACETATE 80 MG/ML IJ SUSP
80.0000 mg | Freq: Once | INTRAMUSCULAR | Status: AC
Start: 2023-04-27 — End: 2023-04-27
  Administered 2023-04-27: 80 mg via INTRAMUSCULAR

## 2023-04-27 MED ORDER — PREDNISONE 10 MG (21) PO TBPK
ORAL_TABLET | ORAL | 0 refills | Status: DC
Start: 2023-04-27 — End: 2023-05-05

## 2023-04-27 MED ORDER — GUAIFENESIN-CODEINE 100-10 MG/5ML PO SYRP: 5.0000 mL | ORAL_SOLUTION | Freq: Three times a day (TID) | ORAL | 0 refills | Status: DC | PRN

## 2023-04-27 MED ORDER — LEVOFLOXACIN 500 MG PO TABS
500.0000 mg | ORAL_TABLET | Freq: Every day | ORAL | 0 refills | Status: AC
Start: 2023-04-27 — End: 2023-05-04

## 2023-04-27 NOTE — Progress Notes (Unsigned)
04/27/2023 10:09 AM   Chase Christensen [birth year] [id number]  Referring provider: Gilmore Laroche, FNP [address] [place],  [place] [address]  No chief complaint on file.   HPI:  New pt -   1) low T - pt with a T of 262 in sep 2024. Free 6, PSA 1.6, Hct 48.6. Symptoms of fatigue, weight gain (although lost 10 lbs over past few weeks due to cough, congestion, "cold" symptoms), type II DM, low libido. He has ED and tried sildenafil 100 mg.   He has a child. No scrotal or inguinal surgery.   He works at [employer].    PMH: Past Medical History:  Diagnosis Date   COPD (chronic obstructive pulmonary disease) (HCC)    Diabetes (HCC) 09/17/2021   ED (erectile dysfunction)    Gout    Hypertension    Pneumonia    Prediabetes 09/18/2020    Surgical History: Past Surgical History:  Procedure Laterality Date   KNEE ARTHROSCOPY Right 03/15/2015   WISDOM TOOTH EXTRACTION      Home Medications:  Allergies as of 04/27/2023       Reactions   Penicillins Rash        Medication List        Accurate as of April 27, 2023 10:09 AM. If you have any questions, ask your nurse or doctor.          albuterol 108 (90 Base) MCG/ACT inhaler Commonly known as: VENTOLIN HFA Inhale 2 puffs into the lungs every 6 (six) hours as needed for wheezing or shortness of breath.   allopurinol 100 MG tablet Commonly known as: ZYLOPRIM Take 2 tablets (200 mg total) by mouth daily.   amLODipine 10 MG tablet Commonly known as: NORVASC Take 1 tablet (10 mg total) by mouth daily.   azithromycin 250 MG tablet Commonly known as: ZITHROMAX Take 2 tablets on day 1, then 1 tablet daily on days 2 through 5   benzonatate 200 MG capsule Commonly known as: TESSALON Take 1 capsule (200 mg total) by mouth 2 (two) times daily as needed for cough.   [employer] Budesonide 32 MCG/ACT nasal spray Generic drug: budesonide Place into both nostrils.   ipratropium 0.03 % nasal spray Commonly  known as: ATROVENT PLEASE SEE ATTACHED FOR DETAILED DIRECTIONS   lidocaine 2 % solution Commonly known as: XYLOCAINE Use as directed 15 mLs in the mouth or throat as needed.   lisinopril 40 MG tablet Commonly known as: ZESTRIL Take 1 tablet (40 mg total) by mouth daily.   metFORMIN 500 MG 24 hr tablet Commonly known as: GLUCOPHAGE-XR Take 1 tablet (500 mg total) by mouth 2 (two) times daily with a meal.   metFORMIN 500 MG 24 hr tablet Commonly known as: GLUCOPHAGE-XR Take 1 tablet (500 mg total) by mouth 2 (two) times daily with a meal.   pantoprazole 40 MG tablet Commonly known as: PROTONIX Take 1 tablet (40 mg total) by mouth daily before breakfast.   promethazine-dextromethorphan 6.25-15 MG/5ML syrup Commonly known as: PROMETHAZINE-DM Take 5 mLs by mouth 4 (four) times daily as needed for cough.   rosuvastatin 5 MG tablet Commonly known as: CRESTOR TAKE 1 TABLET DAILY WITH SUPPER   sildenafil 100 MG tablet Commonly known as: VIAGRA Take 1 tablet (100 mg total) by mouth daily as needed for erectile dysfunction.   Spiriva Respimat 2.5 MCG/ACT Aers Generic drug: Tiotropium Bromide Monohydrate Inhale 2 puffs into the lungs daily.   Vitamin D (Ergocalciferol) 1.25  MG (50000 UNIT) Caps capsule Commonly known as: DRISDOL Take 1 capsule (50,000 Units total) by mouth every 7 (seven) days.        Allergies:  Allergies  Allergen Reactions   Penicillins Rash    Family History: Family History  Problem Relation Age of Onset   Hypertension Mother    Prostate cancer Father    Hypotension Father    Colon cancer Paternal Uncle    Diabetes Maternal Grandmother    Lung cancer Maternal Grandfather    Pancreatic cancer Paternal Grandmother    Colon polyps Cousin    Cancer Other        all paternal uncles and anunts passed with cancer    Social History:  reports that he has been smoking cigarettes. He has quit using smokeless tobacco.  His smokeless tobacco use  included chew. He reports current alcohol use. He reports that he does not use drugs.   Physical Exam: BP 125/76   Pulse 73   Constitutional:  Alert and oriented, No acute distress. HEENT: [place] AT, moist mucus membranes.  Trachea midline, no masses. Cardiovascular: No clubbing, cyanosis, or edema. Respiratory: Normal respiratory effort, no increased work of breathing. GI: Abdomen is soft, nontender, nondistended, no abdominal masses GU: No CVA tenderness Skin: No rashes, bruises or suspicious lesions. Neurologic: Grossly intact, no focal deficits, moving all 4 extremities. Psychiatric: Normal mood and affect. GU: Penis circumcised, normal foreskin, testicles descended bilaterally and palpably normal, bilateral epididymis palpably normal, scrotum normal DRE: Prostate 30 g, smooth without hard area or nodule   Laboratory Data: Lab Results  Component Value Date   WBC 9.5 03/23/2023   HGB 17.1 03/23/2023   HCT 48.6 03/23/2023   MCV 95 03/23/2023   PLT 213 03/23/2023    Lab Results  Component Value Date   CREATININE 1.03 03/23/2023    No results found for: "PSA"  Lab Results  Component Value Date   TESTOSTERONE 262 (L) 03/23/2023    Lab Results  Component Value Date   HGBA1C 7.5 (H) 03/23/2023    Urinalysis    Component Value Date/Time   COLORURINE COLORLESS (A) 12/23/2021 2328   APPEARANCEUR Clear 07/30/2022 1512   LABSPEC 1.033 (H) 12/23/2021 2328   PHURINE 5.0 12/23/2021 2328   GLUCOSEU Negative 07/30/2022 1512   HGBUR NEGATIVE 12/23/2021 2328   BILIRUBINUR Negative 07/30/2022 1512   KETONESUR negative 06/26/2022 1733   KETONESUR NEGATIVE 12/23/2021 2328   PROTEINUR Negative 07/30/2022 1512   PROTEINUR NEGATIVE 12/23/2021 2328   UROBILINOGEN 0.2 06/26/2022 1733   NITRITE Negative 07/30/2022 1512   NITRITE NEGATIVE 12/23/2021 2328   LEUKOCYTESUR Negative 07/30/2022 1512   LEUKOCYTESUR NEGATIVE 12/23/2021 2328    Lab Results  Component Value Date    LABMICR 34.3 03/23/2023   WBCUA 0-5 07/30/2022   RBCUA 0-2 08/12/2017   LABEPIT None seen 07/30/2022   MUCUS Present 08/12/2017   BACTERIA Few (A) 07/30/2022    Pertinent Imaging: N/a   Assessment & Plan:    1. Low testosterone in male - benign exam today. Testicles with good volume. Might be a clomid candidate. Check T today and then confirmatory labs if needed. Discussed the nature r/b/a to T replacement including DVT/PE, MACE among others. All questions answered.   2. Erectile dysfunction - cont pde5i  No follow-ups on file.  Jerilee Field, MD  [facility]  [address] [place], [place] [address] [phone]

## 2023-04-27 NOTE — Progress Notes (Signed)
Acute Office Visit  Subjective:    Patient ID: Chase Christensen, male    DOB: 1971/06/18, 52 y.o.   MRN: [id number]  Chief Complaint  Patient presents with   Cough    Was seen last Friday and took a zpak and prednisone and still having bad coughing fits and started gradually getting shortness of breath. Didn't feel much better after the meds. Still having bad coughing spells and still has tightness in chest     HPI Patient is in today for evaluation of persistent cough, recent worsening of dyspnea and wheezing for the last 2 weeks.  He was seen in the last week by Rica Records, NP, and was given azithromycin and oral prednisone.  He felt slight improvement, but has had persistent symptoms.  He also reports sore throat and chest congestion.  He has history of COPD, but reports that he has not smoked in the last 4 days.  He has had pulmonology evaluation and has been given Stiolto, but compliance is questionable as he still has samples from 04/24.  Denies any hemoptysis, fever or chills recently.  Past Medical History:  Diagnosis Date   COPD (chronic obstructive pulmonary disease) (HCC)    Diabetes (HCC) 09/17/2021   ED (erectile dysfunction)    Gout    Hypertension    Pneumonia    Prediabetes 09/18/2020    Past Surgical History:  Procedure Laterality Date   KNEE ARTHROSCOPY Right 03/15/2015   WISDOM TOOTH EXTRACTION      Family History  Problem Relation Age of Onset   Hypertension Mother    Prostate cancer Father    Hypotension Father    Colon cancer Paternal Uncle    Diabetes Maternal Grandmother    Lung cancer Maternal Grandfather    Pancreatic cancer Paternal Grandmother    Colon polyps Cousin    Cancer Other        all paternal uncles and anunts passed with cancer    Social History   Socioeconomic History   Marital status: Divorced    Spouse name: Not on file   Number of children: 1   Years of education: Not on file   Highest education level: 12th  grade  Occupational History   Occupation: servic tech/ [employer]   Occupation: Sports coach  Tobacco Use   Smoking status: Some Days    Current packs/day: 0.25    Types: Cigarettes   Smokeless tobacco: Former    Types: Engineer, drilling   Vaping status: Never Used  Substance and Sexual Activity   Alcohol use: Yes    Comment: occas   Drug use: No   Sexual activity: Yes  Other Topics Concern   Not on file  Social History Narrative   Not on file   Social Determinants of Health   Financial Resource Strain: Low Risk  (03/16/2023)   Overall Financial Resource Strain (CARDIA)    Difficulty of Paying Living Expenses: Not hard at all  Food Insecurity: No Food Insecurity (03/16/2023)   Hunger Vital Sign    Worried About Running Out of Food in the Last Year: Never true    Ran Out of Food in the Last Year: Never true  Transportation Needs: No Transportation Needs (03/16/2023)   PRAPARE - Administrator, Civil Service (Medical): No    Lack of Transportation (Non-Medical): No  Physical Activity: Insufficiently Active (03/16/2023)   Exercise Vital Sign    Days of Exercise per  Week: 3 days    Minutes of Exercise per Session: 30 min  Stress: No Stress Concern Present (03/16/2023)   [employer] of Occupational Health - Occupational Stress Questionnaire    Feeling of Stress : Not at all  Social Connections: Moderately Integrated (03/16/2023)   Social Connection and Isolation Panel [NHANES]    Frequency of Communication with Friends and Family: More than three times a week    Frequency of Social Gatherings with Friends and Family: More than three times a week    Attends Religious Services: 1 to 4 times per year    Active Member of [employer] or Organizations: Yes    Attends Engineer, structural: More than 4 times per year    Marital Status: Divorced  Catering manager Violence: Not on file    Outpatient Medications Prior to Visit  Medication Sig Dispense Refill    albuterol (VENTOLIN HFA) 108 (90 Base) MCG/ACT inhaler Inhale 2 puffs into the lungs every 6 (six) hours as needed for wheezing or shortness of breath. 8 g 0   allopurinol (ZYLOPRIM) 100 MG tablet Take 2 tablets (200 mg total) by mouth daily. 180 tablet 1   amLODipine (NORVASC) 10 MG tablet Take 1 tablet (10 mg total) by mouth daily. 90 tablet 1   [employer]S BUDESONIDE 32 MCG/ACT nasal spray Place into both nostrils.     ipratropium (ATROVENT) 0.03 % nasal spray PLEASE SEE ATTACHED FOR DETAILED DIRECTIONS     lidocaine (XYLOCAINE) 2 % solution Use as directed 15 mLs in the mouth or throat as needed. 100 mL 0   lisinopril (ZESTRIL) 40 MG tablet Take 1 tablet (40 mg total) by mouth daily. 90 tablet 1   metFORMIN (GLUCOPHAGE-XR) 500 MG 24 hr tablet Take 1 tablet (500 mg total) by mouth 2 (two) times daily with a meal. 90 tablet 0   metFORMIN (GLUCOPHAGE-XR) 500 MG 24 hr tablet Take 1 tablet (500 mg total) by mouth 2 (two) times daily with a meal. 30 tablet 0   pantoprazole (PROTONIX) 40 MG tablet Take 1 tablet (40 mg total) by mouth daily before breakfast. 90 tablet 1   rosuvastatin (CRESTOR) 5 MG tablet TAKE 1 TABLET DAILY WITH SUPPER 90 tablet 0   sildenafil (VIAGRA) 100 MG tablet Take 1 tablet (100 mg total) by mouth daily as needed for erectile dysfunction. 30 tablet 1   Tiotropium Bromide Monohydrate (SPIRIVA RESPIMAT) 2.5 MCG/ACT AERS Inhale 2 puffs into the lungs daily. 12 g 1   Vitamin D, Ergocalciferol, (DRISDOL) 1.25 MG (50000 UNIT) CAPS capsule Take 1 capsule (50,000 Units total) by mouth every 7 (seven) days. 20 capsule 1   promethazine-dextromethorphan (PROMETHAZINE-DM) 6.25-15 MG/5ML syrup Take 5 mLs by mouth 4 (four) times daily as needed for cough. 118 mL 0   benzonatate (TESSALON) 200 MG capsule Take 1 capsule (200 mg total) by mouth 2 (two) times daily as needed for cough. (Patient not taking: Reported on 04/27/2023) 20 capsule 0   azithromycin (ZITHROMAX) 250 MG tablet Take 2 tablets on  day 1, then 1 tablet daily on days 2 through 5 (Patient not taking: Reported on 04/27/2023) 6 tablet 0   No facility-administered medications prior to visit.    Allergies  Allergen Reactions   Penicillins Rash    Review of Systems  Constitutional:  Negative for chills and fever.  HENT:  Positive for sore throat. Negative for congestion.   Eyes:  Negative for pain and discharge.  Respiratory:  Positive for cough and shortness  of breath.   Cardiovascular:  Negative for chest pain and palpitations.  Gastrointestinal:  Negative for diarrhea, nausea and vomiting.  Endocrine: Negative for polydipsia and polyuria.  Genitourinary:  Negative for dysuria and hematuria.  Musculoskeletal:  Negative for neck pain and neck stiffness.  Skin:  Negative for rash.  Neurological:  Negative for dizziness and weakness.  Psychiatric/Behavioral:  Negative for agitation and behavioral problems.        Objective:    Physical Exam Vitals reviewed.  Constitutional:      General: He is not in acute distress.    Appearance: He is not diaphoretic.  HENT:     Head: Normocephalic and atraumatic.     Nose: Nose normal.     Mouth/Throat:     Mouth: Mucous membranes are moist.     Pharynx: Posterior oropharyngeal erythema present.  Eyes:     General: No scleral icterus.    Extraocular Movements: Extraocular movements intact.  Cardiovascular:     Rate and Rhythm: Normal rate and regular rhythm.     Heart sounds: Normal heart sounds. No murmur heard. Pulmonary:     Breath sounds: Wheezing (Diffuse bilaterally) present. No rales.  Musculoskeletal:     Cervical back: Neck supple. No tenderness.     Right lower leg: No edema.     Left lower leg: No edema.  Skin:    General: Skin is warm.     Findings: No rash.  Neurological:     General: No focal deficit present.     Mental Status: He is alert and oriented to person, place, and time.  Psychiatric:        Mood and Affect: Mood normal.         Behavior: Behavior normal.     BP 120/71   Pulse 81   Temp 98.3 F (36.8 C) (Oral)   Resp 16   Ht 6\' 3"  (1.905 m)   Wt 257 lb 12.8 oz (116.9 kg)   SpO2 90%   BMI 32.22 kg/m  Wt Readings from Last 3 Encounters:  04/27/23 257 lb 12.8 oz (116.9 kg)  04/17/23 261 lb (118.4 kg)  03/20/23 262 lb 0.6 oz (118.9 kg)        Assessment & Plan:   Problem List Items Addressed This Visit       Respiratory   COPD with acute exacerbation (HCC) - Primary    Recently had oral prednisone 40 mg x 5 days and Z-Pak Has persistent symptoms Depo-Medrol 80 mg IM today Sterapred taper and levofloxacin prescribed for COPD exacerbation Needs to use Stiolto regularly and albuterol as needed for dyspnea or wheezing Cheratussin as needed for cough      Relevant Medications   predniSONE (STERAPRED UNI-PAK 21 TAB) 10 MG (21) TBPK tablet   guaiFENesin-codeine (ROBITUSSIN AC) 100-10 MG/5ML syrup   levofloxacin (LEVAQUIN) 500 MG tablet     Endocrine   Type 2 diabetes mellitus with hyperglycemia, without long-term current use of insulin (HCC)    Currently on metformin 500 mg every morning and 1000 mg every afternoon Would be cautious about oral steroids, but considering his persistent symptoms of COPD exacerbation, have to provide oral prednisone He reports that his blood glucose was less than 200 while taking steroids recently Advised to contact if his blood glucose remains above 200 while taking Sterapred        Meds ordered this encounter  Medications   predniSONE (STERAPRED UNI-PAK 21 TAB) 10 MG (21) TBPK tablet  Sig: Take as package instructions    Dispense:  1 each    Refill:  0   guaiFENesin-codeine (ROBITUSSIN AC) 100-10 MG/5ML syrup    Sig: Take 5 mLs by mouth 3 (three) times daily as needed for cough.    Dispense:  120 mL    Refill:  0   levofloxacin (LEVAQUIN) 500 MG tablet    Sig: Take 1 tablet (500 mg total) by mouth daily for 7 days.    Dispense:  7 tablet    Refill:  0    methylPREDNISolone acetate (DEPO-MEDROL) injection 80 mg     Anabel Halon, MD

## 2023-04-27 NOTE — Patient Instructions (Addendum)
Please start taking Levofloxacin and Prednisone as prescribed.  Please start taking Cheratussin as needed for cough.  Please continue using Stiolto regularly and use Albuterol as needed for shortness of breath.

## 2023-04-27 NOTE — Assessment & Plan Note (Signed)
Recently had oral prednisone 40 mg x 5 days and Z-Pak Has persistent symptoms Depo-Medrol 80 mg IM today Sterapred taper and levofloxacin prescribed for COPD exacerbation Needs to use Stiolto regularly and albuterol as needed for dyspnea or wheezing Cheratussin as needed for cough

## 2023-04-27 NOTE — Assessment & Plan Note (Addendum)
Needs to cut down -> quit smoking

## 2023-04-27 NOTE — Assessment & Plan Note (Signed)
Currently on metformin 500 mg every morning and 1000 mg every afternoon Would be cautious about oral steroids, but considering his persistent symptoms of COPD exacerbation, have to provide oral prednisone He reports that his blood glucose was less than 200 while taking steroids recently Advised to contact if his blood glucose remains above 200 while taking Sterapred

## 2023-05-01 ENCOUNTER — Telehealth: Payer: Self-pay | Admitting: Family Medicine

## 2023-05-01 DIAGNOSIS — R7989 Other specified abnormal findings of blood chemistry: Secondary | ICD-10-CM | POA: Diagnosis not present

## 2023-05-01 NOTE — Telephone Encounter (Signed)
Patient called in requesting call back   Had work physical and labs came back with white blood cell count over 23000  Wants a cll back in regard

## 2023-05-03 ENCOUNTER — Other Ambulatory Visit: Payer: Self-pay | Admitting: Family Medicine

## 2023-05-03 DIAGNOSIS — E1165 Type 2 diabetes mellitus with hyperglycemia: Secondary | ICD-10-CM

## 2023-05-05 ENCOUNTER — Ambulatory Visit: Payer: BC Managed Care – PPO | Admitting: Family Medicine

## 2023-05-05 ENCOUNTER — Encounter: Payer: Self-pay | Admitting: Family Medicine

## 2023-05-05 VITALS — BP 119/85 | HR 78 | Ht 75.0 in | Wt 249.0 lb

## 2023-05-05 DIAGNOSIS — D72820 Lymphocytosis (symptomatic): Secondary | ICD-10-CM | POA: Diagnosis not present

## 2023-05-05 DIAGNOSIS — D72829 Elevated white blood cell count, unspecified: Secondary | ICD-10-CM | POA: Diagnosis not present

## 2023-05-05 NOTE — Progress Notes (Addendum)
Established Patient Office Visit  Subjective:  Patient ID: Chase Christensen, male    DOB: [birth year]  Age: 52 y.o. MRN: [id number]  CC:  Chief Complaint  Patient presents with   Results    Pt f/u on high white blood cell count. States he went [facility] and had a blood test showing his white blood cell count is very elevated.     HPI Chase Christensen is a 52 y.o. male with past medical history of GERD, erectile dysfunction associated with type 2 diabetes and tobacco use presents for f/u of  chronic medical conditions  Leukocytosis: The patient, who was recently treated for a viral upper respiratory infection and COPD exacerbation, presents with lymphocytosis following recent treatments. On 04/17/2023, the patient was prescribed azithromycin, prednisone (for 5 days), and Tessalon Perles for the respiratory infection. On 04/27/2023, treatment for a COPD exacerbation included an 80 mg IM injection of Depo-Medrol, followed by a steroid taper and levofloxacin. The patient completed the oral steroid regimen two days ago and reports having one day left of the antibiotic course.  Currently, the patient reports improvement in symptoms but acknowledges that recent blood work indicated an elevated white blood cell count, prompting a recommendation to follow up with his primary care provider. He denies having fever, chills, or unintentional weight loss, but does report experiencing a loss of appetite and reduced food intake.   Past Medical History:  Diagnosis Date   COPD (chronic obstructive pulmonary disease) (HCC)    Diabetes (HCC) 09/17/2021   ED (erectile dysfunction)    Gout    Hypertension    Pneumonia    Prediabetes 09/18/2020    Past Surgical History:  Procedure Laterality Date   KNEE ARTHROSCOPY Right 03/15/2015   WISDOM TOOTH EXTRACTION      Family History  Problem Relation Age of Onset   Hypertension Mother    Prostate cancer Father    Hypotension Father    Colon  cancer Paternal Uncle    Diabetes Maternal Grandmother    Lung cancer Maternal Grandfather    Pancreatic cancer Paternal Grandmother    Colon polyps Cousin    Cancer Other        all paternal uncles and anunts passed with cancer    Social History   Socioeconomic History   Marital status: Divorced    Spouse name: Not on file   Number of children: 1   Years of education: Not on file   Highest education level: 12th grade  Occupational History   Occupation: servic tech/ [employer]   Occupation: Sports coach  Tobacco Use   Smoking status: Some Days    Current packs/day: 0.25    Types: Cigarettes   Smokeless tobacco: Former    Types: Engineer, drilling   Vaping status: Never Used  Substance and Sexual Activity   Alcohol use: Yes    Comment: occas   Drug use: No   Sexual activity: Yes  Other Topics Concern   Not on file  Social History Narrative   Not on file   Social Determinants of Health   Financial Resource Strain: Low Risk  (03/16/2023)   Overall Financial Resource Strain (CARDIA)    Difficulty of Paying Living Expenses: Not hard at all  Food Insecurity: No Food Insecurity (03/16/2023)   Hunger Vital Sign    Worried About Running Out of Food in the Last Year: Never true    Ran Out of Food in the Last Year:  Never true  Transportation Needs: No Transportation Needs (03/16/2023)   PRAPARE - Administrator, Civil Service (Medical): No    Lack of Transportation (Non-Medical): No  Physical Activity: Insufficiently Active (03/16/2023)   Exercise Vital Sign    Days of Exercise per Week: 3 days    Minutes of Exercise per Session: 30 min  Stress: No Stress Concern Present (03/16/2023)   [employer] of Occupational Health - Occupational Stress Questionnaire    Feeling of Stress : Not at all  Social Connections: Moderately Integrated (03/16/2023)   Social Connection and Isolation Panel [NHANES]    Frequency of Communication with Friends and Family: More  than three times a week    Frequency of Social Gatherings with Friends and Family: More than three times a week    Attends Religious Services: 1 to 4 times per year    Active Member of [employer] or Organizations: Yes    Attends Engineer, structural: More than 4 times per year    Marital Status: Divorced  Catering manager Violence: Not on file    Outpatient Medications Prior to Visit  Medication Sig Dispense Refill   albuterol (VENTOLIN HFA) 108 (90 Base) MCG/ACT inhaler Inhale 2 puffs into the lungs every 6 (six) hours as needed for wheezing or shortness of breath. 8 g 0   allopurinol (ZYLOPRIM) 100 MG tablet Take 2 tablets (200 mg total) by mouth daily. 180 tablet 1   amLODipine (NORVASC) 10 MG tablet Take 1 tablet (10 mg total) by mouth daily. 90 tablet 1   benzonatate (TESSALON) 200 MG capsule Take 1 capsule (200 mg total) by mouth 2 (two) times daily as needed for cough. (Patient not taking: Reported on 05/19/2023) 20 capsule 0   [employer] BUDESONIDE 32 MCG/ACT nasal spray Place into both nostrils.     guaiFENesin-codeine (ROBITUSSIN AC) 100-10 MG/5ML syrup Take 5 mLs by mouth 3 (three) times daily as needed for cough. (Patient not taking: Reported on 05/19/2023) 120 mL 0   ipratropium (ATROVENT) 0.03 % nasal spray PLEASE SEE ATTACHED FOR DETAILED DIRECTIONS     lidocaine (XYLOCAINE) 2 % solution Use as directed 15 mLs in the mouth or throat as needed. 100 mL 0   lisinopril (ZESTRIL) 40 MG tablet Take 1 tablet (40 mg total) by mouth daily. 90 tablet 1   metFORMIN (GLUCOPHAGE-XR) 500 MG 24 hr tablet Take 1 tablet (500 mg total) by mouth 2 (two) times daily with a meal. 90 tablet 0   metFORMIN (GLUCOPHAGE-XR) 500 MG 24 hr tablet TAKE 1 TABLET BY MOUTH 2 TIMES DAILY WITH A MEAL. 180 tablet 1   pantoprazole (PROTONIX) 40 MG tablet Take 1 tablet (40 mg total) by mouth daily before breakfast. 90 tablet 1   rosuvastatin (CRESTOR) 5 MG tablet TAKE 1 TABLET DAILY WITH SUPPER 90 tablet 0    sildenafil (VIAGRA) 100 MG tablet Take 1 tablet (100 mg total) by mouth daily as needed for erectile dysfunction. 30 tablet 1   Tiotropium Bromide Monohydrate (SPIRIVA RESPIMAT) 2.5 MCG/ACT AERS Inhale 2 puffs into the lungs daily. 12 g 1   Vitamin D, Ergocalciferol, (DRISDOL) 1.25 MG (50000 UNIT) CAPS capsule Take 1 capsule (50,000 Units total) by mouth every 7 (seven) days. 20 capsule 1   predniSONE (STERAPRED UNI-PAK 21 TAB) 10 MG (21) TBPK tablet Take as package instructions 1 each 0   No facility-administered medications prior to visit.    Allergies  Allergen Reactions   Penicillins Rash  ROS Review of Systems  Constitutional:  Negative for fatigue and fever.  Eyes:  Negative for visual disturbance.  Respiratory:  Negative for chest tightness and shortness of breath.   Cardiovascular:  Negative for chest pain and palpitations.  Neurological:  Negative for dizziness and headaches.      Objective:    Physical Exam HENT:     Head: Normocephalic.     Right Ear: External ear normal.     Left Ear: External ear normal.     Nose: No congestion or rhinorrhea.     Mouth/Throat:     Mouth: Mucous membranes are moist.  Cardiovascular:     Rate and Rhythm: Regular rhythm.     Heart sounds: No murmur heard. Pulmonary:     Effort: No respiratory distress.     Breath sounds: Normal breath sounds.  Neurological:     Mental Status: He is alert.     BP 119/85   Pulse 78   Ht 6\' 3"  (1.905 m)   Wt 249 lb (112.9 kg)   SpO2 91%   BMI 31.12 kg/m  Wt Readings from Last 3 Encounters:  05/19/23 250 lb 1.9 oz (113.5 kg)  05/05/23 249 lb (112.9 kg)  04/27/23 257 lb 12.8 oz (116.9 kg)    Lab Results  Component Value Date   TSH 2.670 03/23/2023   Lab Results  Component Value Date   WBC 10.3 05/05/2023   HGB 17.6 05/05/2023   HCT 51.7 (H) 05/05/2023   MCV 95 05/05/2023   PLT 250 05/05/2023   Lab Results  Component Value Date   NA 138 05/05/2023   K 4.7 05/05/2023   CO2  24 05/05/2023   GLUCOSE 202 (H) 05/05/2023   BUN 18 05/05/2023   CREATININE 0.98 05/05/2023   BILITOT 0.3 03/23/2023   ALKPHOS 80 03/23/2023   AST 27 03/23/2023   ALT 33 03/23/2023   PROT 6.6 03/23/2023   ALBUMIN 4.2 03/23/2023   CALCIUM 9.7 05/05/2023   ANIONGAP 9 12/23/2021   EGFR 93 05/05/2023   Lab Results  Component Value Date   CHOL 102 03/23/2023   Lab Results  Component Value Date   HDL 37 (L) 03/23/2023   Lab Results  Component Value Date   LDLCALC 34 03/23/2023   Lab Results  Component Value Date   TRIG 194 (H) 03/23/2023   Lab Results  Component Value Date   CHOLHDL 2.8 03/23/2023   Lab Results  Component Value Date   HGBA1C 7.5 (H) 03/23/2023      Assessment & Plan:  Leukocytosis, unspecified type Assessment & Plan: The recent leukocytosis is likely attributable to the patient's recent administration of steroids. A repeat CBC will be conducted today to monitor his current blood count. The patient has been advised to maintain a high-protein, high-calorie diet to support recovery and overall health. He has also been instructed to follow up  if he experiences any concerning symptoms such as fever, night sweats, fatigue, unexplained weight loss, or frequent infections.    Orders: -     CBC with Differential/Platelet -     BMP8+EGFR  Note: This chart has been completed using Engineer, civil (consulting) software, and while attempts have been made to ensure accuracy, certain words and phrases may not be transcribed as intended.    Follow-up: No follow-ups on file.   Gilmore Laroche, FNP

## 2023-05-05 NOTE — Patient Instructions (Addendum)
I appreciate the opportunity to provide care to you today!    Follow up:   months  Labs: please stop by the lab today to get your blood drawn (CBC, BMP)  To improve your appetite, I recommend:  Encourage high-calorie and high-protein diet Recommended eating  5 to 6 small meals a day, instead of 3 large meals Encouraged to keep snacks around that are easy to eat. Try to eat often, even if you don't feel hungry Encouraged to drink high-protein nutritional supplement drinks (sample brand names: Ensure, Boost, Carnation Instant Breakfast   What foods give me extra calories or protein?  Eat these foods or mix them with other foods to increase calories and protein.  ?Grains - Add butter, cream cheese, or nut butter to bread, crackers, or pancakes. Mix pasta or quinoa with meats or vegetables. Sprinkle granola on yogurts and hot cereals. Choose croissants, muffins, and biscuits.  ?Fruits - Mix fresh, frozen, or dried fruit into cereal or smoothies. Drink 100 percent fruit juice. Eat fresh, frozen, or canned fruit packed in its own juice.  ?Vegetables - Add cheese, butter, or sauces to vegetables. Put avocado in sandwiches. Eat bean dip, guacamole, or hummus with chips.  ?Dairy - Use full-fat milk or milk products. Add cheese to sandwiches and casseroles. Add [place] yogurt, heavy cream, or whipping cream to smoothies and shakes. Put sour cream or butter in other foods. Eat ice cream, custard, pudding, and cottage cheese.  ?Meats, poultry, seafood, and proteins - Add meats or eggs to salads, casseroles, and vegetables. Use gravies and sauces. Snack on nuts and nut butter.  ?Other foods or drinks - Drink high-protein nutritional supplement drinks (sample brand names: Ensure, Boost, [employer]).  Some tips to help you increase calories and protein in your diet:  ?Eat 5 to 6 small meals a day, instead of 3 large meals. You can also choose high-calorie snacks and drinks between  meals.  ?Keep snacks around that are easy to eat. Try to eat often, even if you don't feel hungry.  ?Add butter, olive oil, pesto, nuts, sauces, gravy, powdered milk, protein powder, or cream to your foods. This gives them extra calories and protein.  ?Drink 100 percent fruit juice, milkshakes, and smoothies instead of water. Try to drink at the end of meals so you don't fill up too soon.  ?Add syrup, jams and jellies, honey, or brown sugar to other foods. Snack on protein bars.   Attached with your AVS, you will find valuable resources for self-education. I highly recommend dedicating some time to thoroughly examine them.   Please continue to a heart-healthy diet and increase your physical activities. Try to exercise for at least five days a week.    It was a pleasure to see you and I look forward to continuing to work together on your health and well-being. Please do not hesitate to call the office if you need care or have questions about your care.  In case of emergency, please visit the Emergency Department for urgent care, or contact our clinic at [phone] to schedule an appointment. We're here to help you!   Have a wonderful day and week. With Gratitude, Gilmore Laroche MSN, FNP-BC

## 2023-05-06 LAB — CBC WITH DIFFERENTIAL/PLATELET
Basophils Absolute: 0.1 10*3/uL (ref 0.0–0.2)
Basos: 1 %
EOS (ABSOLUTE): 0.3 10*3/uL (ref 0.0–0.4)
Eos: 3 %
Hematocrit: 51.7 % — ABNORMAL HIGH (ref 37.5–51.0)
Hemoglobin: 17.6 g/dL (ref 13.0–17.7)
Immature Grans (Abs): 0.2 10*3/uL — ABNORMAL HIGH (ref 0.0–0.1)
Immature Granulocytes: 2 %
Lymphocytes Absolute: 2.4 10*3/uL (ref 0.7–3.1)
Lymphs: 23 %
MCH: 32.3 pg (ref 26.6–33.0)
MCHC: 34 g/dL (ref 31.5–35.7)
MCV: 95 fL (ref 79–97)
Monocytes Absolute: 0.7 10*3/uL (ref 0.1–0.9)
Monocytes: 7 %
Neutrophils Absolute: 6.7 10*3/uL (ref 1.4–7.0)
Neutrophils: 64 %
Platelets: 250 10*3/uL (ref 150–450)
RBC: 5.45 x10E6/uL (ref 4.14–5.80)
RDW: 11.9 % (ref 11.6–15.4)
WBC: 10.3 10*3/uL (ref 3.4–10.8)

## 2023-05-06 LAB — BMP8+EGFR
BUN/Creatinine Ratio: 18 (ref 9–20)
BUN: 18 mg/dL (ref 6–24)
CO2: 24 mmol/L (ref 20–29)
Calcium: 9.7 mg/dL (ref 8.7–10.2)
Chloride: 100 mmol/L (ref 96–106)
Creatinine, Ser: 0.98 mg/dL (ref 0.76–1.27)
Glucose: 202 mg/dL — ABNORMAL HIGH (ref 70–99)
Potassium: 4.7 mmol/L (ref 3.5–5.2)
Sodium: 138 mmol/L (ref 134–144)
eGFR: 93 mL/min/{1.73_m2} (ref >59)

## 2023-05-06 LAB — TESTOSTERONE, FREE, TOTAL, SHBG
Sex Hormone Binding: 18.3 nmol/L — ABNORMAL LOW (ref 19.3–76.4)
Testosterone, Free: 2.8 pg/mL — ABNORMAL LOW (ref 7.2–24.0)
Testosterone: 180 ng/dL — ABNORMAL LOW (ref 264–916)

## 2023-05-07 DIAGNOSIS — D72829 Elevated white blood cell count, unspecified: Secondary | ICD-10-CM | POA: Insufficient documentation

## 2023-05-07 DIAGNOSIS — D72820 Lymphocytosis (symptomatic): Secondary | ICD-10-CM | POA: Insufficient documentation

## 2023-05-07 NOTE — Assessment & Plan Note (Addendum)
The recent leukocytosis is likely attributable to the patient's recent administration of steroids. A repeat CBC will be conducted today to monitor his current blood count. The patient has been advised to maintain a high-protein, high-calorie diet to support recovery and overall health. He has also been instructed to follow up  if he experiences any concerning symptoms such as fever, night sweats, fatigue, unexplained weight loss, or frequent infections.

## 2023-05-19 ENCOUNTER — Encounter: Payer: Self-pay | Admitting: Family Medicine

## 2023-05-19 ENCOUNTER — Ambulatory Visit: Payer: BC Managed Care – PPO | Admitting: Family Medicine

## 2023-05-19 VITALS — BP 134/86 | HR 70 | Resp 16 | Ht 75.0 in | Wt 250.1 lb

## 2023-05-19 DIAGNOSIS — D72829 Elevated white blood cell count, unspecified: Secondary | ICD-10-CM | POA: Diagnosis not present

## 2023-05-19 NOTE — Assessment & Plan Note (Signed)
No fever, chills, other unintentional weight loss reported The patient reports feeling well

## 2023-05-19 NOTE — Patient Instructions (Addendum)
I appreciate the opportunity to provide care to you today!    Follow up:  07/19/2022  You have one refill available for your metformin until your next appointment with me.    Please continue to a heart-healthy diet and increase your physical activities. Try to exercise for at least five days a week.    It was a pleasure to see you and I look forward to continuing to work together on your health and well-being. Please do not hesitate to call the office if you need care or have questions about your care.  In case of emergency, please visit the Emergency Department for urgent care, or contact our clinic at [phone] to schedule an appointment. We're here to help you!   Have a wonderful day and week. With Gratitude, Gilmore Laroche MSN, FNP-BC

## 2023-05-19 NOTE — Progress Notes (Signed)
Established Patient Office Visit  Subjective:  Patient ID: Chase Christensen, male    DOB: Apr 29, 1971  Age: 52 y.o. MRN: [id number]  CC:  Chief Complaint  Patient presents with   Follow-up    2 week follow up     HPI Chase Christensen is a 52 y.o. male with past medical history of leukocytosis presents for f/u. For the details of today's visit, please refer to the assessment and plan.     Past Medical History:  Diagnosis Date   COPD (chronic obstructive pulmonary disease) (HCC)    Diabetes (HCC) 09/17/2021   ED (erectile dysfunction)    Gout    Hypertension    Pneumonia    Prediabetes 09/18/2020    Past Surgical History:  Procedure Laterality Date   KNEE ARTHROSCOPY Right 03/15/2015   WISDOM TOOTH EXTRACTION      Family History  Problem Relation Age of Onset   Hypertension Mother    Prostate cancer Father    Hypotension Father    Colon cancer Paternal Uncle    Diabetes Maternal Grandmother    Lung cancer Maternal Grandfather    Pancreatic cancer Paternal Grandmother    Colon polyps Cousin    Cancer Other        all paternal uncles and anunts passed with cancer    Social History   Socioeconomic History   Marital status: Divorced    Spouse name: Not on file   Number of children: 1   Years of education: Not on file   Highest education level: 12th grade  Occupational History   Occupation: servic tech/ [employer]   Occupation: Sports coach  Tobacco Use   Smoking status: Some Days    Current packs/day: 0.25    Types: Cigarettes   Smokeless tobacco: Former    Types: Engineer, drilling   Vaping status: Never Used  Substance and Sexual Activity   Alcohol use: Yes    Comment: occas   Drug use: No   Sexual activity: Yes  Other Topics Concern   Not on file  Social History Narrative   Not on file   Social Determinants of Health   Financial Resource Strain: Low Risk  (03/16/2023)   Overall Financial Resource Strain (CARDIA)    Difficulty of Paying  Living Expenses: Not hard at all  Food Insecurity: No Food Insecurity (03/16/2023)   Hunger Vital Sign    Worried About Running Out of Food in the Last Year: Never true    Ran Out of Food in the Last Year: Never true  Transportation Needs: No Transportation Needs (03/16/2023)   PRAPARE - Administrator, Civil Service (Medical): No    Lack of Transportation (Non-Medical): No  Physical Activity: Insufficiently Active (03/16/2023)   Exercise Vital Sign    Days of Exercise per Week: 3 days    Minutes of Exercise per Session: 30 min  Stress: No Stress Concern Present (03/16/2023)   [employer] of Occupational Health - Occupational Stress Questionnaire    Feeling of Stress : Not at all  Social Connections: Moderately Integrated (03/16/2023)   Social Connection and Isolation Panel [NHANES]    Frequency of Communication with Friends and Family: More than three times a week    Frequency of Social Gatherings with Friends and Family: More than three times a week    Attends Religious Services: 1 to 4 times per year    Active Member of [employer] or Organizations: Yes  Attends Engineer, structural: More than 4 times per year    Marital Status: Divorced  Catering manager Violence: Not on file    Outpatient Medications Prior to Visit  Medication Sig Dispense Refill   albuterol (VENTOLIN HFA) 108 (90 Base) MCG/ACT inhaler Inhale 2 puffs into the lungs every 6 (six) hours as needed for wheezing or shortness of breath. 8 g 0   allopurinol (ZYLOPRIM) 100 MG tablet Take 2 tablets (200 mg total) by mouth daily. 180 tablet 1   amLODipine (NORVASC) 10 MG tablet Take 1 tablet (10 mg total) by mouth daily. 90 tablet 1   [employer] BUDESONIDE 32 MCG/ACT nasal spray Place into both nostrils.     ipratropium (ATROVENT) 0.03 % nasal spray PLEASE SEE ATTACHED FOR DETAILED DIRECTIONS     lidocaine (XYLOCAINE) 2 % solution Use as directed 15 mLs in the mouth or throat as needed. 100 mL 0   lisinopril  (ZESTRIL) 40 MG tablet Take 1 tablet (40 mg total) by mouth daily. 90 tablet 1   metFORMIN (GLUCOPHAGE-XR) 500 MG 24 hr tablet Take 1 tablet (500 mg total) by mouth 2 (two) times daily with a meal. 90 tablet 0   metFORMIN (GLUCOPHAGE-XR) 500 MG 24 hr tablet TAKE 1 TABLET BY MOUTH 2 TIMES DAILY WITH A MEAL. 180 tablet 1   pantoprazole (PROTONIX) 40 MG tablet Take 1 tablet (40 mg total) by mouth daily before breakfast. 90 tablet 1   rosuvastatin (CRESTOR) 5 MG tablet TAKE 1 TABLET DAILY WITH SUPPER 90 tablet 0   sildenafil (VIAGRA) 100 MG tablet Take 1 tablet (100 mg total) by mouth daily as needed for erectile dysfunction. 30 tablet 1   Tiotropium Bromide Monohydrate (SPIRIVA RESPIMAT) 2.5 MCG/ACT AERS Inhale 2 puffs into the lungs daily. 12 g 1   Vitamin D, Ergocalciferol, (DRISDOL) 1.25 MG (50000 UNIT) CAPS capsule Take 1 capsule (50,000 Units total) by mouth every 7 (seven) days. 20 capsule 1   benzonatate (TESSALON) 200 MG capsule Take 1 capsule (200 mg total) by mouth 2 (two) times daily as needed for cough. (Patient not taking: Reported on 05/19/2023) 20 capsule 0   guaiFENesin-codeine (ROBITUSSIN AC) 100-10 MG/5ML syrup Take 5 mLs by mouth 3 (three) times daily as needed for cough. (Patient not taking: Reported on 05/19/2023) 120 mL 0   No facility-administered medications prior to visit.    Allergies  Allergen Reactions   Penicillins Rash    ROS Review of Systems  Constitutional:  Negative for fatigue and fever.  Eyes:  Negative for visual disturbance.  Respiratory:  Negative for chest tightness and shortness of breath.   Cardiovascular:  Negative for chest pain and palpitations.  Neurological:  Negative for dizziness and headaches.      Objective:    Physical Exam HENT:     Head: Normocephalic.     Right Ear: External ear normal.     Left Ear: External ear normal.     Nose: No congestion or rhinorrhea.     Mouth/Throat:     Mouth: Mucous membranes are moist.   Cardiovascular:     Rate and Rhythm: Regular rhythm.     Heart sounds: No murmur heard. Pulmonary:     Effort: No respiratory distress.     Breath sounds: Normal breath sounds.  Neurological:     Mental Status: He is alert.     BP (!) 136/91   Pulse 70   Resp 16   Ht 6\' 3"  (1.905 m)  Wt 250 lb 1.9 oz (113.5 kg)   SpO2 92%   BMI 31.26 kg/m  Wt Readings from Last 3 Encounters:  05/19/23 250 lb 1.9 oz (113.5 kg)  05/05/23 249 lb (112.9 kg)  04/27/23 257 lb 12.8 oz (116.9 kg)    Lab Results  Component Value Date   TSH 2.670 03/23/2023   Lab Results  Component Value Date   WBC 10.3 05/05/2023   HGB 17.6 05/05/2023   HCT 51.7 (H) 05/05/2023   MCV 95 05/05/2023   PLT 250 05/05/2023   Lab Results  Component Value Date   NA 138 05/05/2023   K 4.7 05/05/2023   CO2 24 05/05/2023   GLUCOSE 202 (H) 05/05/2023   BUN 18 05/05/2023   CREATININE 0.98 05/05/2023   BILITOT 0.3 03/23/2023   ALKPHOS 80 03/23/2023   AST 27 03/23/2023   ALT 33 03/23/2023   PROT 6.6 03/23/2023   ALBUMIN 4.2 03/23/2023   CALCIUM 9.7 05/05/2023   ANIONGAP 9 12/23/2021   EGFR 93 05/05/2023   Lab Results  Component Value Date   CHOL 102 03/23/2023   Lab Results  Component Value Date   HDL 37 (L) 03/23/2023   Lab Results  Component Value Date   LDLCALC 34 03/23/2023   Lab Results  Component Value Date   TRIG 194 (H) 03/23/2023   Lab Results  Component Value Date   CHOLHDL 2.8 03/23/2023   Lab Results  Component Value Date   HGBA1C 7.5 (H) 03/23/2023      Assessment & Plan:  Leukocytosis, unspecified type Assessment & Plan: No fever, chills, other unintentional weight loss reported The patient reports feeling well   Note: This chart has been completed using [employer] software, and while attempts have been made to ensure accuracy, certain words and phrases may not be transcribed as intended.    Follow-up: No follow-ups on file.   Gilmore Laroche, FNP

## 2023-06-01 ENCOUNTER — Other Ambulatory Visit: Payer: Self-pay | Admitting: Family Medicine

## 2023-06-01 ENCOUNTER — Encounter: Payer: Self-pay | Admitting: Family Medicine

## 2023-06-01 DIAGNOSIS — U071 COVID-19: Secondary | ICD-10-CM

## 2023-06-01 MED ORDER — NIRMATRELVIR/RITONAVIR (PAXLOVID)TABLET: 3.0000 | ORAL_TABLET | Freq: Two times a day (BID) | ORAL | 0 refills | Status: AC

## 2023-07-02 ENCOUNTER — Other Ambulatory Visit: Payer: Self-pay

## 2023-07-02 DIAGNOSIS — E1165 Type 2 diabetes mellitus with hyperglycemia: Secondary | ICD-10-CM

## 2023-07-02 MED ORDER — METFORMIN HCL ER 500 MG PO TB24: 500.0000 mg | ORAL_TABLET | Freq: Two times a day (BID) | ORAL | 3 refills | Status: DC

## 2023-07-20 ENCOUNTER — Ambulatory Visit (INDEPENDENT_AMBULATORY_CARE_PROVIDER_SITE_OTHER): Payer: 59 | Admitting: Family Medicine

## 2023-07-20 ENCOUNTER — Encounter: Payer: Self-pay | Admitting: Family Medicine

## 2023-07-20 VITALS — BP 122/77 | HR 80 | Ht 75.0 in | Wt 251.1 lb

## 2023-07-20 DIAGNOSIS — E785 Hyperlipidemia, unspecified: Secondary | ICD-10-CM | POA: Diagnosis not present

## 2023-07-20 DIAGNOSIS — E1169 Type 2 diabetes mellitus with other specified complication: Secondary | ICD-10-CM | POA: Diagnosis not present

## 2023-07-20 DIAGNOSIS — Z7984 Long term (current) use of oral hypoglycemic drugs: Secondary | ICD-10-CM | POA: Diagnosis not present

## 2023-07-20 DIAGNOSIS — E1165 Type 2 diabetes mellitus with hyperglycemia: Secondary | ICD-10-CM | POA: Diagnosis not present

## 2023-07-20 DIAGNOSIS — E1159 Type 2 diabetes mellitus with other circulatory complications: Secondary | ICD-10-CM

## 2023-07-20 DIAGNOSIS — E038 Other specified hypothyroidism: Secondary | ICD-10-CM

## 2023-07-20 DIAGNOSIS — R7301 Impaired fasting glucose: Secondary | ICD-10-CM

## 2023-07-20 DIAGNOSIS — I152 Hypertension secondary to endocrine disorders: Secondary | ICD-10-CM | POA: Diagnosis not present

## 2023-07-20 DIAGNOSIS — E559 Vitamin D deficiency, unspecified: Secondary | ICD-10-CM

## 2023-07-20 NOTE — Patient Instructions (Signed)

## 2023-07-20 NOTE — Progress Notes (Signed)
 Established Patient Office Visit  Subjective:  Patient ID: Chase Christensen, male    DOB: 09-12-1970  Age: 53 y.o. MRN: [id number]  CC:  Chief Complaint  Patient presents with   Follow-up    4 mo    HPI Chase Christensen is a 53 y.o. male with past medical history of hypertension, type II diabetes, and hyperlipidemia presents for f/u of  chronic medical conditions. For the details of today's visit, please refer to the assessment and plan.   Past Medical History:  Diagnosis Date   COPD (chronic obstructive pulmonary disease) (HCC)    Diabetes (HCC) 09/17/2021   ED (erectile dysfunction)    Gout    Hypertension    Pneumonia    Prediabetes 09/18/2020    Past Surgical History:  Procedure Laterality Date   KNEE ARTHROSCOPY Right 03/15/2015   WISDOM TOOTH EXTRACTION      Family History  Problem Relation Age of Onset   Hypertension Mother    Prostate cancer Father    Hypotension Father    Colon cancer Paternal Uncle    Diabetes Maternal Grandmother    Lung cancer Maternal Grandfather    Pancreatic cancer Paternal Grandmother    Colon polyps Cousin    Cancer Other        all paternal uncles and anunts passed with cancer    Social History   Socioeconomic History   Marital status: Divorced    Spouse name: Not on file   Number of children: 1   Years of education: Not on file   Highest education level: 12th grade  Occupational History   Occupation: servic tech/ [employer]   Occupation: Sports coach  Tobacco Use   Smoking status: Some Days    Current packs/day: 0.25    Types: Cigarettes   Smokeless tobacco: Former    Types: Engineer, Drilling   Vaping status: Never Used  Substance and Sexual Activity   Alcohol use: Yes    Comment: occas   Drug use: No   Sexual activity: Yes  Other Topics Concern   Not on file  Social History Narrative   Not on file   Social Drivers of Health   Financial Resource Strain: Low Risk  (03/16/2023)   Overall Financial  Resource Strain (CARDIA)    Difficulty of Paying Living Expenses: Not hard at all  Food Insecurity: No Food Insecurity (03/16/2023)   Hunger Vital Sign    Worried About Running Out of Food in the Last Year: Never true    Ran Out of Food in the Last Year: Never true  Transportation Needs: No Transportation Needs (03/16/2023)   PRAPARE - Administrator, Civil Service (Medical): No    Lack of Transportation (Non-Medical): No  Physical Activity: Insufficiently Active (03/16/2023)   Exercise Vital Sign    Days of Exercise per Week: 3 days    Minutes of Exercise per Session: 30 min  Stress: No Stress Concern Present (03/16/2023)   [employer] of Occupational Health - Occupational Stress Questionnaire    Feeling of Stress : Not at all  Social Connections: Moderately Integrated (03/16/2023)   Social Connection and Isolation Panel [NHANES]    Frequency of Communication with Friends and Family: More than three times a week    Frequency of Social Gatherings with Friends and Family: More than three times a week    Attends Religious Services: 1 to 4 times per year    Active Member of  Clubs or Organizations: Yes    Attends Engineer, Structural: More than 4 times per year    Marital Status: Divorced  Catering Manager Violence: Not on file    Outpatient Medications Prior to Visit  Medication Sig Dispense Refill   albuterol  (VENTOLIN  HFA) 108 (90 Base) MCG/ACT inhaler Inhale 2 puffs into the lungs every 6 (six) hours as needed for wheezing or shortness of breath. 8 g 0   allopurinol  (ZYLOPRIM ) 100 MG tablet Take 2 tablets (200 mg total) by mouth daily. 180 tablet 1   amLODipine  (NORVASC ) 10 MG tablet Take 1 tablet (10 mg total) by mouth daily. 90 tablet 1   [facility] BUDESONIDE 32 MCG/ACT nasal spray Place into both nostrils.     ipratropium (ATROVENT ) 0.03 % nasal spray PLEASE SEE ATTACHED FOR DETAILED DIRECTIONS     lidocaine  (XYLOCAINE ) 2 % solution Use as directed 15 mLs in the mouth  or throat as needed. 100 mL 0   lisinopril  (ZESTRIL ) 40 MG tablet Take 1 tablet (40 mg total) by mouth daily. 90 tablet 1   metFORMIN  (GLUCOPHAGE -XR) 500 MG 24 hr tablet Take 1 tablet (500 mg total) by mouth 2 (two) times daily with a meal. 90 tablet 0   metFORMIN  (GLUCOPHAGE -XR) 500 MG 24 hr tablet Take 1 tablet (500 mg total) by mouth 2 (two) times daily with a meal. 180 tablet 3   pantoprazole  (PROTONIX ) 40 MG tablet Take 1 tablet (40 mg total) by mouth daily before breakfast. 90 tablet 1   rosuvastatin  (CRESTOR ) 5 MG tablet TAKE 1 TABLET DAILY WITH SUPPER 90 tablet 0   sildenafil  (VIAGRA ) 100 MG tablet Take 1 tablet (100 mg total) by mouth daily as needed for erectile dysfunction. 30 tablet 1   Tiotropium Bromide Monohydrate  (SPIRIVA  RESPIMAT) 2.5 MCG/ACT AERS Inhale 2 puffs into the lungs daily. 12 g 1   Vitamin D , Ergocalciferol , (DRISDOL ) 1.25 MG (50000 UNIT) CAPS capsule Take 1 capsule (50,000 Units total) by mouth every 7 (seven) days. 20 capsule 1   benzonatate  (TESSALON ) 200 MG capsule Take 1 capsule (200 mg total) by mouth 2 (two) times daily as needed for cough. (Patient not taking: Reported on 05/19/2023) 20 capsule 0   guaiFENesin -codeine  (ROBITUSSIN AC) 100-10 MG/5ML syrup Take 5 mLs by mouth 3 (three) times daily as needed for cough. (Patient not taking: Reported on 05/19/2023) 120 mL 0   No facility-administered medications prior to visit.    Allergies  Allergen Reactions   Penicillins Rash    ROS Review of Systems  Constitutional:  Negative for fatigue and fever.  Eyes:  Negative for visual disturbance.  Respiratory:  Negative for chest tightness and shortness of breath.   Cardiovascular:  Negative for chest pain and palpitations.  Neurological:  Negative for dizziness and headaches.      Objective:    Physical Exam HENT:     Head: Normocephalic.     Right Ear: External ear normal.     Left Ear: External ear normal.     Nose: No congestion or rhinorrhea.      Mouth/Throat:     Mouth: Mucous membranes are moist.  Cardiovascular:     Rate and Rhythm: Regular rhythm.     Heart sounds: No murmur heard. Pulmonary:     Effort: No respiratory distress.     Breath sounds: Normal breath sounds.  Neurological:     Mental Status: He is alert.     BP 122/77   Pulse 80  Ht 6' 3 (1.905 m)   Wt 251 lb 1.9 oz (113.9 kg)   SpO2 91%   BMI 31.39 kg/m  Wt Readings from Last 3 Encounters:  07/20/23 251 lb 1.9 oz (113.9 kg)  05/19/23 250 lb 1.9 oz (113.5 kg)  05/05/23 249 lb (112.9 kg)    Lab Results  Component Value Date   TSH 2.670 03/23/2023   Lab Results  Component Value Date   WBC 10.3 05/05/2023   HGB 17.6 05/05/2023   HCT 51.7 (H) 05/05/2023   MCV 95 05/05/2023   PLT 250 05/05/2023   Lab Results  Component Value Date   NA 138 05/05/2023   K 4.7 05/05/2023   CO2 24 05/05/2023   GLUCOSE 202 (H) 05/05/2023   BUN 18 05/05/2023   CREATININE 0.98 05/05/2023   BILITOT 0.3 03/23/2023   ALKPHOS 80 03/23/2023   AST 27 03/23/2023   ALT 33 03/23/2023   PROT 6.6 03/23/2023   ALBUMIN 4.2 03/23/2023   CALCIUM  9.7 05/05/2023   ANIONGAP 9 12/23/2021   EGFR 93 05/05/2023   Lab Results  Component Value Date   CHOL 102 03/23/2023   Lab Results  Component Value Date   HDL 37 (L) 03/23/2023   Lab Results  Component Value Date   LDLCALC 34 03/23/2023   Lab Results  Component Value Date   TRIG 194 (H) 03/23/2023   Lab Results  Component Value Date   CHOLHDL 2.8 03/23/2023   Lab Results  Component Value Date   HGBA1C 7.5 (H) 03/23/2023      Assessment & Plan:  Type 2 diabetes mellitus with hyperglycemia, without long-term current use of insulin  (HCC) Assessment & Plan: He takes metformin  500 mg in the morning and at bedtime. There are no reports of polyuria, polyphagia, or polydipsia. I encourage decreasing his intake of high-sugar foods and beverages, and increasing physical activity.  Lab Results  Component Value Date    HGBA1C 7.5 (H) 03/23/2023       Hypertension associated with type 2 diabetes mellitus (HCC) Assessment & Plan: Encouraged to continue lisinopril  40 mg daily A low-sodium diet of less than 2300 mg daily is recommended, along with increased physical activity of moderate intensity, aiming for 150 minutes weekly. The patient is encouraged to continue with these lifestyle modifications to help manage their blood pressure effectively.  BP Readings from Last 3 Encounters:  07/20/23 122/77  05/19/23 134/86  05/05/23 119/85      Hyperlipidemia associated with type 2 diabetes mellitus (HCC) Assessment & Plan: Encouraged to continue taking rosuvastatin  5 mg daily The patient was encouraged to make lifestyle changes, including avoiding simple carbohydrates such as cakes, sweet desserts, ice cream, soda (diet or regular), sweet tea, candies, chips, cookies, store-bought juices, excessive alcohol (more than 1-2 drinks per day), lemonade, artificial sweeteners, donuts, coffee creamers, and sugar-free products.  Additionally, reducing the consumption of greasy, fatty foods and increasing physical activity were recommended. The patient verbalized understanding and is aware of the plan of care.  Lab Results  Component Value Date   CHOL 102 03/23/2023   HDL 37 (L) 03/23/2023   LDLCALC 34 03/23/2023   TRIG 194 (H) 03/23/2023   CHOLHDL 2.8 03/23/2023     Orders: -     Lipid panel -     CMP14+EGFR -     CBC with Differential/Platelet  IFG (impaired fasting glucose) -     Hemoglobin A1c  Vitamin D  deficiency -     VITAMIN D   25 Hydroxy (Vit-D Deficiency, Fractures)  TSH (thyroid-stimulating hormone deficiency) -     TSH + free T4  Note: This chart has been completed using Engineer, Civil (consulting) software, and while attempts have been made to ensure accuracy, certain words and phrases may not be transcribed as intended.    Follow-up: Return in about 4 months (around 11/17/2023).   Orvetta Danielski, FNP

## 2023-07-21 DIAGNOSIS — E1169 Type 2 diabetes mellitus with other specified complication: Secondary | ICD-10-CM | POA: Diagnosis not present

## 2023-07-21 DIAGNOSIS — E785 Hyperlipidemia, unspecified: Secondary | ICD-10-CM | POA: Diagnosis not present

## 2023-07-21 DIAGNOSIS — E559 Vitamin D deficiency, unspecified: Secondary | ICD-10-CM | POA: Diagnosis not present

## 2023-07-21 DIAGNOSIS — E038 Other specified hypothyroidism: Secondary | ICD-10-CM | POA: Diagnosis not present

## 2023-07-21 DIAGNOSIS — R7301 Impaired fasting glucose: Secondary | ICD-10-CM | POA: Diagnosis not present

## 2023-07-21 NOTE — Assessment & Plan Note (Addendum)
 Encouraged to continue taking rosuvastatin  5 mg daily The patient was encouraged to make lifestyle changes, including avoiding simple carbohydrates such as cakes, sweet desserts, ice cream, soda (diet or regular), sweet tea, candies, chips, cookies, store-bought juices, excessive alcohol (more than 1-2 drinks per day), lemonade, artificial sweeteners, donuts, coffee creamers, and sugar-free products.  Additionally, reducing the consumption of greasy, fatty foods and increasing physical activity were recommended. The patient verbalized understanding and is aware of the plan of care.  Lab Results  Component Value Date   CHOL 102 03/23/2023   HDL 37 (L) 03/23/2023   LDLCALC 34 03/23/2023   TRIG 194 (H) 03/23/2023   CHOLHDL 2.8 03/23/2023

## 2023-07-21 NOTE — Assessment & Plan Note (Signed)
 Encouraged to continue lisinopril  40 mg daily A low-sodium diet of less than 2300 mg daily is recommended, along with increased physical activity of moderate intensity, aiming for 150 minutes weekly. The patient is encouraged to continue with these lifestyle modifications to help manage their blood pressure effectively.  BP Readings from Last 3 Encounters:  07/20/23 122/77  05/19/23 134/86  05/05/23 119/85

## 2023-07-21 NOTE — Assessment & Plan Note (Signed)
 He takes metformin  500 mg in the morning and at bedtime. There are no reports of polyuria, polyphagia, or polydipsia. I encourage decreasing his intake of high-sugar foods and beverages, and increasing physical activity.  Lab Results  Component Value Date   HGBA1C 7.5 (H) 03/23/2023

## 2023-07-22 LAB — LIPID PANEL
Chol/HDL Ratio: 3.9 {ratio} (ref 0.0–5.0)
Cholesterol, Total: 136 mg/dL (ref 100–199)
HDL: 35 mg/dL — ABNORMAL LOW (ref >39)
LDL Chol Calc (NIH): 67 mg/dL (ref 0–99)
Triglycerides: 208 mg/dL — ABNORMAL HIGH (ref 0–149)
VLDL Cholesterol Cal: 34 mg/dL (ref 5–40)

## 2023-07-22 LAB — CBC WITH DIFFERENTIAL/PLATELET
Basophils Absolute: 0 10*3/uL (ref 0.0–0.2)
Basos: 0 %
EOS (ABSOLUTE): 0.5 10*3/uL — ABNORMAL HIGH (ref 0.0–0.4)
Eos: 4 %
Hematocrit: 48.6 % (ref 37.5–51.0)
Hemoglobin: 16.4 g/dL (ref 13.0–17.7)
Immature Grans (Abs): 0.1 10*3/uL (ref 0.0–0.1)
Immature Granulocytes: 1 %
Lymphocytes Absolute: 2.1 10*3/uL (ref 0.7–3.1)
Lymphs: 18 %
MCH: 32.3 pg (ref 26.6–33.0)
MCHC: 33.7 g/dL (ref 31.5–35.7)
MCV: 96 fL (ref 79–97)
Monocytes Absolute: 0.7 10*3/uL (ref 0.1–0.9)
Monocytes: 6 %
Neutrophils Absolute: 8.4 10*3/uL — ABNORMAL HIGH (ref 1.4–7.0)
Neutrophils: 71 %
Platelets: 214 10*3/uL (ref 150–450)
RBC: 5.08 x10E6/uL (ref 4.14–5.80)
RDW: 11.9 % (ref 11.6–15.4)
WBC: 11.8 10*3/uL — ABNORMAL HIGH (ref 3.4–10.8)

## 2023-07-22 LAB — TSH+FREE T4
Free T4: 1.19 ng/dL (ref 0.82–1.77)
TSH: 2.57 u[IU]/mL (ref 0.450–4.500)

## 2023-07-22 LAB — CMP14+EGFR
ALT: 33 [IU]/L (ref 0–44)
AST: 24 [IU]/L (ref 0–40)
Albumin: 4.4 g/dL (ref 3.8–4.9)
Alkaline Phosphatase: 83 [IU]/L (ref 44–121)
BUN/Creatinine Ratio: 15 (ref 9–20)
BUN: 15 mg/dL (ref 6–24)
Bilirubin Total: 0.5 mg/dL (ref 0.0–1.2)
CO2: 20 mmol/L (ref 20–29)
Calcium: 10 mg/dL (ref 8.7–10.2)
Chloride: 106 mmol/L (ref 96–106)
Creatinine, Ser: 0.98 mg/dL (ref 0.76–1.27)
Globulin, Total: 1.9 g/dL (ref 1.5–4.5)
Glucose: 128 mg/dL — ABNORMAL HIGH (ref 70–99)
Potassium: 4.5 mmol/L (ref 3.5–5.2)
Sodium: 142 mmol/L (ref 134–144)
Total Protein: 6.3 g/dL (ref 6.0–8.5)
eGFR: 93 mL/min/{1.73_m2} (ref >59)

## 2023-07-22 LAB — HEMOGLOBIN A1C
Est. average glucose Bld gHb Est-mCnc: 160 mg/dL
Hgb A1c MFr Bld: 7.2 % — ABNORMAL HIGH (ref 4.8–5.6)

## 2023-07-22 LAB — VITAMIN D 25 HYDROXY (VIT D DEFICIENCY, FRACTURES): Vit D, 25-Hydroxy: 52.1 ng/mL (ref 30.0–100.0)

## 2023-07-23 ENCOUNTER — Other Ambulatory Visit: Payer: Self-pay | Admitting: Family Medicine

## 2023-07-23 DIAGNOSIS — E1169 Type 2 diabetes mellitus with other specified complication: Secondary | ICD-10-CM

## 2023-07-23 MED ORDER — ROSUVASTATIN CALCIUM 10 MG PO TABS
10.0000 mg | ORAL_TABLET | Freq: Every day | ORAL | 1 refills | Status: DC
  Filled 2023-10-02: qty 90, 90d supply, fill #0
  Filled 2024-01-10: qty 30, 30d supply, fill #1

## 2023-07-27 ENCOUNTER — Ambulatory Visit (INDEPENDENT_AMBULATORY_CARE_PROVIDER_SITE_OTHER): Payer: 59 | Admitting: Urology

## 2023-07-27 VITALS — BP 140/88 | HR 77

## 2023-07-27 DIAGNOSIS — R7989 Other specified abnormal findings of blood chemistry: Secondary | ICD-10-CM

## 2023-07-27 DIAGNOSIS — R3129 Other microscopic hematuria: Secondary | ICD-10-CM

## 2023-07-27 LAB — URINALYSIS, ROUTINE W REFLEX MICROSCOPIC
Bilirubin, UA: NEGATIVE
Glucose, UA: NEGATIVE
Ketones, UA: NEGATIVE
Leukocytes,UA: NEGATIVE
Nitrite, UA: NEGATIVE
Specific Gravity, UA: 1.03 (ref 1.005–1.030)
Urobilinogen, Ur: 0.2 mg/dL (ref 0.2–1.0)
pH, UA: 6 (ref 5.0–7.5)

## 2023-07-27 LAB — MICROSCOPIC EXAMINATION
Bacteria, UA: NONE SEEN
WBC, UA: NONE SEEN /[HPF] (ref 0–5)

## 2023-07-27 NOTE — Progress Notes (Signed)
07/27/2023 9:14 AM   Chase Christensen 1970-09-22 [id number]  Referring provider: Gilmore Laroche, FNP [address] [place],  [place] [address]  No chief complaint on file.   HPI:  F/u -     1) low T - pt with a T of 262 in Sep 2024. Free 6, PSA 1.6, Hct 48.6. He has a child. No scrotal or inguinal surgery. Symptoms of fatigue, weight gain (although lost 10 lbs over past few weeks due to cough, congestion, "cold" symptoms), type II DM, low libido. He has ED and tried sildenafil 100 mg.   Repeat testosterone October 2024 was 180.  January 2025 hematocrit 48.6.  Today, seen for the above. He has MH today and prior history. Vol fireman and smoker.    He works at [employer] and now [employer]. Married Nov 2024.    PMH: Past Medical History:  Diagnosis Date   COPD (chronic obstructive pulmonary disease) (HCC)    Diabetes (HCC) 09/17/2021   ED (erectile dysfunction)    Gout    Hypertension    Pneumonia    Prediabetes 09/18/2020    Surgical History: Past Surgical History:  Procedure Laterality Date   KNEE ARTHROSCOPY Right 03/15/2015   WISDOM TOOTH EXTRACTION      Home Medications:  Allergies as of 07/27/2023       Reactions   Penicillins Rash        Medication List        Accurate as of July 27, 2023  9:14 AM. If you have any questions, ask your nurse or doctor.          albuterol 108 (90 Base) MCG/ACT inhaler Commonly known as: VENTOLIN HFA Inhale 2 puffs into the lungs every 6 (six) hours as needed for wheezing or shortness of breath.   allopurinol 100 MG tablet Commonly known as: ZYLOPRIM Take 2 tablets (200 mg total) by mouth daily.   amLODipine 10 MG tablet Commonly known as: NORVASC Take 1 tablet (10 mg total) by mouth daily.   [facility]S Budesonide 32 MCG/ACT nasal spray Generic drug: budesonide Place into both nostrils.   ipratropium 0.03 % nasal spray Commonly known as: ATROVENT PLEASE SEE ATTACHED FOR DETAILED DIRECTIONS    lidocaine 2 % solution Commonly known as: XYLOCAINE Use as directed 15 mLs in the mouth or throat as needed.   lisinopril 40 MG tablet Commonly known as: ZESTRIL Take 1 tablet (40 mg total) by mouth daily.   metFORMIN 500 MG 24 hr tablet Commonly known as: GLUCOPHAGE-XR Take 1 tablet (500 mg total) by mouth 2 (two) times daily with a meal.   metFORMIN 500 MG 24 hr tablet Commonly known as: GLUCOPHAGE-XR Take 1 tablet (500 mg total) by mouth 2 (two) times daily with a meal.   pantoprazole 40 MG tablet Commonly known as: PROTONIX Take 1 tablet (40 mg total) by mouth daily before breakfast.   rosuvastatin 10 MG tablet Commonly known as: Crestor Take 1 tablet (10 mg total) by mouth daily.   sildenafil 100 MG tablet Commonly known as: VIAGRA Take 1 tablet (100 mg total) by mouth daily as needed for erectile dysfunction.   Spiriva Respimat 2.5 MCG/ACT Aers Generic drug: Tiotropium Bromide Monohydrate Inhale 2 puffs into the lungs daily.   Vitamin D (Ergocalciferol) 1.25 MG (50000 UNIT) Caps capsule Commonly known as: DRISDOL Take 1 capsule (50,000 Units total) by mouth every 7 (seven) days.        Allergies:  Allergies  Allergen Reactions   Penicillins Rash    Family History: Family History  Problem Relation Age of Onset   Hypertension Mother    Prostate cancer Father    Hypotension Father    Colon cancer Paternal Uncle    Diabetes Maternal Grandmother    Lung cancer Maternal Grandfather    Pancreatic cancer Paternal Grandmother    Colon polyps Cousin    Cancer Other        all paternal uncles and anunts passed with cancer    Social History:  reports that he has been smoking cigarettes. He has quit using smokeless tobacco.  His smokeless tobacco use included chew. He reports current alcohol use. He reports that he does not use drugs.   Physical Exam: There were no vitals taken for this visit.  Constitutional:  Alert and oriented, No acute  distress. HEENT: [place] AT, moist mucus membranes.  Trachea midline, no masses. Cardiovascular: No clubbing, cyanosis, or edema. Respiratory: Normal respiratory effort, no increased work of breathing. GI: Abdomen is soft, nontender, nondistended, no abdominal masses GU: No CVA tenderness Skin: No rashes, bruises or suspicious lesions. Neurologic: Grossly intact, no focal deficits, moving all 4 extremities. Psychiatric: Normal mood and affect.  Laboratory Data: Lab Results  Component Value Date   WBC 11.8 (H) 07/21/2023   HGB 16.4 07/21/2023   HCT 48.6 07/21/2023   MCV 96 07/21/2023   PLT 214 07/21/2023    Lab Results  Component Value Date   CREATININE 0.98 07/21/2023    No results found for: "PSA"  Lab Results  Component Value Date   TESTOSTERONE 180 (L) 05/01/2023    Lab Results  Component Value Date   HGBA1C 7.2 (H) 07/21/2023    Urinalysis    Component Value Date/Time   COLORURINE COLORLESS (A) 12/23/2021 2328   APPEARANCEUR Clear 07/30/2022 1512   LABSPEC 1.033 (H) 12/23/2021 2328   PHURINE 5.0 12/23/2021 2328   GLUCOSEU Negative 07/30/2022 1512   HGBUR NEGATIVE 12/23/2021 2328   BILIRUBINUR Negative 07/30/2022 1512   KETONESUR negative 06/26/2022 1733   KETONESUR NEGATIVE 12/23/2021 2328   PROTEINUR Negative 07/30/2022 1512   PROTEINUR NEGATIVE 12/23/2021 2328   UROBILINOGEN 0.2 06/26/2022 1733   NITRITE Negative 07/30/2022 1512   NITRITE NEGATIVE 12/23/2021 2328   LEUKOCYTESUR Negative 07/30/2022 1512   LEUKOCYTESUR NEGATIVE 12/23/2021 2328    Lab Results  Component Value Date   LABMICR 34.3 03/23/2023   WBCUA 0-5 07/30/2022   RBCUA 0-2 08/12/2017   LABEPIT None seen 07/30/2022   MUCUS Present 08/12/2017   BACTERIA Few (A) 07/30/2022    Pertinent Imaging: none  Assessment & Plan:    1. Low testosterone in male (Primary) Confirmatory labs were sent this morning-testosterone, bio T, LH, estradiol and prolactin. Disc nature r/b of clomid or T  replacement. Disc traverse study data.   2. MH -   No follow-ups on file.  Jerilee Field, MD  [facility]  [address] [place], [place] [address] [phone]

## 2023-08-03 ENCOUNTER — Ambulatory Visit (HOSPITAL_COMMUNITY)
Admission: RE | Admit: 2023-08-03 | Discharge: 2023-08-03 | Disposition: A | Discharge status: Home / Self Care | Payer: 59 | Source: Ambulatory Visit | Attending: Urology | Admitting: Urology

## 2023-08-03 DIAGNOSIS — K429 Umbilical hernia without obstruction or gangrene: Secondary | ICD-10-CM | POA: Diagnosis not present

## 2023-08-03 DIAGNOSIS — R3129 Other microscopic hematuria: Secondary | ICD-10-CM | POA: Diagnosis not present

## 2023-08-03 DIAGNOSIS — R310 Gross hematuria: Secondary | ICD-10-CM | POA: Diagnosis not present

## 2023-08-03 DIAGNOSIS — K5730 Diverticulosis of large intestine without perforation or abscess without bleeding: Secondary | ICD-10-CM | POA: Diagnosis not present

## 2023-08-03 DIAGNOSIS — N281 Cyst of kidney, acquired: Secondary | ICD-10-CM | POA: Diagnosis not present

## 2023-08-03 MED ORDER — IOHEXOL 300 MG/ML  SOLN
125.0000 mL | Freq: Once | INTRAMUSCULAR | Status: AC | PRN
  Administered 2023-08-03: 125 mL via INTRAVENOUS

## 2023-08-06 ENCOUNTER — Encounter: Payer: Self-pay | Admitting: Urology

## 2023-08-07 NOTE — Telephone Encounter (Signed)
 FYI and advise

## 2023-08-10 NOTE — Telephone Encounter (Signed)
Will notify the Pt. The only labs I see are the prolactin, luteinizing hormone, estradiol and testosterone Free Total and it looks like they were done her

## 2023-08-10 NOTE — Telephone Encounter (Signed)
FYI routed to the wrong MD

## 2023-08-17 LAB — TESTOSTERONE, FREE, TOTAL, SHBG
Sex Hormone Binding: 22.4 nmol/L (ref 19.3–76.4)
Testosterone, Free: 8.3 pg/mL (ref 7.2–24.0)
Testosterone: 362 ng/dL (ref 264–916)

## 2023-08-17 LAB — LUTEINIZING HORMONE: LH: 4.2 m[IU]/mL (ref 1.7–8.6)

## 2023-08-17 LAB — PROLACTIN: Prolactin: 5.4 ng/mL (ref 3.6–25.2)

## 2023-08-17 LAB — ESTRADIOL, FREE
Estradiol, Serum, MS: 8.4 pg/mL
Free Estradiol, Percent: 3.1 %
Free Estradiol, Serum: 0.26 pg/mL

## 2023-08-21 NOTE — Telephone Encounter (Signed)
See Pt question below

## 2023-09-07 ENCOUNTER — Ambulatory Visit: Payer: 59 | Admitting: Urology

## 2023-09-07 VITALS — BP 142/92 | HR 83

## 2023-09-07 DIAGNOSIS — Z87898 Personal history of other specified conditions: Secondary | ICD-10-CM | POA: Diagnosis not present

## 2023-09-07 DIAGNOSIS — R7989 Other specified abnormal findings of blood chemistry: Secondary | ICD-10-CM | POA: Diagnosis not present

## 2023-09-07 DIAGNOSIS — R3129 Other microscopic hematuria: Secondary | ICD-10-CM

## 2023-09-07 LAB — URINALYSIS, ROUTINE W REFLEX MICROSCOPIC
Bilirubin, UA: NEGATIVE
Glucose, UA: NEGATIVE
Ketones, UA: NEGATIVE
Leukocytes,UA: NEGATIVE
Nitrite, UA: NEGATIVE
Specific Gravity, UA: 1.025 (ref 1.005–1.030)
Urobilinogen, Ur: 0.2 mg/dL (ref 0.2–1.0)
pH, UA: 6 (ref 5.0–7.5)

## 2023-09-07 LAB — MICROSCOPIC EXAMINATION: Bacteria, UA: NONE SEEN

## 2023-09-07 MED ORDER — CIPROFLOXACIN HCL 500 MG PO TABS
500.0000 mg | ORAL_TABLET | Freq: Once | ORAL | Status: AC
  Administered 2023-09-07: 500 mg via ORAL

## 2023-09-07 NOTE — Progress Notes (Unsigned)
  [place]   09/07/23  CC: No chief complaint on file.   HPI:  F/u -     1) low T - pt with a T of 262 in Sep 2024. Free 6, PSA 1.6, Hct 48.6. He has a child. No scrotal or inguinal surgery. Symptoms of fatigue, weight gain (although lost 10 lbs over past few weeks due to cough, congestion, "cold" symptoms), type II DM, low libido. He has ED and tried sildenafil 100 mg.    Repeat testosterone October 2024 was 180.  January 2025 hematocrit 48.6.Repeat Jan 2025 T 362, free T 8.3, E 8.4, LH 4.2, P 5.4.    2) MH - noted on Jan 2025 UA. Feb 2025 CT benign. Simple RUP cyst 4 cm, smaller left. Small fat contain umbilical and inguinal hernia. Vol fireman and smoker.   Today, seen for 1) cystoscopy for MH eval and 2) management of low T and 3) eval for inguinal hernia.    He works at [employer] and now [employer]. Married Nov 2024.   Blood pressure (!) 142/92, pulse 83. NED. A&Ox3.   No respiratory distress   Abd soft, NT, ND Normal phallus with bilateral descended testicles  Cystoscopy Procedure Note  Patient identification was confirmed, informed consent was obtained, and patient was prepped using Betadine solution.  Lidocaine jelly was administered per urethral meatus.     Pre-Procedure: - Inspection reveals: -no umbilical hernia   -small fat contianing umbilical hernia superiorly; laxity more on the left but no inguinal hernia  -a normal caliber ureteral meatus. Glans normal, scrotum normal.   Procedure: The flexible cystoscope was introduced without difficulty - No urethral strictures/lesions are present. - normal prostate - short, borderline obs  - normal bladder neck - Bilateral ureteral orifices identified - Bladder mucosa  reveals no ulcers, tumors, or lesions - No bladder stones - No trabeculation  Retroflexion shows normal bladder and BN   Post-Procedure: - Patient tolerated the procedure well  Assessment/ Plan:  1) MH - benign eval - Discussed  cysts and hernia.   2) low T - repeated T level. Discussed etiology of low T. Consider clomid.   No follow-ups on file.  Jerilee Field, MD

## 2023-09-16 ENCOUNTER — Other Ambulatory Visit: Payer: Self-pay | Admitting: Family Medicine

## 2023-09-16 DIAGNOSIS — E1159 Type 2 diabetes mellitus with other circulatory complications: Secondary | ICD-10-CM

## 2023-09-16 DIAGNOSIS — M109 Gout, unspecified: Secondary | ICD-10-CM

## 2023-09-16 DIAGNOSIS — E1165 Type 2 diabetes mellitus with hyperglycemia: Secondary | ICD-10-CM

## 2023-09-16 DIAGNOSIS — K219 Gastro-esophageal reflux disease without esophagitis: Secondary | ICD-10-CM

## 2023-10-01 ENCOUNTER — Other Ambulatory Visit (HOSPITAL_COMMUNITY): Payer: Self-pay

## 2023-10-02 ENCOUNTER — Other Ambulatory Visit (HOSPITAL_COMMUNITY): Payer: Self-pay

## 2023-10-02 ENCOUNTER — Other Ambulatory Visit: Payer: Self-pay

## 2023-10-02 MED ORDER — METFORMIN HCL ER 500 MG PO TB24
500.0000 mg | ORAL_TABLET | Freq: Two times a day (BID) | ORAL | 1 refills | Status: DC
  Filled 2023-11-17: qty 120, 60d supply, fill #0

## 2023-11-16 ENCOUNTER — Encounter (HOSPITAL_COMMUNITY): Payer: Self-pay

## 2023-11-17 ENCOUNTER — Other Ambulatory Visit: Payer: Self-pay | Admitting: Family Medicine

## 2023-11-17 ENCOUNTER — Other Ambulatory Visit (HOSPITAL_COMMUNITY): Payer: Self-pay

## 2023-11-17 MED ORDER — METFORMIN HCL ER 500 MG PO TB24
500.0000 mg | ORAL_TABLET | Freq: Two times a day (BID) | ORAL | 1 refills | Status: DC
  Filled 2023-11-17: qty 180, 90d supply, fill #0

## 2023-11-19 ENCOUNTER — Other Ambulatory Visit (HOSPITAL_COMMUNITY): Payer: Self-pay

## 2023-11-20 ENCOUNTER — Other Ambulatory Visit (HOSPITAL_COMMUNITY): Payer: Self-pay

## 2023-11-24 ENCOUNTER — Other Ambulatory Visit (HOSPITAL_COMMUNITY): Payer: Self-pay

## 2023-11-25 ENCOUNTER — Other Ambulatory Visit (HOSPITAL_COMMUNITY): Payer: Self-pay

## 2023-11-26 ENCOUNTER — Other Ambulatory Visit (HOSPITAL_COMMUNITY): Payer: Self-pay

## 2023-11-26 MED ORDER — LISINOPRIL 40 MG PO TABS
40.0000 mg | ORAL_TABLET | Freq: Every day | ORAL | 3 refills | Status: DC
  Filled 2023-11-26: qty 90, 90d supply, fill #0

## 2024-01-11 ENCOUNTER — Other Ambulatory Visit: Payer: Self-pay | Admitting: Family Medicine

## 2024-01-11 ENCOUNTER — Other Ambulatory Visit (HOSPITAL_COMMUNITY): Payer: Self-pay

## 2024-01-11 ENCOUNTER — Ambulatory Visit: Admitting: Urology

## 2024-01-11 ENCOUNTER — Other Ambulatory Visit: Payer: Self-pay

## 2024-01-11 DIAGNOSIS — I152 Hypertension secondary to endocrine disorders: Secondary | ICD-10-CM

## 2024-01-11 DIAGNOSIS — M109 Gout, unspecified: Secondary | ICD-10-CM

## 2024-01-11 DIAGNOSIS — K219 Gastro-esophageal reflux disease without esophagitis: Secondary | ICD-10-CM

## 2024-01-11 MED ORDER — AMLODIPINE BESYLATE 10 MG PO TABS
10.0000 mg | ORAL_TABLET | Freq: Every day | ORAL | 3 refills | Status: DC
  Filled 2024-01-11: qty 90, 90d supply, fill #0

## 2024-01-11 MED ORDER — ALLOPURINOL 100 MG PO TABS
200.0000 mg | ORAL_TABLET | Freq: Every day | ORAL | 3 refills | Status: DC
  Filled 2024-01-11: qty 180, 90d supply, fill #0

## 2024-01-12 ENCOUNTER — Other Ambulatory Visit: Payer: Self-pay

## 2024-01-12 ENCOUNTER — Other Ambulatory Visit (HOSPITAL_COMMUNITY): Payer: Self-pay

## 2024-01-12 MED ORDER — PANTOPRAZOLE SODIUM 40 MG PO TBEC
40.0000 mg | DELAYED_RELEASE_TABLET | Freq: Every day | ORAL | 3 refills | Status: AC
  Filled 2024-01-12: qty 90, 90d supply, fill #0
  Filled 2024-04-14: qty 90, 90d supply, fill #1
  Filled 2024-06-07 – 2024-06-25 (×3): qty 90, 90d supply, fill #2
  Filled 2024-08-05: qty 90, 90d supply, fill #3

## 2024-01-13 ENCOUNTER — Telehealth: Admitting: Family Medicine

## 2024-01-13 DIAGNOSIS — J441 Chronic obstructive pulmonary disease with (acute) exacerbation: Secondary | ICD-10-CM

## 2024-01-13 DIAGNOSIS — J3089 Other allergic rhinitis: Secondary | ICD-10-CM | POA: Diagnosis not present

## 2024-01-13 MED ORDER — GUAIFENESIN ER 600 MG PO TB12: 1200.0000 mg | ORAL_TABLET | Freq: Two times a day (BID) | ORAL | 0 refills | Status: AC | PRN

## 2024-01-13 MED ORDER — IPRATROPIUM BROMIDE 0.03 % NA SOLN: 2.0000 | Freq: Two times a day (BID) | NASAL | 1 refills | Status: DC | PRN

## 2024-01-13 MED ORDER — PREDNISONE 20 MG PO TABS: 20.0000 mg | ORAL_TABLET | Freq: Two times a day (BID) | ORAL | 0 refills | Status: AC

## 2024-01-13 NOTE — Patient Instructions (Addendum)
 Chase Christensen, thank you for joining [name], FNP for today's virtual visit.  While this provider is not your primary care provider (PCP), if your PCP is located in our provider database this encounter information will be shared with them immediately following your visit.   A [facility] MyChart account gives you access to today's visit and all your visits, tests, and labs performed at [facility]  click here if you don't have a  MyChart account or go to [email or web address]  Consent: (Patient) Chase Christensen provided verbal consent for this virtual visit at the beginning of the encounter.  Current Medications:  Current Outpatient Medications:    guaiFENesin  (MUCINEX ) 600 MG 12 hr tablet, Take 2 tablets (1,200 mg total) by mouth 2 (two) times daily as needed for up to 7 days for cough or to loosen phlegm., Disp: 28 tablet, Rfl: 0   predniSONE  (DELTASONE ) 20 MG tablet, Take 1 tablet (20 mg total) by mouth 2 (two) times daily with a meal for 5 days., Disp: 10 tablet, Rfl: 0   albuterol  (VENTOLIN  HFA) 108 (90 Base) MCG/ACT inhaler, Inhale 2 puffs into the lungs every 6 (six) hours as needed for wheezing or shortness of breath., Disp: 8 g, Rfl: 0   allopurinol  (ZYLOPRIM ) 100 MG tablet, Take 2 tablets (200 mg total) by mouth daily., Disp: 180 tablet, Rfl: 3   amLODipine  (NORVASC ) 10 MG tablet, Take 1 tablet (10 mg total) by mouth daily., Disp: 90 tablet, Rfl: 3   ipratropium (ATROVENT ) 0.03 % nasal spray, Place 2 sprays into both nostrils 2 (two) times daily as needed for rhinitis., Disp: 30 mL, Rfl: 1   lisinopril  (ZESTRIL ) 40 MG tablet, TAKE 1 TABLET DAILY, Disp: 90 tablet, Rfl: 3   lisinopril  (ZESTRIL ) 40 MG tablet, Take 1 tablet (40 mg total) by mouth daily., Disp: 90 tablet, Rfl: 3   metFORMIN  (GLUCOPHAGE -XR) 500 MG 24 hr tablet, Take 1 tablet (500 mg total) by mouth 2 (two) times daily with a meal., Disp: 90 tablet, Rfl: 0   metFORMIN  (GLUCOPHAGE -XR) 500 MG 24  hr tablet, Take 1 tablet (500 mg total) by mouth 2 (two) times daily with a meal., Disp: 180 tablet, Rfl: 3   metFORMIN  (GLUCOPHAGE -XR) 500 MG 24 hr tablet, Take 1 tablet (500 mg total) by mouth 2 (two) times daily with food., Disp: 180 tablet, Rfl: 1   pantoprazole  (PROTONIX ) 40 MG tablet, Take 1 tablet (40 mg total) by mouth daily before breakfast., Disp: 90 tablet, Rfl: 3   rosuvastatin  (CRESTOR ) 10 MG tablet, Take 1 tablet (10 mg total) by mouth daily., Disp: 90 tablet, Rfl: 1   sildenafil  (VIAGRA ) 100 MG tablet, Take 1 tablet (100 mg total) by mouth daily as needed for erectile dysfunction., Disp: 30 tablet, Rfl: 1   Tiotropium Bromide Monohydrate  (SPIRIVA  RESPIMAT) 2.5 MCG/ACT AERS, Inhale 2 puffs into the lungs daily., Disp: 12 g, Rfl: 1   Vitamin D , Ergocalciferol , (DRISDOL ) 1.25 MG (50000 UNIT) CAPS capsule, Take 1 capsule (50,000 Units total) by mouth every 7 (seven) days., Disp: 20 capsule, Rfl: 1   Medications ordered in this encounter:  Meds ordered this encounter  Medications   ipratropium (ATROVENT ) 0.03 % nasal spray    Sig: Place 2 sprays into both nostrils 2 (two) times daily as needed for rhinitis.    Dispense:  30 mL    Refill:  1    Supervising Provider:   BLAISE ALEENE KIDD [[id number]]   predniSONE  (DELTASONE ) 20 MG  tablet    Sig: Take 1 tablet (20 mg total) by mouth 2 (two) times daily with a meal for 5 days.    Dispense:  10 tablet    Refill:  0    Supervising Provider:   LAMPTEY, PHILIP O [[id number]]   guaiFENesin  (MUCINEX ) 600 MG 12 hr tablet    Sig: Take 2 tablets (1,200 mg total) by mouth 2 (two) times daily as needed for up to 7 days for cough or to loosen phlegm.    Dispense:  28 tablet    Refill:  0    Supervising Provider:   BLAISE ALEENE KIDD [[id number]]     *If you need refills on other medications prior to your next appointment, please contact your pharmacy*  Follow-Up: Call back or seek an in-person evaluation if the symptoms worsen or if the condition fails  to improve as anticipated.  [facility] [phone]  Other Instructions Continue Mucinex  1200 mg twice daily as needed for the next 7 to 10 days Start prednisone  20 mg 1 in the morning and 1 at night for the next 5 days Start Atrovent  nasal spray for nasal drainage Discussed if symptoms are worsening including shortness of breath or new fevers I would recommend evaluation in person and likely chest x-ray. Due to short duration of symptoms discussed that antibiotics are not indicated at this time but may be needed if symptoms do not improve. I would recommend monitoring your blood sugars closely due to your diabetes and the steroid along with being sick will increase your blood sugars.  I have sent a work note to Pharmacologist. You can find by going to the Menu on your homepage, scrolling down to the Communications section, and selecting Letters. Let us  know if you have any issue locating.   If you have been instructed to have an in-person evaluation today at a local Urgent Care facility, please use the link below. It will take you to a list of all of our available [facility], including address, phone number and hours of operation. Please do not delay care.  [facility]  If you or a family member do not have a primary care provider, use the link below to schedule a visit and establish care. When you choose a [facility] primary care physician or advanced practice provider, you gain a long-term partner in health. Find a Primary Care Provider  Learn more about 's in-office and virtual care options:  - Get Care Now

## 2024-01-13 NOTE — Progress Notes (Signed)
 Virtual Visit Consent   Chase Christensen, you are scheduled for a virtual visit with a [name] provider today. Just as with appointments in the office, your consent must be obtained to participate. Your consent will be active for this visit and any virtual visit you may have with one of our providers in the next 365 days. If you have a MyChart account, a copy of this consent can be sent to you electronically.  As this is a virtual visit, video technology does not allow for your provider to perform a traditional examination. This may limit your provider's ability to fully assess your condition. If your provider identifies any concerns that need to be evaluated in person or the need to arrange testing (such as labs, EKG, etc.), we will make arrangements to do so. Although advances in technology are sophisticated, we cannot ensure that it will always work on either your end or our end. If the connection with a video visit is poor, the visit may have to be switched to a telephone visit. With either a video or telephone visit, we are not always able to ensure that we have a secure connection.  By engaging in this virtual visit, you consent to the provision of healthcare and authorize for your insurance to be billed (if applicable) for the services provided during this visit. Depending on your insurance coverage, you may receive a charge related to this service.  I need to obtain your verbal consent now. Are you willing to proceed with your visit today? Chase Christensen has provided verbal consent on 01/13/2024 for a virtual visit (video or telephone). Chase DELENA Darby, FNP  Date: 01/13/2024 12:26 PM   Virtual Visit via Video Note   I, Chase Christensen, connected with  Chase Christensen  ([id number], 1971-01-13) on 01/13/24 at 12:15 PM EDT by a video-enabled telemedicine application and verified that I am speaking with the correct person using two identifiers.  Location: Patient: Virtual Visit Location Patient:  Home Provider: Virtual Visit Location Provider: Home Office   I discussed the limitations of evaluation and management by telemedicine and the availability of in person appointments. The patient expressed understanding and agreed to proceed.    History of Present Illness: Chase Christensen is a 53 y.o. who identifies as a male who was assigned male at birth, and is being seen today for upper respiratory symptoms. Started 3 days ago. Scratchy throat. Coughing up green stuff started yesterday. Temp 99.5 today. No sweats yesterday but felt cold all day. No shortness of breath with activity. Not going outside right now. Smoking currently 1 pack in the last week. BG readings this week have been 87-122 fasting. He has had some wheezing but has not used his albuterol . Typically only needed with illness. Stuffy but no sinus pain or pressure. Mucinex  DM yesterday 2- 12 hour tablets twice yesterday. Drinking plenty of fluids but appetite is down.   Wife has a sinus infection and has been sick since last week.  HPI:  Problems:  Patient Active Problem List   Diagnosis Date Noted   Leukocytosis 05/07/2023   Upper respiratory infection 04/17/2023   Nasal congestion 03/20/2023   Hyperlipidemia associated with type 2 diabetes mellitus (HCC) 03/20/2023   Nocturnal hypoxia 08/25/2022   Mediastinal lymphadenopathy 05/07/2022   Type 2 diabetes mellitus with hyperglycemia, without long-term current use of insulin  (HCC) 09/17/2021   COPD with acute exacerbation (HCC) 03/19/2021   Controlled gout 09/13/2020   Gastroesophageal reflux disease 08/27/2017  Erectile dysfunction associated with type 2 diabetes mellitus (HCC) 06/08/2015   Hypertension associated with type 2 diabetes mellitus (HCC) 05/03/2015   Tobacco use 05/03/2015    Allergies:  Allergies  Allergen Reactions   Penicillins Rash   Medications:  Current Outpatient Medications:    guaiFENesin  (MUCINEX ) 600 MG 12 hr tablet, Take 2 tablets (1,200 mg  total) by mouth 2 (two) times daily as needed for up to 7 days for cough or to loosen phlegm., Disp: 28 tablet, Rfl: 0   predniSONE  (DELTASONE ) 20 MG tablet, Take 1 tablet (20 mg total) by mouth 2 (two) times daily with a meal for 5 days., Disp: 10 tablet, Rfl: 0   albuterol  (VENTOLIN  HFA) 108 (90 Base) MCG/ACT inhaler, Inhale 2 puffs into the lungs every 6 (six) hours as needed for wheezing or shortness of breath., Disp: 8 g, Rfl: 0   allopurinol  (ZYLOPRIM ) 100 MG tablet, Take 2 tablets (200 mg total) by mouth daily., Disp: 180 tablet, Rfl: 3   amLODipine  (NORVASC ) 10 MG tablet, Take 1 tablet (10 mg total) by mouth daily., Disp: 90 tablet, Rfl: 3   ipratropium (ATROVENT ) 0.03 % nasal spray, Place 2 sprays into both nostrils 2 (two) times daily as needed for rhinitis., Disp: 30 mL, Rfl: 1   lisinopril  (ZESTRIL ) 40 MG tablet, TAKE 1 TABLET DAILY, Disp: 90 tablet, Rfl: 3   lisinopril  (ZESTRIL ) 40 MG tablet, Take 1 tablet (40 mg total) by mouth daily., Disp: 90 tablet, Rfl: 3   metFORMIN  (GLUCOPHAGE -XR) 500 MG 24 hr tablet, Take 1 tablet (500 mg total) by mouth 2 (two) times daily with a meal., Disp: 90 tablet, Rfl: 0   metFORMIN  (GLUCOPHAGE -XR) 500 MG 24 hr tablet, Take 1 tablet (500 mg total) by mouth 2 (two) times daily with a meal., Disp: 180 tablet, Rfl: 3   metFORMIN  (GLUCOPHAGE -XR) 500 MG 24 hr tablet, Take 1 tablet (500 mg total) by mouth 2 (two) times daily with food., Disp: 180 tablet, Rfl: 1   pantoprazole  (PROTONIX ) 40 MG tablet, Take 1 tablet (40 mg total) by mouth daily before breakfast., Disp: 90 tablet, Rfl: 3   rosuvastatin  (CRESTOR ) 10 MG tablet, Take 1 tablet (10 mg total) by mouth daily., Disp: 90 tablet, Rfl: 1   sildenafil  (VIAGRA ) 100 MG tablet, Take 1 tablet (100 mg total) by mouth daily as needed for erectile dysfunction., Disp: 30 tablet, Rfl: 1   Tiotropium Bromide Monohydrate  (SPIRIVA  RESPIMAT) 2.5 MCG/ACT AERS, Inhale 2 puffs into the lungs daily., Disp: 12 g, Rfl: 1    Vitamin D , Ergocalciferol , (DRISDOL ) 1.25 MG (50000 UNIT) CAPS capsule, Take 1 capsule (50,000 Units total) by mouth every 7 (seven) days., Disp: 20 capsule, Rfl: 1  Observations/Objective: Patient is well-developed, well-nourished in no acute respiratory distress but does appear unwell. Resting comfortably at home.  Head is normocephalic, atraumatic.  No labored breathing. Speaking in full sentences. Speech is clear and coherent with logical content.  Patient is alert and oriented at baseline.   Assessment and Plan: 1. COPD with acute exacerbation (HCC) (Primary) - predniSONE  (DELTASONE ) 20 MG tablet; Take 1 tablet (20 mg total) by mouth 2 (two) times daily with a meal for 5 days.  Dispense: 10 tablet; Refill: 0 - guaiFENesin  (MUCINEX ) 600 MG 12 hr tablet; Take 2 tablets (1,200 mg total) by mouth 2 (two) times daily as needed for up to 7 days for cough or to loosen phlegm.  Dispense: 28 tablet; Refill: 0  2. Non-seasonal allergic rhinitis due to  other allergic trigger - ipratropium (ATROVENT ) 0.03 % nasal spray; Place 2 sprays into both nostrils 2 (two) times daily as needed for rhinitis.  Dispense: 30 mL; Refill: 1  Continue Mucinex  1200 mg twice daily as needed for the next 7 to 10 days Start prednisone  20 mg 1 in the morning and 1 at night for the next 5 days Start Atrovent  nasal spray for nasal drainage Discussed if symptoms are worsening including shortness of breath or new fevers I would recommend evaluation in person and likely include a chest x-ray. Due to short duration of symptoms discussed that antibiotics are not indicated at this time but may be needed if symptoms do not improve. I would recommend monitoring your blood sugars closely due to your diabetes and the steroid along with being sick will increase your blood sugars.  The patient is provided a work excuse note for today and tomorrow if needed.  Follow Up Instructions: I discussed the assessment and treatment plan with  the patient. The patient was provided an opportunity to ask questions and all were answered. The patient agreed with the plan and demonstrated an understanding of the instructions.  A copy of instructions were sent to the patient via MyChart unless otherwise noted below.    The patient was advised to call back or seek an in-person evaluation if the symptoms worsen or if the condition fails to improve as anticipated.    Chase DELENA Darby, FNP

## 2024-01-25 ENCOUNTER — Other Ambulatory Visit (HOSPITAL_COMMUNITY): Payer: Self-pay

## 2024-01-25 ENCOUNTER — Ambulatory Visit
Admission: EM | Admit: 2024-01-25 | Discharge: 2024-01-25 | Disposition: A | Discharge status: Home / Self Care | Attending: Family Medicine | Admitting: Family Medicine

## 2024-01-25 ENCOUNTER — Other Ambulatory Visit: Payer: Self-pay | Admitting: Family Medicine

## 2024-01-25 DIAGNOSIS — E1169 Type 2 diabetes mellitus with other specified complication: Secondary | ICD-10-CM

## 2024-01-25 DIAGNOSIS — Z23 Encounter for immunization: Secondary | ICD-10-CM

## 2024-01-25 DIAGNOSIS — S81811A Laceration without foreign body, right lower leg, initial encounter: Secondary | ICD-10-CM | POA: Diagnosis not present

## 2024-01-25 DIAGNOSIS — E559 Vitamin D deficiency, unspecified: Secondary | ICD-10-CM

## 2024-01-25 MED ORDER — BACITRACIN 500 UNIT/GM EX OINT
1.0000 | TOPICAL_OINTMENT | Freq: Once | CUTANEOUS | Status: AC
  Administered 2024-01-25: 1 via TOPICAL

## 2024-01-25 MED ORDER — TETANUS-DIPHTH-ACELL PERTUSSIS 5-2.5-18.5 LF-MCG/0.5 IM SUSY
0.5000 mL | PREFILLED_SYRINGE | Freq: Once | INTRAMUSCULAR | Status: AC
  Administered 2024-01-25: 0.5 mL via INTRAMUSCULAR

## 2024-01-25 MED ORDER — VITAMIN D (ERGOCALCIFEROL) 1.25 MG (50000 UNIT) PO CAPS
50000.0000 [IU] | ORAL_CAPSULE | ORAL | 1 refills | Status: DC
  Filled 2024-01-25: qty 20, 140d supply, fill #0
  Filled 2024-02-03: qty 4, 28d supply, fill #0

## 2024-01-25 MED ORDER — CHLORHEXIDINE GLUCONATE 4 % EX SOLN: Freq: Every day | CUTANEOUS | 0 refills | Status: DC | PRN

## 2024-01-25 MED ORDER — DOXYCYCLINE HYCLATE 100 MG PO CAPS: 100.0000 mg | ORAL_CAPSULE | Freq: Two times a day (BID) | ORAL | 0 refills | Status: DC

## 2024-01-25 MED ORDER — ROSUVASTATIN CALCIUM 10 MG PO TABS
10.0000 mg | ORAL_TABLET | Freq: Every day | ORAL | 1 refills | Status: DC
  Filled 2024-01-25 – 2024-02-03 (×2): qty 90, 90d supply, fill #0

## 2024-01-25 MED ORDER — MUPIROCIN 2 % EX OINT: 1.0000 | TOPICAL_OINTMENT | Freq: Two times a day (BID) | CUTANEOUS | 0 refills | Status: DC

## 2024-01-25 NOTE — Telephone Encounter (Signed)
 Copied from CRM [id number]. Topic: Clinical - Medication Refill >> Jan 25, 2024  9:35 AM Delon HERO wrote: Medication: Rx #: [id number]  Vitamin D , Ergocalciferol , (DRISDOL ) 1.25 MG (50000 UNIT) CAPS capsule [[id number]]   Rx #: [id number]  rosuvastatin  (CRESTOR ) 10 MG tablet [[id number]]    Has the patient contacted their pharmacy? Yes (Agent: If no, request that the patient contact the pharmacy for the refill. If patient does not wish to contact the pharmacy document the reason why and proceed with request.) (Agent: If yes, when and what did the pharmacy advise?)  This is the patient's preferred pharmacy:   [facility] [address] [place] [address] Phone: [phone] Fax: [phone]  Is this the correct pharmacy for this prescription? Yes If no, delete pharmacy and type the correct one.   Has the prescription been filled recently? Yes  Is the patient out of the medication? Yes  Has the patient been seen for an appointment in the last year OR does the patient have an upcoming appointment? Yes  Can we respond through MyChart? Yes  Agent: Please be advised that Rx refills may take up to 3 business days. We ask that you follow-up with your pharmacy.

## 2024-01-25 NOTE — ED Triage Notes (Signed)
 Pt states he hit his right calf on a point on a plow today. Pt now has a medium size laceration on right calf.

## 2024-01-25 NOTE — Discharge Instructions (Signed)
 Clean the area about twice a day with the Hibiclens  solution and apply the mupirocin  ointment and a nonstick gauze pad.  Use Coban wrap to secure the gauze pad in place.  Elevate at rest to help with swelling.  Take the full course of antibiotics.  Follow-up for worsening symptoms at any time

## 2024-01-25 NOTE — ED Provider Notes (Signed)
 [facility]    CSN: [id number] Arrival date & time: 01/25/24  1512      History   Chief Complaint Chief Complaint  Patient presents with   Laceration    HPI Chase Christensen is a 53 y.o. male.   Patient presenting today with a laceration to the right lower leg that occurred when he walked into a pointed area on a plow earlier today.  He denies uncontrolled bleeding, decreased range of motion, numbness, tingling, weakness to the leg.  So far not trying anything over-the-counter for symptoms.  Does not recall when his last tetanus shot was.    Past Medical History:  Diagnosis Date   COPD (chronic obstructive pulmonary disease) (HCC)    Diabetes (HCC) 09/17/2021   ED (erectile dysfunction)    Gout    Hypertension    Pneumonia    Prediabetes 09/18/2020    Patient Active Problem List   Diagnosis Date Noted   Leukocytosis 05/07/2023   Upper respiratory infection 04/17/2023   Nasal congestion 03/20/2023   Hyperlipidemia associated with type 2 diabetes mellitus (HCC) 03/20/2023   Nocturnal hypoxia 08/25/2022   Mediastinal lymphadenopathy 05/07/2022   Type 2 diabetes mellitus with hyperglycemia, without long-term current use of insulin  (HCC) 09/17/2021   COPD with acute exacerbation (HCC) 03/19/2021   Controlled gout 09/13/2020   Gastroesophageal reflux disease 08/27/2017   Erectile dysfunction associated with type 2 diabetes mellitus (HCC) 06/08/2015   Hypertension associated with type 2 diabetes mellitus (HCC) 05/03/2015   Tobacco use 05/03/2015    Past Surgical History:  Procedure Laterality Date   KNEE ARTHROSCOPY Right 03/15/2015   WISDOM TOOTH EXTRACTION         Home Medications    Prior to Admission medications   Medication Sig Start Date End Date Taking? Authorizing Provider  albuterol  (VENTOLIN  HFA) 108 (90 Base) MCG/ACT inhaler Inhale 2 puffs into the lungs every 6 (six) hours as needed for wheezing or shortness of breath. 04/16/23  Yes  Leath-Warren, Etta PARAS, NP  allopurinol  (ZYLOPRIM ) 100 MG tablet Take 2 tablets (200 mg total) by mouth daily. 01/11/24  Yes Zarwolo, Gloria, FNP  amLODipine  (NORVASC ) 10 MG tablet Take 1 tablet (10 mg total) by mouth daily. 01/11/24  Yes Zarwolo, Gloria, FNP  chlorhexidine  (HIBICLENS ) 4 % external liquid Apply topically daily as needed. 01/25/24  Yes Stuart Vernell Norris, PA-C  doxycycline  (VIBRAMYCIN ) 100 MG capsule Take 1 capsule (100 mg total) by mouth 2 (two) times daily. 01/25/24  Yes Stuart Vernell Norris, PA-C  lisinopril  (ZESTRIL ) 40 MG tablet TAKE 1 TABLET DAILY 09/16/23  Yes Zarwolo, Gloria, FNP  metFORMIN  (GLUCOPHAGE -XR) 500 MG 24 hr tablet Take 1 tablet (500 mg total) by mouth 2 (two) times daily with a meal. 03/20/23  Yes Zarwolo, Gloria, FNP  mupirocin  ointment (BACTROBAN ) 2 % Apply 1 Application topically 2 (two) times daily. 01/25/24  Yes Stuart Vernell Norris, PA-C  pantoprazole  (PROTONIX ) 40 MG tablet Take 1 tablet (40 mg total) by mouth daily before breakfast. 01/12/24  Yes Zarwolo, Gloria, FNP  rosuvastatin  (CRESTOR ) 10 MG tablet Take 1 tablet (10 mg total) by mouth daily. 01/25/24  Yes Zarwolo, Gloria, FNP  sildenafil  (VIAGRA ) 100 MG tablet Take 1 tablet (100 mg total) by mouth daily as needed for erectile dysfunction. 03/19/21  Yes Merlynn Eland F, FNP  Tiotropium Bromide Monohydrate  (SPIRIVA  RESPIMAT) 2.5 MCG/ACT AERS Inhale 2 puffs into the lungs daily. 03/20/23  Yes Zarwolo, Gloria, FNP  Vitamin D , Ergocalciferol , (DRISDOL ) 1.25 MG (50000  UNIT) CAPS capsule Take 1 capsule (50,000 Units total) by mouth every 7 (seven) days. 03/28/23  Yes Zarwolo, Gloria, FNP  ipratropium (ATROVENT ) 0.03 % nasal spray Place 2 sprays into both nostrils 2 (two) times daily as needed for rhinitis. 01/13/24   Sophronia Olam LABOR, FNP  lisinopril  (ZESTRIL ) 40 MG tablet Take 1 tablet (40 mg total) by mouth daily. 09/16/23     metFORMIN  (GLUCOPHAGE -XR) 500 MG 24 hr tablet Take 1 tablet (500 mg total) by mouth 2 (two)  times daily with a meal. 07/02/23 06/26/24  Zarwolo, Gloria, FNP  metFORMIN  (GLUCOPHAGE -XR) 500 MG 24 hr tablet Take 1 tablet (500 mg total) by mouth 2 (two) times daily with food. 11/17/23   Zarwolo, Gloria, FNP    Family History Family History  Problem Relation Age of Onset   Hypertension Mother    Prostate cancer Father    Hypotension Father    Colon cancer Paternal Uncle    Diabetes Maternal Grandmother    Lung cancer Maternal Grandfather    Pancreatic cancer Paternal Grandmother    Colon polyps Cousin    Cancer Other        all paternal uncles and anunts passed with cancer    Social History Social History   Tobacco Use   Smoking status: Some Days    Current packs/day: 0.25    Types: Cigarettes   Smokeless tobacco: Former    Types: Engineer, drilling   Vaping status: Never Used  Substance Use Topics   Alcohol use: Yes    Comment: occas   Drug use: No     Allergies   Penicillins   Review of Systems Review of Systems Per HPI  Physical Exam Triage Vital Signs ED Triage Vitals  Encounter Vitals Group     BP 01/25/24 1526 100/72     Girls Systolic BP Percentile --      Girls Diastolic BP Percentile --      Boys Systolic BP Percentile --      Boys Diastolic BP Percentile --      Pulse Rate 01/25/24 1526 85     Resp 01/25/24 1526 14     Temp 01/25/24 1526 98 F (36.7 C)     Temp Source 01/25/24 1526 Oral     SpO2 01/25/24 1526 90 %     Weight --      Height --      Head Circumference --      Peak Flow --      Pain Score 01/25/24 1527 7     Pain Loc --      Pain Education --      Exclude from Growth Chart --    No data found.  Updated Vital Signs BP 100/72 (BP Location: Right Arm)   Pulse 85   Temp 98 F (36.7 C) (Oral)   Resp 14   SpO2 91%   Visual Acuity Right Eye Distance:   Left Eye Distance:   Bilateral Distance:    Right Eye Near:   Left Eye Near:    Bilateral Near:     Physical Exam Vitals and nursing note reviewed.   Constitutional:      Appearance: Normal appearance.  HENT:     Head: Atraumatic.  Eyes:     Extraocular Movements: Extraocular movements intact.     Conjunctiva/sclera: Conjunctivae normal.  Cardiovascular:     Rate and Rhythm: Normal rate.  Pulmonary:     Effort: Pulmonary effort is normal.  Musculoskeletal:        General: Normal range of motion.     Cervical back: Normal range of motion and neck supple.  Skin:    General: Skin is warm.     Comments: Laceration to right lower leg more of a puncture/avulsion than a linear laceration.  Wound edges not able to be approximated.  Bleeding well-controlled.  No foreign body appreciable  Neurological:     Mental Status: He is oriented to person, place, and time.     Comments: Right lower extremity neurovascularly intact  Psychiatric:        Mood and Affect: Mood normal.        Thought Content: Thought content normal.        Judgment: Judgment normal.      UC Treatments / Results  Labs (all labs ordered are listed, but only abnormal results are displayed) Labs Reviewed - No data to display  EKG   Radiology No results found.  Procedures Procedures (including critical care time)  Medications Ordered in UC Medications  Tdap (BOOSTRIX) injection 0.5 mL (0.5 mLs Intramuscular Given 01/25/24 1620)  bacitracin  ointment 1 Application (1 Application Topical Given 01/25/24 1631)    Initial Impression / Assessment and Plan / UC Course  I have reviewed the triage vital signs and the nursing notes.  Pertinent labs & imaging results that were available during my care of the patient were reviewed by me and considered in my medical decision making (see chart for details).     Tdap updated, wound thoroughly cleaned, dressed with bacitracin , nonstick dressing and will place on a course of doxycycline  and discussed home wound care with Hibiclens , Bactroban , nonstick dressings.  Leg elevation at rest.  Return for worsening  symptoms.  Final Clinical Impressions(s) / UC Diagnoses   Final diagnoses:  Laceration of right lower extremity, initial encounter  Need for Tdap vaccination     Discharge Instructions      Clean the area about twice a day with the Hibiclens  solution and apply the mupirocin  ointment and a nonstick gauze pad.  Use Coban wrap to secure the gauze pad in place.  Elevate at rest to help with swelling.  Take the full course of antibiotics.  Follow-up for worsening symptoms at any time    ED Prescriptions     Medication Sig Dispense Auth. Provider   doxycycline  (VIBRAMYCIN ) 100 MG capsule Take 1 capsule (100 mg total) by mouth 2 (two) times daily. 14 capsule Stuart Vernell Norris, PA-C   mupirocin  ointment (BACTROBAN ) 2 % Apply 1 Application topically 2 (two) times daily. 80 g Stuart Vernell Norris, PA-C   chlorhexidine  (HIBICLENS ) 4 % external liquid Apply topically daily as needed. 236 mL Stuart Vernell Norris, [place]      PDMP not reviewed this encounter.   Stuart Vernell Norris, PA-C 01/25/24 1747

## 2024-01-26 ENCOUNTER — Other Ambulatory Visit (HOSPITAL_COMMUNITY): Payer: Self-pay

## 2024-02-03 ENCOUNTER — Other Ambulatory Visit (HOSPITAL_COMMUNITY): Payer: Self-pay

## 2024-02-03 ENCOUNTER — Other Ambulatory Visit: Payer: Self-pay

## 2024-02-05 ENCOUNTER — Ambulatory Visit: Payer: Self-pay | Admitting: Family Medicine

## 2024-02-05 ENCOUNTER — Encounter: Payer: Self-pay | Admitting: Family Medicine

## 2024-02-05 ENCOUNTER — Ambulatory Visit (HOSPITAL_COMMUNITY)
Admission: RE | Admit: 2024-02-05 | Discharge: 2024-02-05 | Disposition: A | Discharge status: Home / Self Care | Source: Ambulatory Visit | Attending: Family Medicine | Admitting: Family Medicine

## 2024-02-05 VITALS — BP 125/84 | HR 75 | Ht 75.0 in | Wt 249.8 lb

## 2024-02-05 DIAGNOSIS — E1165 Type 2 diabetes mellitus with hyperglycemia: Secondary | ICD-10-CM

## 2024-02-05 DIAGNOSIS — M79604 Pain in right leg: Secondary | ICD-10-CM

## 2024-02-05 DIAGNOSIS — E1159 Type 2 diabetes mellitus with other circulatory complications: Secondary | ICD-10-CM

## 2024-02-05 DIAGNOSIS — S81811D Laceration without foreign body, right lower leg, subsequent encounter: Secondary | ICD-10-CM

## 2024-02-05 DIAGNOSIS — I152 Hypertension secondary to endocrine disorders: Secondary | ICD-10-CM

## 2024-02-05 DIAGNOSIS — M7989 Other specified soft tissue disorders: Secondary | ICD-10-CM | POA: Diagnosis not present

## 2024-02-05 DIAGNOSIS — M7651 Patellar tendinitis, right knee: Secondary | ICD-10-CM | POA: Diagnosis not present

## 2024-02-05 DIAGNOSIS — S81811A Laceration without foreign body, right lower leg, initial encounter: Secondary | ICD-10-CM | POA: Insufficient documentation

## 2024-02-05 MED ORDER — DOXYCYCLINE HYCLATE 100 MG PO TABS
100.0000 mg | ORAL_TABLET | Freq: Two times a day (BID) | ORAL | 0 refills | Status: DC
Start: 2024-02-05 — End: 2024-03-28

## 2024-02-05 NOTE — Assessment & Plan Note (Signed)
 Controlled, no change in medication

## 2024-02-05 NOTE — Assessment & Plan Note (Signed)
 Wound still open , appears as though this was deep, healing cXR, cBC and diff Refer Ortho to follow wound Additional 10 day course of doxycycline  prescribed

## 2024-02-05 NOTE — Assessment & Plan Note (Signed)
 F/u with PCP as before Sub optimal control when last checked

## 2024-02-05 NOTE — Progress Notes (Signed)
   Chase Christensen     MRN: [id number]      DOB: 07/05/1971  Chief Complaint  Patient presents with   Wound Check    HPI Chase Christensen is here TO F/U laceration to right leg sustained 01/25/2024 Treated at UC, there  has been marked improvement in wound , however , still has significant tenderness distal to wound approx 4 cm ling, also there is clear drainage with pressure over that area where there is no obviously broken skin. Denies fever , chills , purulent drainage, has been cleaning , dressing and leaving to air as instructed and has completed the antibiotic course prescribed Pt is diabetic Denies polyuria, polydipsia, blurred vision , or hypoglycemic episodes.   ROS See HPI  . Denies skin break down or rash.   PE  BP 125/84   Pulse 75   Ht 6' 3 (1.905 m)   Wt 249 lb 12 oz (113.3 kg)   SpO2 93%   BMI 31.22 kg/m   Patient alert and oriented and in no cardiopulmonary distress.  HEENT: No facial asymmetry, EOMI,     Neck supple .  Chest: Clear to auscultation bilaterally.  CVS: S1, S2 no murmurs, no S3.Regular rate.  .   Ext:  edema and tenderness of RLE  MS: Adequate ROM spine, shoulders, hips and knees.  Skin: open , though healing wound of right leg lateral aspect, approx 2 cm open, erythematous border, tenderness and slight swelling distal to wound no visible skin breakdown in area where there is clear exudate with indirecrt pressure  Psych: Good eye contact, normal affect. Memory intact not anxious or depressed appearing.  CNS: CN 2-12 intact, power,  normal throughout.no focal deficits noted.   Assessment & Plan  Laceration of right lower leg Wound still open , appears as though this was deep, healing cXR, cBC and diff Refer Ortho to follow wound Additional 10 day course of doxycycline  prescribed  Hypertension associated with type 2 diabetes mellitus (HCC) Controlled, no change in medication   Type 2 diabetes mellitus with hyperglycemia, without  long-term current use of insulin  (HCC) F/u with PCP as before Sub optimal control when last checked

## 2024-02-05 NOTE — Patient Instructions (Signed)
 Follow-up as before with your primary care provider.  You are referred urgently to orthopedics for evaluation of the wound on your right leg.  10-day course of doxycycline  is prescribed for the infection of the right leg.  Please get an x-ray of your right leg after you leave the office and we are asking also that you get basic blood work done to check your blood count and kidney function this you will get in the office prior to leaving.  Thanks for choosing [facility], we consider it a privelige to serve you.

## 2024-02-06 LAB — BASIC METABOLIC PANEL WITH GFR
BUN/Creatinine Ratio: 15 (ref 9–20)
BUN: 15 mg/dL (ref 6–24)
CO2: 22 mmol/L (ref 20–29)
Calcium: 9.9 mg/dL (ref 8.7–10.2)
Chloride: 102 mmol/L (ref 96–106)
Creatinine, Ser: 0.99 mg/dL (ref 0.76–1.27)
Glucose: 162 mg/dL — ABNORMAL HIGH (ref 70–99)
Potassium: 4.6 mmol/L (ref 3.5–5.2)
Sodium: 140 mmol/L (ref 134–144)
eGFR: 92 mL/min/1.73 (ref >59)

## 2024-02-06 LAB — CBC WITH DIFFERENTIAL/PLATELET
Basophils Absolute: 0 x10E3/uL (ref 0.0–0.2)
Basos: 0 %
EOS (ABSOLUTE): 0.4 x10E3/uL (ref 0.0–0.4)
Eos: 4 %
Hematocrit: 47.1 % (ref 37.5–51.0)
Hemoglobin: 16.1 g/dL (ref 13.0–17.7)
Immature Grans (Abs): 0 x10E3/uL (ref 0.0–0.1)
Immature Granulocytes: 0 %
Lymphocytes Absolute: 2 x10E3/uL (ref 0.7–3.1)
Lymphs: 20 %
MCH: 33.3 pg — ABNORMAL HIGH (ref 26.6–33.0)
MCHC: 34.2 g/dL (ref 31.5–35.7)
MCV: 97 fL (ref 79–97)
Monocytes Absolute: 0.7 x10E3/uL (ref 0.1–0.9)
Monocytes: 7 %
Neutrophils Absolute: 6.6 x10E3/uL (ref 1.4–7.0)
Neutrophils: 69 %
Platelets: 221 x10E3/uL (ref 150–450)
RBC: 4.84 x10E6/uL (ref 4.14–5.80)
RDW: 12.1 % (ref 11.6–15.4)
WBC: 9.6 x10E3/uL (ref 3.4–10.8)

## 2024-02-08 DIAGNOSIS — Z7901 Long term (current) use of anticoagulants: Secondary | ICD-10-CM | POA: Diagnosis not present

## 2024-02-08 DIAGNOSIS — M79671 Pain in right foot: Secondary | ICD-10-CM | POA: Diagnosis not present

## 2024-02-08 DIAGNOSIS — Z79899 Other long term (current) drug therapy: Secondary | ICD-10-CM | POA: Diagnosis not present

## 2024-02-08 DIAGNOSIS — Z043 Encounter for examination and observation following other accident: Secondary | ICD-10-CM | POA: Diagnosis not present

## 2024-02-08 DIAGNOSIS — S8991XA Unspecified injury of right lower leg, initial encounter: Secondary | ICD-10-CM | POA: Diagnosis not present

## 2024-02-08 DIAGNOSIS — Z5321 Procedure and treatment not carried out due to patient leaving prior to being seen by health care provider: Secondary | ICD-10-CM | POA: Diagnosis not present

## 2024-02-09 ENCOUNTER — Encounter: Payer: Self-pay | Admitting: Orthopedic Surgery

## 2024-02-09 ENCOUNTER — Ambulatory Visit (INDEPENDENT_AMBULATORY_CARE_PROVIDER_SITE_OTHER): Admitting: Orthopedic Surgery

## 2024-02-09 DIAGNOSIS — L97911 Non-pressure chronic ulcer of unspecified part of right lower leg limited to breakdown of skin: Secondary | ICD-10-CM | POA: Diagnosis not present

## 2024-02-09 NOTE — Progress Notes (Signed)
 Office Visit Note   Patient: Chase Christensen           Date of Birth: 09/07/1970           MRN: [id number] Visit Date: 02/09/2024              Requested by: Antonetta Rollene BRAVO, MD [address] [place],  [place] [address] PCP: Zarwolo, Gloria, FNP  Chief Complaint  Patient presents with   Right Leg - Wound Check      HPI: Patient is a 53 year old gentleman is seen for initial evaluation for traumatic wound right leg.  Patient states he scraped his leg on the pointed area of a plow.  Patient has been on doxycycline .  Currently using Hibiclens .  Patient went to the emergency department yesterday due to increased pain.  Assessment & Plan: Visit Diagnoses:  1. Leg ulcer, right, limited to breakdown of skin (HCC)     Plan: Will provide a bottle of Vashe to start Vashe dressing changes daily.  Reevaluate in 1 week.  Follow-Up Instructions: No follow-ups on file.   Ortho Exam  Patient is alert, oriented, no adenopathy, well-dressed, normal affect, normal respiratory effort. Examination patient has a traumatic wound that measures 2 x 3 cm.  Patient does have venous insufficiency.  He has a palpable pulse.  Calf is nontender no evidence of a DVT.  Pain primarily over the anterior compartment.  There is no surrounding cellulitis.  There is clear lymphatic drainage.  No pain with dorsiflexion of the ankle.  No pain over the greater saphenous vein.  Patient did take colchicine  thinking it was gout but this did not change his symptoms.    Imaging: No results found. No images are attached to the encounter.  Labs: Lab Results  Component Value Date   HGBA1C 7.2 (H) 07/21/2023   HGBA1C 7.5 (H) 03/23/2023   HGBA1C 6.1 (H) 09/24/2022   LABURIC 6.8 10/13/2019   LABURIC 5.7 03/08/2019   LABURIC 9.2 (H) 03/02/2018   REPTSTATUS 06/29/2022 FINAL 06/26/2022   CULT >=100,000 COLONIES/mL ESCHERICHIA COLI (A) 06/26/2022   LABORGA ESCHERICHIA COLI (A) 06/26/2022     Lab Results   Component Value Date   ALBUMIN 4.4 07/21/2023   ALBUMIN 4.2 03/23/2023   ALBUMIN 4.2 09/24/2022    No results found for: MG Lab Results  Component Value Date   VD25OH 52.1 07/21/2023   VD25OH 28.6 (L) 03/23/2023    No results found for: PREALBUMIN    Latest Ref Rng & Units 02/05/2024    9:01 AM 07/21/2023   10:26 AM 05/05/2023    9:52 AM  CBC EXTENDED  WBC 3.4 - 10.8 x10E3/uL 9.6  11.8  10.3   RBC 4.14 - 5.80 x10E6/uL 4.84  5.08  5.45   Hemoglobin 13.0 - 17.7 g/dL 83.8  83.5  82.3   HCT 37.5 - 51.0 % 47.1  48.6  51.7   Platelets 150 - 450 x10E3/uL 221  214  250   NEUT# 1.4 - 7.0 x10E3/uL 6.6  8.4  6.7   Lymph# 0.7 - 3.1 x10E3/uL 2.0  2.1  2.4      There is no height or weight on file to calculate BMI.  Orders:  No orders of the defined types were placed in this encounter.  No orders of the defined types were placed in this encounter.    Procedures: No procedures performed  Clinical Data: No additional findings.  ROS:  All other systems negative,  except as noted in the HPI. Review of Systems  Objective: Vital Signs: There were no vitals taken for this visit.  Specialty Comments:  No specialty comments available.  PMFS History: Patient Active Problem List   Diagnosis Date Noted   Laceration of right lower leg 02/05/2024   Right leg pain 02/05/2024   Leukocytosis 05/07/2023   Upper respiratory infection 04/17/2023   Nasal congestion 03/20/2023   Hyperlipidemia associated with type 2 diabetes mellitus (HCC) 03/20/2023   Nocturnal hypoxia 08/25/2022   Mediastinal lymphadenopathy 05/07/2022   Type 2 diabetes mellitus with hyperglycemia, without long-term current use of insulin  (HCC) 09/17/2021   COPD with acute exacerbation (HCC) 03/19/2021   Controlled gout 09/13/2020   Gastroesophageal reflux disease 08/27/2017   Erectile dysfunction associated with type 2 diabetes mellitus (HCC) 06/08/2015   Hypertension associated with type 2 diabetes mellitus  (HCC) 05/03/2015   Tobacco use 05/03/2015   Past Medical History:  Diagnosis Date   COPD (chronic obstructive pulmonary disease) (HCC)    Diabetes (HCC) 09/17/2021   ED (erectile dysfunction)    Gout    Hypertension    Pneumonia    Prediabetes 09/18/2020    Family History  Problem Relation Age of Onset   Hypertension Mother    Prostate cancer Father    Hypotension Father    Colon cancer Paternal Uncle    Diabetes Maternal Grandmother    Lung cancer Maternal Grandfather    Pancreatic cancer Paternal Grandmother    Colon polyps Cousin    Cancer Other        all paternal uncles and anunts passed with cancer    Past Surgical History:  Procedure Laterality Date   KNEE ARTHROSCOPY Right 03/15/2015   WISDOM TOOTH EXTRACTION     Social History   Occupational History   Occupation: servic tech/ [employer]   Occupation: Sports coach  Tobacco Use   Smoking status: Some Days    Current packs/day: 0.25    Types: Cigarettes   Smokeless tobacco: Former    Types: Engineer, drilling   Vaping status: Never Used  Substance and Sexual Activity   Alcohol use: Yes    Comment: occas   Drug use: No   Sexual activity: Yes

## 2024-02-12 ENCOUNTER — Ambulatory Visit: Admitting: Family

## 2024-02-15 ENCOUNTER — Encounter: Payer: Self-pay | Admitting: Orthopedic Surgery

## 2024-02-15 ENCOUNTER — Ambulatory Visit (INDEPENDENT_AMBULATORY_CARE_PROVIDER_SITE_OTHER): Admitting: Orthopedic Surgery

## 2024-02-15 DIAGNOSIS — L97911 Non-pressure chronic ulcer of unspecified part of right lower leg limited to breakdown of skin: Secondary | ICD-10-CM | POA: Diagnosis not present

## 2024-02-15 NOTE — Progress Notes (Signed)
 Office Visit Note   Patient: Chase Christensen           Date of Birth: 09/07/1970           MRN: [id number] Visit Date: 02/15/2024              Requested by: Zarwolo, Gloria, FNP [address] [place],  [place] [address] PCP: Zarwolo, Gloria, FNP  Chief Complaint  Patient presents with   Right Leg - Wound Check      HPI: Patient is a 53 year old gentleman who is seen in follow-up for traumatic ulcer of right leg.  Assessment & Plan: Visit Diagnoses:  1. Leg ulcer, right, limited to breakdown of skin (HCC)     Plan: Continue with Vashe dressing changes.  Follow-Up Instructions: Return in about 4 weeks (around 03/14/2024).   Ortho Exam  Patient is alert, oriented, no adenopathy, well-dressed, normal affect, normal respiratory effort. Examination the wound is healing well there is healthy granulation tissue.  There is no surrounding cellulitis no drainage.  The wound bed has flat granulation tissue that measures 1.5 x 2 cm.    Imaging: No results found. No images are attached to the encounter.  Labs: Lab Results  Component Value Date   HGBA1C 7.2 (H) 07/21/2023   HGBA1C 7.5 (H) 03/23/2023   HGBA1C 6.1 (H) 09/24/2022   LABURIC 6.8 10/13/2019   LABURIC 5.7 03/08/2019   LABURIC 9.2 (H) 03/02/2018   REPTSTATUS 06/29/2022 FINAL 06/26/2022   CULT >=100,000 COLONIES/mL ESCHERICHIA COLI (A) 06/26/2022   LABORGA ESCHERICHIA COLI (A) 06/26/2022     Lab Results  Component Value Date   ALBUMIN 4.4 07/21/2023   ALBUMIN 4.2 03/23/2023   ALBUMIN 4.2 09/24/2022    No results found for: MG Lab Results  Component Value Date   VD25OH 52.1 07/21/2023   VD25OH 28.6 (L) 03/23/2023    No results found for: PREALBUMIN    Latest Ref Rng & Units 02/05/2024    9:01 AM 07/21/2023   10:26 AM 05/05/2023    9:52 AM  CBC EXTENDED  WBC 3.4 - 10.8 x10E3/uL 9.6  11.8  10.3   RBC 4.14 - 5.80 x10E6/uL 4.84  5.08  5.45   Hemoglobin 13.0 - 17.7 g/dL 83.8  83.5  82.3   HCT 37.5 -  51.0 % 47.1  48.6  51.7   Platelets 150 - 450 x10E3/uL 221  214  250   NEUT# 1.4 - 7.0 x10E3/uL 6.6  8.4  6.7   Lymph# 0.7 - 3.1 x10E3/uL 2.0  2.1  2.4      There is no height or weight on file to calculate BMI.  Orders:  No orders of the defined types were placed in this encounter.  No orders of the defined types were placed in this encounter.    Procedures: No procedures performed  Clinical Data: No additional findings.  ROS:  All other systems negative, except as noted in the HPI. Review of Systems  Objective: Vital Signs: There were no vitals taken for this visit.  Specialty Comments:  No specialty comments available.  PMFS History: Patient Active Problem List   Diagnosis Date Noted   Laceration of right lower leg 02/05/2024   Right leg pain 02/05/2024   Leukocytosis 05/07/2023   Upper respiratory infection 04/17/2023   Nasal congestion 03/20/2023   Hyperlipidemia associated with type 2 diabetes mellitus (HCC) 03/20/2023   Nocturnal hypoxia 08/25/2022   Mediastinal lymphadenopathy 05/07/2022   Type 2 diabetes mellitus  with hyperglycemia, without long-term current use of insulin  (HCC) 09/17/2021   COPD with acute exacerbation (HCC) 03/19/2021   Controlled gout 09/13/2020   Gastroesophageal reflux disease 08/27/2017   Erectile dysfunction associated with type 2 diabetes mellitus (HCC) 06/08/2015   Hypertension associated with type 2 diabetes mellitus (HCC) 05/03/2015   Tobacco use 05/03/2015   Past Medical History:  Diagnosis Date   COPD (chronic obstructive pulmonary disease) (HCC)    Diabetes (HCC) 09/17/2021   ED (erectile dysfunction)    Gout    Hypertension    Pneumonia    Prediabetes 09/18/2020    Family History  Problem Relation Age of Onset   Hypertension Mother    Prostate cancer Father    Hypotension Father    Colon cancer Paternal Uncle    Diabetes Maternal Grandmother    Lung cancer Maternal Grandfather    Pancreatic cancer Paternal  Grandmother    Colon polyps Cousin    Cancer Other        all paternal uncles and anunts passed with cancer    Past Surgical History:  Procedure Laterality Date   KNEE ARTHROSCOPY Right 03/15/2015   WISDOM TOOTH EXTRACTION     Social History   Occupational History   Occupation: servic tech/ [employer]   Occupation: Sports coach  Tobacco Use   Smoking status: Some Days    Current packs/day: 0.25    Types: Cigarettes   Smokeless tobacco: Former    Types: Engineer, drilling   Vaping status: Never Used  Substance and Sexual Activity   Alcohol use: Yes    Comment: occas   Drug use: No   Sexual activity: Yes

## 2024-02-17 ENCOUNTER — Other Ambulatory Visit: Payer: Self-pay

## 2024-02-17 ENCOUNTER — Other Ambulatory Visit (HOSPITAL_COMMUNITY): Payer: Self-pay

## 2024-02-17 ENCOUNTER — Other Ambulatory Visit: Payer: Self-pay | Admitting: Family Medicine

## 2024-02-17 DIAGNOSIS — E1169 Type 2 diabetes mellitus with other specified complication: Secondary | ICD-10-CM

## 2024-02-17 MED ORDER — ROSUVASTATIN CALCIUM 10 MG PO TABS
10.0000 mg | ORAL_TABLET | Freq: Every day | ORAL | 1 refills | Status: DC
  Filled 2024-02-17 (×2): qty 90, 90d supply, fill #0

## 2024-02-17 NOTE — Telephone Encounter (Signed)
 Copied from CRM [id number]. Topic: Clinical - Medication Refill >> Feb 17, 2024 10:27 AM Donna BRAVO wrote: Medication: rosuvastatin  (CRESTOR ) 10 MG tablet   Has the patient contacted their pharmacy? Yes pharmacy stated to call provider for refill  This is the patient's preferred pharmacy:    [facility] [address] [place] [address] Phone: [phone] Fax: [phone]  Is this the correct pharmacy for this prescription? Yes If no, delete pharmacy and type the correct one.   Has the prescription been filled recently? Yes  Is the patient out of the medication? Yes  Has the patient been seen for an appointment in the last year OR does the patient have an upcoming appointment? Yes  Can we respond through MyChart? Yes  Agent: Please be advised that Rx refills may take up to 3 business days. We ask that you follow-up with your pharmacy.

## 2024-02-26 ENCOUNTER — Other Ambulatory Visit: Payer: Self-pay

## 2024-02-26 ENCOUNTER — Ambulatory Visit: Payer: Self-pay | Admitting: Family Medicine

## 2024-02-26 ENCOUNTER — Other Ambulatory Visit (HOSPITAL_COMMUNITY): Payer: Self-pay

## 2024-02-26 DIAGNOSIS — E1165 Type 2 diabetes mellitus with hyperglycemia: Secondary | ICD-10-CM | POA: Diagnosis not present

## 2024-02-26 DIAGNOSIS — E559 Vitamin D deficiency, unspecified: Secondary | ICD-10-CM

## 2024-02-26 DIAGNOSIS — E7849 Other hyperlipidemia: Secondary | ICD-10-CM | POA: Diagnosis not present

## 2024-02-26 DIAGNOSIS — J449 Chronic obstructive pulmonary disease, unspecified: Secondary | ICD-10-CM

## 2024-02-26 DIAGNOSIS — Z720 Tobacco use: Secondary | ICD-10-CM | POA: Diagnosis not present

## 2024-02-26 DIAGNOSIS — E785 Hyperlipidemia, unspecified: Secondary | ICD-10-CM

## 2024-02-26 DIAGNOSIS — E038 Other specified hypothyroidism: Secondary | ICD-10-CM | POA: Diagnosis not present

## 2024-02-26 DIAGNOSIS — E1159 Type 2 diabetes mellitus with other circulatory complications: Secondary | ICD-10-CM

## 2024-02-26 DIAGNOSIS — R7301 Impaired fasting glucose: Secondary | ICD-10-CM

## 2024-02-26 DIAGNOSIS — K219 Gastro-esophageal reflux disease without esophagitis: Secondary | ICD-10-CM

## 2024-02-26 DIAGNOSIS — E1169 Type 2 diabetes mellitus with other specified complication: Secondary | ICD-10-CM | POA: Diagnosis not present

## 2024-02-26 DIAGNOSIS — M109 Gout, unspecified: Secondary | ICD-10-CM | POA: Diagnosis not present

## 2024-02-26 DIAGNOSIS — I152 Hypertension secondary to endocrine disorders: Secondary | ICD-10-CM

## 2024-02-26 DIAGNOSIS — N529 Male erectile dysfunction, unspecified: Secondary | ICD-10-CM

## 2024-02-26 MED ORDER — LISINOPRIL 40 MG PO TABS
40.0000 mg | ORAL_TABLET | Freq: Every day | ORAL | 3 refills | Status: DC
  Filled 2024-02-26: qty 90, 90d supply, fill #0
  Filled 2024-04-14 – 2024-06-07 (×2): qty 90, 90d supply, fill #1

## 2024-02-26 MED ORDER — SILDENAFIL CITRATE 100 MG PO TABS
100.0000 mg | ORAL_TABLET | Freq: Every day | ORAL | 1 refills | Status: AC | PRN
  Filled 2024-02-26: qty 6, 30d supply, fill #0

## 2024-02-26 MED ORDER — ALBUTEROL SULFATE HFA 108 (90 BASE) MCG/ACT IN AERS
2.0000 | INHALATION_SPRAY | Freq: Four times a day (QID) | RESPIRATORY_TRACT | 2 refills | Status: AC | PRN
  Filled 2024-02-26: qty 6.7, 25d supply, fill #0
  Filled 2024-06-07: qty 6.7, 25d supply, fill #1

## 2024-02-26 MED ORDER — METFORMIN HCL ER 500 MG PO TB24
500.0000 mg | ORAL_TABLET | Freq: Two times a day (BID) | ORAL | 1 refills | Status: DC
  Filled 2024-02-26: qty 120, 60d supply, fill #0
  Filled 2024-04-14: qty 120, 60d supply, fill #1

## 2024-02-26 MED ORDER — AMLODIPINE BESYLATE 10 MG PO TABS
10.0000 mg | ORAL_TABLET | Freq: Every day | ORAL | 3 refills | Status: DC
  Filled 2024-02-26 – 2024-04-14 (×2): qty 90, 90d supply, fill #0
  Filled 2024-06-07: qty 90, 90d supply, fill #1

## 2024-02-26 MED ORDER — ALLOPURINOL 100 MG PO TABS
200.0000 mg | ORAL_TABLET | Freq: Every day | ORAL | 3 refills | Status: AC
  Filled 2024-02-26 – 2024-04-14 (×2): qty 180, 90d supply, fill #0
  Filled 2024-06-07 – 2024-08-05 (×2): qty 180, 90d supply, fill #1

## 2024-02-26 MED ORDER — ROSUVASTATIN CALCIUM 10 MG PO TABS
10.0000 mg | ORAL_TABLET | Freq: Every day | ORAL | 1 refills | Status: DC
  Filled 2024-02-26 – 2024-05-18 (×3): qty 90, 90d supply, fill #0
  Filled 2024-06-07: qty 90, 90d supply, fill #1

## 2024-02-26 NOTE — Patient Instructions (Signed)
 I appreciate the opportunity to provide care to you today!    Follow up:  5 months  Labs: please stop by the lab today to get your blood drawn (CBC, CMP, TSH, Lipid profile, HgA1c, Vit D)  For a Healthier YOU, I Recommend: Reducing your intake of sugar, sodium, carbohydrates, and saturated fats. Increasing your fiber intake by incorporating more whole grains, fruits, and vegetables into your meals. Setting healthy goals with a focus on lowering your consumption of carbs, sugar, and unhealthy fats. Adding variety to your diet by including a wide range of fruits and vegetables. Cutting back on soda and limiting processed foods as much as possible. Staying active: In addition to taking your weight loss medication, aim for at least 150 minutes of moderate-intensity physical activity each week for optimal results.   Please follow up if your symptoms worsen or fail to improve.     Please continue to a heart-healthy diet and increase your physical activities. Try to exercise for at least five days a week.    It was a pleasure to see you and I look forward to continuing to work together on your health and well-being. Please do not hesitate to call the office if you need care or have questions about your care.  In case of emergency, please visit the Emergency Department for urgent care, or contact our clinic at [phone] to schedule an appointment. We're here to help you!   Have a wonderful day and week. With Gratitude, Tniya Bowditch MSN, FNP-BC

## 2024-02-26 NOTE — Assessment & Plan Note (Signed)
 He takes metformin  500 mg in the morning and at bedtime. There are no reports of polyuria, polyphagia, or polydipsia. I encourage decreasing his intake of high-sugar foods and beverages, and increasing physical activity.  Lab Results  Component Value Date   HGBA1C 7.2 (H) 07/21/2023

## 2024-02-26 NOTE — Assessment & Plan Note (Signed)
 Encouraged to continue taking rosuvastatin  10 mg daily The patient was encouraged to make lifestyle changes, including avoiding simple carbohydrates such as cakes, sweet desserts, ice cream, soda (diet or regular), sweet tea, candies, chips, cookies, store-bought juices, excessive alcohol (more than 1-2 drinks per day), lemonade, artificial sweeteners, donuts, coffee creamers, and sugar-free products.  Additionally, reducing the consumption of greasy, fatty foods and increasing physical activity were recommended. The patient verbalized understanding and is aware of the plan of care.  Lab Results  Component Value Date   CHOL 136 07/21/2023   HDL 35 (L) 07/21/2023   LDLCALC 67 07/21/2023   TRIG 208 (H) 07/21/2023   CHOLHDL 3.9 07/21/2023

## 2024-02-26 NOTE — Assessment & Plan Note (Signed)
 Refilled allopurinol

## 2024-02-26 NOTE — Assessment & Plan Note (Signed)
 Encouraged to continue lisinopril  40 mg daily and amlodipine  10 mg  daily A low-sodium diet of less than 2300 mg daily is recommended, along with increased physical activity of moderate intensity, aiming for 150 minutes weekly. The patient is encouraged to continue with these lifestyle modifications to help manage their blood pressure effectively.  BP Readings from Last 3 Encounters:  02/05/24 125/84  01/25/24 100/72  09/07/23 (!) 142/92

## 2024-02-26 NOTE — Assessment & Plan Note (Signed)
 Refilled Protonix  GERD diet encouraged

## 2024-02-26 NOTE — Progress Notes (Signed)
 Established Patient Office Visit  Subjective:  Patient ID: Chase Christensen, male    DOB: Apr 21, 1971  Age: 53 y.o. MRN: [id number]  CC:  Chief Complaint  Patient presents with   Medication Refill    All meds     HPI Chase Christensen is a 53 y.o. male with past medical history of COPD, type II diabetes, tobacco use, hypertension, type II diabetes presents for f/u of  chronic medical conditions.  For the details of today's visit, please refer to the assessment and plan.    Past Medical History:  Diagnosis Date   COPD (chronic obstructive pulmonary disease) (HCC)    Diabetes (HCC) 09/17/2021   ED (erectile dysfunction)    Gout    Hypertension    Pneumonia    Prediabetes 09/18/2020    Past Surgical History:  Procedure Laterality Date   KNEE ARTHROSCOPY Right 03/15/2015   WISDOM TOOTH EXTRACTION      Family History  Problem Relation Age of Onset   Hypertension Mother    Prostate cancer Father    Hypotension Father    Colon cancer Paternal Uncle    Diabetes Maternal Grandmother    Lung cancer Maternal Grandfather    Pancreatic cancer Paternal Grandmother    Colon polyps Cousin    Cancer Other        all paternal uncles and anunts passed with cancer    Social History   Socioeconomic History   Marital status: Married    Spouse name: Not on file   Number of children: 1   Years of education: Not on file   Highest education level: 12th grade  Occupational History   Occupation: servic tech/ [employer]   Occupation: Sports coach  Tobacco Use   Smoking status: Some Days    Current packs/day: 0.25    Types: Cigarettes   Smokeless tobacco: Former    Types: Engineer, drilling   Vaping status: Never Used  Substance and Sexual Activity   Alcohol use: Yes    Comment: occas   Drug use: No   Sexual activity: Yes  Other Topics Concern   Not on file  Social History Narrative   Not on file   Social Drivers of Health   Financial Resource Strain: Low Risk   (03/16/2023)   Overall Financial Resource Strain (CARDIA)    Difficulty of Paying Living Expenses: Not hard at all  Food Insecurity: No Food Insecurity (03/16/2023)   Hunger Vital Sign    Worried About Running Out of Food in the Last Year: Never true    Ran Out of Food in the Last Year: Never true  Transportation Needs: No Transportation Needs (03/16/2023)   PRAPARE - Administrator, Civil Service (Medical): No    Lack of Transportation (Non-Medical): No  Physical Activity: Insufficiently Active (03/16/2023)   Exercise Vital Sign    Days of Exercise per Week: 3 days    Minutes of Exercise per Session: 30 min  Stress: No Stress Concern Present (03/16/2023)   [employer] of Occupational Health - Occupational Stress Questionnaire    Feeling of Stress : Not at all  Social Connections: Moderately Integrated (03/16/2023)   Social Connection and Isolation Panel    Frequency of Communication with Friends and Family: More than three times a week    Frequency of Social Gatherings with Friends and Family: More than three times a week    Attends Religious Services: 1 to 4 times per  year    Active Member of Clubs or Organizations: Yes    Attends Banker Meetings: More than 4 times per year    Marital Status: Divorced  Intimate Partner Violence: Not At Risk (02/08/2024)   Received from [facility]   HITS    Over the last 12 months how often did your partner physically hurt you?: Never    Over the last 12 months how often did your partner insult you or talk down to you?: Never    Over the last 12 months how often did your partner threaten you with physical harm?: Never    Over the last 12 months how often did your partner scream or curse at you?: Never    Outpatient Medications Prior to Visit  Medication Sig Dispense Refill   chlorhexidine  (HIBICLENS ) 4 % external liquid Apply topically daily as needed. 236 mL 0   ipratropium (ATROVENT ) 0.03 % nasal spray Place 2 sprays into  both nostrils 2 (two) times daily as needed for rhinitis. 30 mL 1   lisinopril  (ZESTRIL ) 40 MG tablet Take 1 tablet (40 mg total) by mouth daily. 90 tablet 3   metFORMIN  (GLUCOPHAGE -XR) 500 MG 24 hr tablet Take 1 tablet (500 mg total) by mouth 2 (two) times daily with a meal. 180 tablet 3   metFORMIN  (GLUCOPHAGE -XR) 500 MG 24 hr tablet Take 1 tablet (500 mg total) by mouth 2 (two) times daily with food. 180 tablet 1   pantoprazole  (PROTONIX ) 40 MG tablet Take 1 tablet (40 mg total) by mouth daily before breakfast. 90 tablet 3   Vitamin D , Ergocalciferol , (DRISDOL ) 1.25 MG (50000 UNIT) CAPS capsule Take 1 capsule (50,000 Units total) by mouth every 7 (seven) days. 20 capsule 1   albuterol  (VENTOLIN  HFA) 108 (90 Base) MCG/ACT inhaler Inhale 2 puffs into the lungs every 6 (six) hours as needed for wheezing or shortness of breath. 8 g 0   allopurinol  (ZYLOPRIM ) 100 MG tablet Take 2 tablets (200 mg total) by mouth daily. 180 tablet 3   amLODipine  (NORVASC ) 10 MG tablet Take 1 tablet (10 mg total) by mouth daily. 90 tablet 3   lisinopril  (ZESTRIL ) 40 MG tablet TAKE 1 TABLET DAILY 90 tablet 3   metFORMIN  (GLUCOPHAGE -XR) 500 MG 24 hr tablet Take 1 tablet (500 mg total) by mouth 2 (two) times daily with a meal. 90 tablet 0   rosuvastatin  (CRESTOR ) 10 MG tablet Take 1 tablet (10 mg total) by mouth daily. 90 tablet 1   sildenafil  (VIAGRA ) 100 MG tablet Take 1 tablet (100 mg total) by mouth daily as needed for erectile dysfunction. 30 tablet 1   doxycycline  (VIBRA -TABS) 100 MG tablet Take 1 tablet (100 mg total) by mouth 2 (two) times daily. (Patient not taking: Reported on 02/26/2024) 20 tablet 0   mupirocin  ointment (BACTROBAN ) 2 % Apply 1 Application topically 2 (two) times daily. (Patient not taking: Reported on 02/26/2024) 80 g 0   Tiotropium Bromide Monohydrate  (SPIRIVA  RESPIMAT) 2.5 MCG/ACT AERS Inhale 2 puffs into the lungs daily. (Patient not taking: Reported on 02/26/2024) 12 g 1   No  facility-administered medications prior to visit.    Allergies  Allergen Reactions   Penicillins Rash    ROS Review of Systems  Constitutional:  Negative for fatigue and fever.  Eyes:  Negative for visual disturbance.  Respiratory:  Negative for chest tightness and shortness of breath.   Cardiovascular:  Negative for chest pain and palpitations.  Neurological:  Negative for dizziness and  headaches.      Objective:    Physical Exam HENT:     Head: Normocephalic.     Right Ear: External ear normal.     Left Ear: External ear normal.     Nose: No congestion or rhinorrhea.     Mouth/Throat:     Mouth: Mucous membranes are moist.  Cardiovascular:     Rate and Rhythm: Regular rhythm.     Heart sounds: No murmur heard. Pulmonary:     Effort: No respiratory distress.     Breath sounds: Normal breath sounds.  Neurological:     Mental Status: He is alert.     There were no vitals taken for this visit. Wt Readings from Last 3 Encounters:  02/05/24 249 lb 12 oz (113.3 kg)  07/20/23 251 lb 1.9 oz (113.9 kg)  05/19/23 250 lb 1.9 oz (113.5 kg)    Lab Results  Component Value Date   TSH 2.570 07/21/2023   Lab Results  Component Value Date   WBC 9.6 02/05/2024   HGB 16.1 02/05/2024   HCT 47.1 02/05/2024   MCV 97 02/05/2024   PLT 221 02/05/2024   Lab Results  Component Value Date   NA 140 02/05/2024   K 4.6 02/05/2024   CO2 22 02/05/2024   GLUCOSE 162 (H) 02/05/2024   BUN 15 02/05/2024   CREATININE 0.99 02/05/2024   BILITOT 0.5 07/21/2023   ALKPHOS 83 07/21/2023   AST 24 07/21/2023   ALT 33 07/21/2023   PROT 6.3 07/21/2023   ALBUMIN 4.4 07/21/2023   CALCIUM  9.9 02/05/2024   ANIONGAP 9 12/23/2021   EGFR 92 02/05/2024   Lab Results  Component Value Date   CHOL 136 07/21/2023   Lab Results  Component Value Date   HDL 35 (L) 07/21/2023   Lab Results  Component Value Date   LDLCALC 67 07/21/2023   Lab Results  Component Value Date   TRIG 208 (H)  07/21/2023   Lab Results  Component Value Date   CHOLHDL 3.9 07/21/2023   Lab Results  Component Value Date   HGBA1C 7.2 (H) 07/21/2023      Assessment & Plan:  Hypertension associated with type 2 diabetes mellitus (HCC) Assessment & Plan: Encouraged to continue lisinopril  40 mg daily and amlodipine  10 mg  daily A low-sodium diet of less than 2300 mg daily is recommended, along with increased physical activity of moderate intensity, aiming for 150 minutes weekly. The patient is encouraged to continue with these lifestyle modifications to help manage their blood pressure effectively.  BP Readings from Last 3 Encounters:  02/05/24 125/84  01/25/24 100/72  09/07/23 (!) 142/92     Orders: -     Lisinopril ; Take 1 tablet (40 mg total) by mouth daily.  Dispense: 90 tablet; Refill: 3 -     amLODIPine  Besylate; Take 1 tablet (10 mg total) by mouth daily.  Dispense: 90 tablet; Refill: 3  Type 2 diabetes mellitus with hyperglycemia, without long-term current use of insulin  (HCC) Assessment & Plan: He takes metformin  500 mg in the morning and at bedtime. There are no reports of polyuria, polyphagia, or polydipsia. I encourage decreasing his intake of high-sugar foods and beverages, and increasing physical activity.  Lab Results  Component Value Date   HGBA1C 7.2 (H) 07/21/2023      Orders: -     metFORMIN  HCl ER; Take 1 tablet (500 mg total) by mouth 2 (two) times daily with a meal.  Dispense: 120 tablet;  Refill: 1 -     Lisinopril ; Take 1 tablet (40 mg total) by mouth daily.  Dispense: 90 tablet; Refill: 3 -     Microalbumin / creatinine urine ratio -     HM Diabetes Foot Exam  Tobacco use Assessment & Plan: Smokes about 1/2 pack/day  Asked about quitting: confirms that he currently smokes cigarettes Advise to quit smoking: Educated about QUITTING to reduce the risk of cancer, cardio and cerebrovascular disease. Assess willingness: Unwilling to quit at this time, but is  working on cutting back. Assist with counseling and pharmacotherapy: Counseled for 5 minutes and literature provided. Arrange for follow up: follow up in 3 months and continue to offer help.    Hyperlipidemia associated with type 2 diabetes mellitus (HCC) Assessment & Plan: Encouraged to continue taking rosuvastatin  10 mg daily The patient was encouraged to make lifestyle changes, including avoiding simple carbohydrates such as cakes, sweet desserts, ice cream, soda (diet or regular), sweet tea, candies, chips, cookies, store-bought juices, excessive alcohol (more than 1-2 drinks per day), lemonade, artificial sweeteners, donuts, coffee creamers, and sugar-free products.  Additionally, reducing the consumption of greasy, fatty foods and increasing physical activity were recommended. The patient verbalized understanding and is aware of the plan of care.  Lab Results  Component Value Date   CHOL 136 07/21/2023   HDL 35 (L) 07/21/2023   LDLCALC 67 07/21/2023   TRIG 208 (H) 07/21/2023   CHOLHDL 3.9 07/21/2023     Orders: -     Rosuvastatin  Calcium ; Take 1 tablet (10 mg total) by mouth daily.  Dispense: 90 tablet; Refill: 1  Gastroesophageal reflux disease without esophagitis Assessment & Plan: Refilled Protonix  GERD diet encouraged   Controlled gout Assessment & Plan: Refilled allopurinol    Orders: -     Allopurinol ; Take 2 tablets (200 mg total) by mouth daily.  Dispense: 180 tablet; Refill: 3  COPD mixed type (HCC) -     Albuterol  Sulfate HFA; Inhale 2 puffs into the lungs every 6 (six) hours as needed for wheezing or shortness of breath.  Dispense: 8 g; Refill: 2  Erectile dysfunction, unspecified erectile dysfunction type -     Sildenafil  Citrate; Take 1 tablet (100 mg total) by mouth daily as needed for erectile dysfunction.  Dispense: 30 tablet; Refill: 1  IFG (impaired fasting glucose) -     Hemoglobin A1c  Vitamin D  deficiency -     VITAMIN D  25 Hydroxy (Vit-D  Deficiency, Fractures)  TSH (thyroid-stimulating hormone deficiency) -     TSH + free T4  Other hyperlipidemia -     Lipid panel -     CMP14+EGFR -     CBC with Differential/Platelet  Note: This chart has been completed using Engineer, civil (consulting) software, and while attempts have been made to ensure accuracy, certain words and phrases may not be transcribed as intended.    Follow-up: Return in about 5 months (around 07/28/2024).   Thalya Fouche, FNP

## 2024-02-26 NOTE — Assessment & Plan Note (Signed)
 Smokes about 1/2 pack/day  Asked about quitting: confirms that he currently smokes cigarettes Advise to quit smoking: Educated about QUITTING to reduce the risk of cancer, cardio and cerebrovascular disease. Assess willingness: Unwilling to quit at this time, but is working on cutting back. Assist with counseling and pharmacotherapy: Counseled for 5 minutes and literature provided. Arrange for follow up: follow up in 3 months and continue to offer help.

## 2024-02-27 LAB — CMP14+EGFR
ALT: 30 IU/L (ref 0–44)
AST: 26 IU/L (ref 0–40)
Albumin: 4.3 g/dL (ref 3.8–4.9)
Alkaline Phosphatase: 67 IU/L (ref 44–121)
BUN/Creatinine Ratio: 16 (ref 9–20)
BUN: 15 mg/dL (ref 6–24)
Bilirubin Total: 0.4 mg/dL (ref 0.0–1.2)
CO2: 20 mmol/L (ref 20–29)
Calcium: 9.8 mg/dL (ref 8.7–10.2)
Chloride: 108 mmol/L — ABNORMAL HIGH (ref 96–106)
Creatinine, Ser: 0.94 mg/dL (ref 0.76–1.27)
Globulin, Total: 2 g/dL (ref 1.5–4.5)
Glucose: 137 mg/dL — ABNORMAL HIGH (ref 70–99)
Potassium: 4.5 mmol/L (ref 3.5–5.2)
Sodium: 143 mmol/L (ref 134–144)
Total Protein: 6.3 g/dL (ref 6.0–8.5)
eGFR: 98 mL/min/1.73 (ref >59)

## 2024-02-27 LAB — CBC WITH DIFFERENTIAL/PLATELET
Basophils Absolute: 0 x10E3/uL (ref 0.0–0.2)
Basos: 0 %
EOS (ABSOLUTE): 0.3 x10E3/uL (ref 0.0–0.4)
Eos: 3 %
Hematocrit: 48.3 % (ref 37.5–51.0)
Hemoglobin: 16 g/dL (ref 13.0–17.7)
Immature Grans (Abs): 0 x10E3/uL (ref 0.0–0.1)
Immature Granulocytes: 0 %
Lymphocytes Absolute: 2 x10E3/uL (ref 0.7–3.1)
Lymphs: 16 %
MCH: 32.1 pg (ref 26.6–33.0)
MCHC: 33.1 g/dL (ref 31.5–35.7)
MCV: 97 fL (ref 79–97)
Monocytes Absolute: 0.7 x10E3/uL (ref 0.1–0.9)
Monocytes: 6 %
Neutrophils Absolute: 9.1 x10E3/uL — ABNORMAL HIGH (ref 1.4–7.0)
Neutrophils: 75 %
Platelets: 203 x10E3/uL (ref 150–450)
RBC: 4.98 x10E6/uL (ref 4.14–5.80)
RDW: 12.4 % (ref 11.6–15.4)
WBC: 12.2 x10E3/uL — ABNORMAL HIGH (ref 3.4–10.8)

## 2024-02-27 LAB — LIPID PANEL
Chol/HDL Ratio: 2.3 ratio (ref 0.0–5.0)
Cholesterol, Total: 79 mg/dL — ABNORMAL LOW (ref 100–199)
HDL: 34 mg/dL — ABNORMAL LOW (ref >39)
LDL Chol Calc (NIH): 24 mg/dL (ref 0–99)
Triglycerides: 112 mg/dL (ref 0–149)
VLDL Cholesterol Cal: 21 mg/dL (ref 5–40)

## 2024-02-27 LAB — HEMOGLOBIN A1C
Est. average glucose Bld gHb Est-mCnc: 163 mg/dL
Hgb A1c MFr Bld: 7.3 % — ABNORMAL HIGH (ref 4.8–5.6)

## 2024-02-27 LAB — TSH+FREE T4
Free T4: 1.19 ng/dL (ref 0.82–1.77)
TSH: 1.69 u[IU]/mL (ref 0.450–4.500)

## 2024-02-27 LAB — VITAMIN D 25 HYDROXY (VIT D DEFICIENCY, FRACTURES): Vit D, 25-Hydroxy: 78.3 ng/mL (ref 30.0–100.0)

## 2024-03-05 ENCOUNTER — Ambulatory Visit: Payer: Self-pay | Admitting: Family Medicine

## 2024-03-05 DIAGNOSIS — E1165 Type 2 diabetes mellitus with hyperglycemia: Secondary | ICD-10-CM

## 2024-03-05 MED ORDER — GLIPIZIDE 5 MG PO TABS
5.0000 mg | ORAL_TABLET | Freq: Every day | ORAL | 3 refills | Status: DC
  Filled 2024-03-05: qty 90, 90d supply, fill #0

## 2024-03-06 ENCOUNTER — Other Ambulatory Visit (HOSPITAL_COMMUNITY): Payer: Self-pay

## 2024-03-07 ENCOUNTER — Other Ambulatory Visit: Payer: Self-pay

## 2024-03-08 ENCOUNTER — Encounter: Payer: Self-pay | Admitting: Family Medicine

## 2024-03-11 NOTE — Telephone Encounter (Signed)
 Kindly sch an appt

## 2024-03-14 ENCOUNTER — Other Ambulatory Visit: Payer: Self-pay

## 2024-03-14 ENCOUNTER — Ambulatory Visit (INDEPENDENT_AMBULATORY_CARE_PROVIDER_SITE_OTHER): Admitting: Physician Assistant

## 2024-03-14 ENCOUNTER — Encounter: Payer: Self-pay | Admitting: Physician Assistant

## 2024-03-14 DIAGNOSIS — L97911 Non-pressure chronic ulcer of unspecified part of right lower leg limited to breakdown of skin: Secondary | ICD-10-CM

## 2024-03-14 DIAGNOSIS — M5412 Radiculopathy, cervical region: Secondary | ICD-10-CM | POA: Diagnosis not present

## 2024-03-14 DIAGNOSIS — G8929 Other chronic pain: Secondary | ICD-10-CM

## 2024-03-14 MED ORDER — PREDNISONE 10 MG PO TABS
10.0000 mg | ORAL_TABLET | Freq: Every day | ORAL | 0 refills | Status: DC
  Filled 2024-03-14: qty 30, 30d supply, fill #0

## 2024-03-14 NOTE — Progress Notes (Signed)
 Office Visit Note   Patient: Chase Christensen           Date of Birth: 06-26-1971           MRN: [id number] Visit Date: 03/14/2024              Requested by: Zarwolo, Gloria, FNP [address] [place],  [place] [address] PCP: Zarwolo, Gloria, FNP  Chief Complaint  Patient presents with   Right Leg - Wound Check      HPI: Patient is a 53 year old gentleman who is seen in follow-up for traumatic ulcer of right leg. The wound has scabbed over.  He states he has not used the Vashe for a week at this point.  He went to the beach and just kept it covered.  He denies fever and chills.  He states he has return of right arm radicular pain with a history of bulging disc.  He wound like to be examined for this as well today.  He states the arm pain runs down the back of his upper arm, but he can not reproduce it with active movement.  He denies loss of motor, strength or dropping objects.    Assessment & Plan: Visit Diagnoses: No diagnosis found.  Plan: Vashe wet to dry dressing daily to right shin.  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    Follow-Up Instructions: No follow-ups on file.   Ortho Exam  Patient is alert, oriented, no adenopathy, well-dressed, normal affect, normal respiratory effort. 10 blade Ahles debridement of the scab, wound cleaned with Vashe.    He states he has a new issue.  He has radicular pain down the posterior of his right arm and history of bulging disc.  Full ROM in the cervical spine without reproduced pain.  The on and off arm pain is not reproducible with neck active motion.  Palpable radial pulse right UE, no waisting or weakness.  Grip 5/5.      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 spurring Loss of cervical lordosis       Labs: Lab Results  Component Value Date   HGBA1C 7.3 (H) 02/26/2024   HGBA1C 7.2 (H) 07/21/2023   HGBA1C 7.5 (H) 03/23/2023   LABURIC 6.8  10/13/2019   LABURIC 5.7 03/08/2019   LABURIC 9.2 (H) 03/02/2018   REPTSTATUS 06/29/2022 FINAL 06/26/2022   CULT >=100,000 COLONIES/mL ESCHERICHIA COLI (A) 06/26/2022   LABORGA ESCHERICHIA COLI (A) 06/26/2022     Lab Results  Component Value Date   ALBUMIN 4.3 02/26/2024   ALBUMIN 4.4 07/21/2023   ALBUMIN 4.2 03/23/2023    No results found for: MG Lab Results  Component Value Date   VD25OH 78.3 02/26/2024   VD25OH 52.1 07/21/2023   VD25OH 28.6 (L) 03/23/2023    No results found for: PREALBUMIN    Latest Ref Rng & Units 02/26/2024    9:22 AM 02/05/2024    9:01 AM 07/21/2023   10:26 AM  CBC EXTENDED  WBC 3.4 - 10.8 x10E3/uL 12.2  9.6  11.8   RBC 4.14 - 5.80 x10E6/uL 4.98  4.84  5.08   Hemoglobin 13.0 - 17.7 g/dL 83.9  83.8  83.5   HCT 37.5 - 51.0 % 48.3  47.1  48.6   Platelets 150 - 450 x10E3/uL 203  221  214   NEUT# 1.4 - 7.0 x10E3/uL 9.1  6.6  8.4   Lymph# 0.7 -  3.1 x10E3/uL 2.0  2.0  2.1      There is no height or weight on file to calculate BMI.  Orders:  No orders of the defined types were placed in this encounter.  No orders of the defined types were placed in this encounter.    Procedures: No procedures performed  Clinical Data: No additional findings.  ROS:  All other systems negative, except as noted in the HPI. Review of Systems  Objective: Vital Signs: There were no vitals taken for this visit.  Specialty Comments:  No specialty comments available.  PMFS History: Patient Active Problem List   Diagnosis Date Noted   Laceration of right lower leg 02/05/2024   Right leg pain 02/05/2024   Leukocytosis 05/07/2023   Upper respiratory infection 04/17/2023   Nasal congestion 03/20/2023   Hyperlipidemia associated with type 2 diabetes mellitus (HCC) 03/20/2023   Nocturnal hypoxia 08/25/2022   Mediastinal lymphadenopathy 05/07/2022   Type 2 diabetes mellitus with hyperglycemia, without long-term current use of insulin  (HCC) 09/17/2021    COPD with acute exacerbation (HCC) 03/19/2021   Controlled gout 09/13/2020   Gastroesophageal reflux disease 08/27/2017   Erectile dysfunction associated with type 2 diabetes mellitus (HCC) 06/08/2015   Hypertension associated with type 2 diabetes mellitus (HCC) 05/03/2015   Tobacco use 05/03/2015   Past Medical History:  Diagnosis Date   COPD (chronic obstructive pulmonary disease) (HCC)    Diabetes (HCC) 09/17/2021   ED (erectile dysfunction)    Gout    Hypertension    Pneumonia    Prediabetes 09/18/2020    Family History  Problem Relation Age of Onset   Hypertension Mother    Prostate cancer Father    Hypotension Father    Colon cancer Paternal Uncle    Diabetes Maternal Grandmother    Lung cancer Maternal Grandfather    Pancreatic cancer Paternal Grandmother    Colon polyps Cousin    Cancer Other        all paternal uncles and anunts passed with cancer    Past Surgical History:  Procedure Laterality Date   KNEE ARTHROSCOPY Right 03/15/2015   WISDOM TOOTH EXTRACTION     Social History   Occupational History   Occupation: servic tech/ [employer]   Occupation: Sports coach  Tobacco Use   Smoking status: Some Days    Current packs/day: 0.25    Types: Cigarettes   Smokeless tobacco: Former    Types: Engineer, drilling   Vaping status: Never Used  Substance and Sexual Activity   Alcohol use: Yes    Comment: occas   Drug use: No   Sexual activity: Yes

## 2024-03-16 NOTE — Progress Notes (Unsigned)
 Cardiology Office Note Date:  03/17/2024  ID:  Chase Christensen, DOB February 29, 1971, MRN [id number] PCP:  Zarwolo, Gloria, FNP  Cardiologist: Joelle VEAR Ren Donley, MD  Chief Complaint  Patient presents with   Hypertension      Problems Aortic atherosclerosis on CT 6/24 DM (HA1C 7.3 8/25) on [id number] and GE5 HTN/HLD AE10, LL40, RN10 Tobacco use 0.5 PPD w/ emphysema (18 pack year)  Visits  09/11:     History of Present Illness: Chase Christensen is a 53 y.o. male who presents for new visit.   He reports headaches for about a year and half that have now become daily. They always occur in the evening and he has to take advil . He denies any associated blurry vision or weakness. He had neck imaging and was found to have growth spurs. He denies any CP or dyspnea. He has tried quitting with nicotine  patch about six months ago and it helped but he ran out. He has been checking his BP at home 2x/week and it has been 130-140/80-90.    ROS: Please see the history of present illness. All other systems are reviewed and negative.   Past Medical History:  Diagnosis Date   COPD (chronic obstructive pulmonary disease) (HCC)    Diabetes (HCC) 09/17/2021   ED (erectile dysfunction)    Gout    Hypertension    Pneumonia    Prediabetes 09/18/2020    Past Surgical History:  Procedure Laterality Date   KNEE ARTHROSCOPY Right 03/15/2015   WISDOM TOOTH EXTRACTION      Current Outpatient Medications  Medication Sig Dispense Refill   albuterol  (VENTOLIN  HFA) 108 (90 Base) MCG/ACT inhaler Inhale 2 puffs into the lungs every 6 (six) hours as needed for wheezing or shortness of breath. 6.7 g 2   allopurinol  (ZYLOPRIM ) 100 MG tablet Take 2 tablets (200 mg total) by mouth daily. 180 tablet 3   amLODipine  (NORVASC ) 10 MG tablet Take 1 tablet (10 mg total) by mouth daily. 90 tablet 3   chlorhexidine  (HIBICLENS ) 4 % external liquid Apply topically daily as needed. 236 mL 0   doxycycline  (VIBRA -TABS) 100  MG tablet Take 1 tablet (100 mg total) by mouth 2 (two) times daily. 20 tablet 0   glipiZIDE  (GLUCOTROL ) 5 MG tablet Take 1 tablet (5 mg total) by mouth daily. 30 tablet 3   ipratropium (ATROVENT ) 0.03 % nasal spray Place 2 sprays into both nostrils 2 (two) times daily as needed for rhinitis. 30 mL 1   lisinopril  (ZESTRIL ) 40 MG tablet Take 1 tablet (40 mg total) by mouth daily. 90 tablet 3   metFORMIN  (GLUCOPHAGE -XR) 500 MG 24 hr tablet Take 1 tablet (500 mg total) by mouth 2 (two) times daily with a meal. 120 tablet 1   mupirocin  ointment (BACTROBAN ) 2 % Apply 1 Application topically 2 (two) times daily. 80 g 0   pantoprazole  (PROTONIX ) 40 MG tablet Take 1 tablet (40 mg total) by mouth daily before breakfast. 90 tablet 3   predniSONE  (DELTASONE ) 10 MG tablet Take 1 tablet (10 mg total) by mouth daily with breakfast. 30 tablet 0   rosuvastatin  (CRESTOR ) 10 MG tablet Take 1 tablet (10 mg total) by mouth daily. 90 tablet 1   sildenafil  (VIAGRA ) 100 MG tablet Take 1 tablet (100 mg total) by mouth daily as needed for erectile dysfunction. 30 tablet 1   Tiotropium Bromide Monohydrate  (SPIRIVA  RESPIMAT) 2.5 MCG/ACT AERS Inhale 2 puffs into the lungs daily. 12 g 1  Vitamin D , Ergocalciferol , (DRISDOL ) 1.25 MG (50000 UNIT) CAPS capsule Take 1 capsule (50,000 Units total) by mouth every 7 (seven) days. 20 capsule 1   No current facility-administered medications for this visit.    Allergies:   Penicillins   Social History:  18-pack year current smoker  Family History:  No pertinent Fhx  PHYSICAL EXAM: VS:  BP 126/84   Pulse 81   Ht 6' 3 (1.905 m)   Wt 252 lb (114.3 kg)   SpO2 94%   BMI 31.50 kg/m  , BMI Body mass index is 31.5 kg/m. GEN: Well nourished, well developed, in no acute distress HEENT: normal Neck: no JVD, carotid bruits, or masses Cardiac: RRR; no murmurs, rubs, or gallops,no edema  Respiratory:  CTAB bilaterally, normal work of breathing GI: soft, nontender, nondistended, +  BS Extremities: No LE edema Skin: warm and dry, no rash Neuro:  Strength and sensation are intact  EKG: NSR  Recent Labs: Reviewed  Studies: Reviewed  ASSESSMENT AND PLAN: [name] is a 53 y.o. male who presents for new visit.   #Headaches #Aorta atherosclerosis #DM/Obesity #HTN/HLD - Increasing episodes of daily headaches; no alarm symptoms --> CT head and neurology referral - Regarding smoking, will re-order nicotine  patch and gum and consider varenicline during next visit - Will obtain CAC score to assess need for aspirin  - Regarding DM, will d/c glipizide , start empagliflozin  10 mg daily and refer to pharmacy for GLP-1 - BP controlled here but appears elevated at home; will consider adding another agent during next visit     Signed, Joelle VEAR Ren Donley, MD  03/17/2024 2:32 PM     [facility]

## 2024-03-17 ENCOUNTER — Other Ambulatory Visit (HOSPITAL_COMMUNITY): Payer: Self-pay

## 2024-03-17 ENCOUNTER — Encounter: Payer: Self-pay | Admitting: Neurology

## 2024-03-17 ENCOUNTER — Ambulatory Visit

## 2024-03-17 ENCOUNTER — Other Ambulatory Visit: Payer: Self-pay

## 2024-03-17 VITALS — BP 126/84 | HR 81 | Ht 75.0 in | Wt 252.0 lb

## 2024-03-17 DIAGNOSIS — I152 Hypertension secondary to endocrine disorders: Secondary | ICD-10-CM

## 2024-03-17 DIAGNOSIS — Z6831 Body mass index (BMI) 31.0-31.9, adult: Secondary | ICD-10-CM

## 2024-03-17 DIAGNOSIS — E1165 Type 2 diabetes mellitus with hyperglycemia: Secondary | ICD-10-CM

## 2024-03-17 DIAGNOSIS — E1159 Type 2 diabetes mellitus with other circulatory complications: Secondary | ICD-10-CM | POA: Diagnosis not present

## 2024-03-17 DIAGNOSIS — E66811 Obesity, class 1: Secondary | ICD-10-CM

## 2024-03-17 DIAGNOSIS — E6609 Other obesity due to excess calories: Secondary | ICD-10-CM

## 2024-03-17 DIAGNOSIS — Z720 Tobacco use: Secondary | ICD-10-CM

## 2024-03-17 DIAGNOSIS — E785 Hyperlipidemia, unspecified: Secondary | ICD-10-CM

## 2024-03-17 DIAGNOSIS — R519 Headache, unspecified: Secondary | ICD-10-CM | POA: Diagnosis not present

## 2024-03-17 MED ORDER — EMPAGLIFLOZIN 10 MG PO TABS
10.0000 mg | ORAL_TABLET | Freq: Every day | ORAL | 3 refills | Status: AC
  Filled 2024-03-17: qty 90, 90d supply, fill #0
  Filled 2024-05-18 – 2024-06-13 (×2): qty 90, 90d supply, fill #1
  Filled 2024-08-05: qty 90, 90d supply, fill #2

## 2024-03-17 MED ORDER — NICOTINE 14 MG/24HR TD PT24
14.0000 mg | MEDICATED_PATCH | Freq: Every day | TRANSDERMAL | 3 refills | Status: DC
  Filled 2024-03-17: qty 28, 28d supply, fill #0
  Filled 2024-04-14: qty 28, 28d supply, fill #1
  Filled 2024-06-07: qty 28, 28d supply, fill #2

## 2024-03-17 NOTE — Patient Instructions (Signed)
 Medication Instructions:  Your physician has recommended you make the following change in your medication:   Stop Glipizide   Start Jardiance  10 mg daily.  Nicotine  14 mg/24hr patch daily.  Buy Nicotine  gum over the counter  *If you need a refill on your cardiac medications before your next appointment, please call your pharmacy*   Lab Work: None ordered If you have labs (blood work) drawn today and your tests are completely normal, you will receive your results only by: MyChart Message (if you have MyChart) OR A paper copy in the mail If you have any lab test that is abnormal or we need to change your treatment, we will call you to review the results.   Testing/Procedures: We will order CT coronary calcium  score. It will cost $99.00 and is due at time of scan.    Follow-Up: At [facility], you and your health needs are our priority.  As part of our continuing mission to provide you with exceptional heart care, we have created designated Provider Care Teams.  These Care Teams include your primary Cardiologist (physician) and Advanced Practice Providers (APPs -  Physician Assistants and Nurse Practitioners) who all work together to provide you with the care you need, when you need it.  We recommend signing up for the patient portal called MyChart.  Sign up information is provided on this After Visit Summary.  MyChart is used to connect with patients for Virtual Visits (Telemedicine).  Patients are able to view lab/test results, encounter notes, upcoming appointments, etc.  Non-urgent messages can be sent to your provider as well.   To learn more about what you can do with MyChart, go to [email or web address].    Your next appointment:   3 month(s)  The format for your next appointment:   In Person  Provider:   Dr. Ren    Other Instructions none  Important Information About Sugar

## 2024-03-21 ENCOUNTER — Ambulatory Visit (HOSPITAL_COMMUNITY)
Admission: RE | Admit: 2024-03-21 | Discharge: 2024-03-21 | Disposition: A | Discharge status: Home / Self Care | Payer: Self-pay | Source: Ambulatory Visit

## 2024-03-21 DIAGNOSIS — E785 Hyperlipidemia, unspecified: Secondary | ICD-10-CM | POA: Insufficient documentation

## 2024-03-22 ENCOUNTER — Ambulatory Visit: Payer: Self-pay

## 2024-03-28 ENCOUNTER — Encounter: Payer: Self-pay | Admitting: Pharmacist

## 2024-03-28 ENCOUNTER — Ambulatory Visit: Attending: Cardiology | Admitting: Pharmacist

## 2024-03-28 ENCOUNTER — Telehealth: Payer: Self-pay | Admitting: Pharmacy Technician

## 2024-03-28 ENCOUNTER — Telehealth: Payer: Self-pay

## 2024-03-28 ENCOUNTER — Other Ambulatory Visit (HOSPITAL_COMMUNITY): Payer: Self-pay

## 2024-03-28 ENCOUNTER — Other Ambulatory Visit: Payer: Self-pay

## 2024-03-28 VITALS — Ht 75.0 in | Wt 249.2 lb

## 2024-03-28 DIAGNOSIS — E6609 Other obesity due to excess calories: Secondary | ICD-10-CM

## 2024-03-28 DIAGNOSIS — E66811 Obesity, class 1: Secondary | ICD-10-CM

## 2024-03-28 DIAGNOSIS — Z6831 Body mass index (BMI) 31.0-31.9, adult: Secondary | ICD-10-CM | POA: Diagnosis not present

## 2024-03-28 DIAGNOSIS — E1165 Type 2 diabetes mellitus with hyperglycemia: Secondary | ICD-10-CM

## 2024-03-28 MED ORDER — SEMAGLUTIDE(0.25 OR 0.5MG/DOS) 2 MG/3ML ~~LOC~~ SOPN
0.2500 mg | PEN_INJECTOR | SUBCUTANEOUS | 0 refills | Status: DC
  Filled 2024-03-28: qty 3, 30d supply, fill #0

## 2024-03-28 NOTE — Telephone Encounter (Signed)
   Pharmacy Patient Advocate Encounter   Received notification from Pt Calls Messages that prior authorization for ozempic  is required/requested.   Insurance verification completed.   The patient is insured through [facility] [facility] .   Per test claim: PA required; PA submitted to above mentioned insurance via Latent Key/confirmation #/EOC [id number] Status is pending   Pharmacy Patient Advocate Encounter  Received notification from [facility] that Prior Authorization for ozmepic has been APPROVED from 03/28/24 to 03/27/25. Ran test claim, Copay is $0.00. This test claim was processed through [facility]- copay amounts may vary at other pharmacies due to pharmacy/plan contracts, or as the patient moves through the different stages of their insurance plan.   PA #/Case ID/Reference #: [id number]

## 2024-03-28 NOTE — Telephone Encounter (Signed)
 Called patient to inform him insurance approved Ozempic . NR - LVM. Instructed patient to call PharmD back. Ozempic  rx sent to preferred pharmacy.

## 2024-03-28 NOTE — Progress Notes (Signed)
 Patient ID: Chase Christensen                 DOB: 08-06-1970                    MRN: [id number]     HPI: Chase Christensen is a 53 y.o. male patient referred to pharmacy clinic by Dr. Donley to initiate GLP1-RA therapy. PMH is significant for T2D, HTN, COPD, and gout. Most recent A1c: 7.3 % (02/2024). Most recent BMI 31.5 kg/m.  Pt presents to PharmD visit to initiate GLP-1 therapy. Patient was last seen by Dr. Donley on 03/17/24 where he discontinued glipizide  and initiated Jardiance  10 mg daily. Pt states that he has noticed increased urination but otherwise tolerating Jardiance  well. He has not checked his BG since starting new therapy. He reports that he will resume checking BG regularly. He has questions regarding metformin  and whether he will continue taking once he begins GLP-1.   He states that he doesn't each much but when he does, he snacks on raw vegetables. He consumes ~ 1/2 16 oz diet Sundrop per day and drinks water. He eats grilled protein mostly. He walks 1 hour per day as part of work PT.   He continues to smoke cigarettes but states that he is cutting down and using nicotine  patch. He purchased a pack of cigarettes Friday and hasn't purchased another pack since.    Baseline weight and BMI: 249 lbs and 31.2 kg/m  Current weight and BMI: 249.2 and 31.2 kg/m  Goal weight:  180 - 190 lbs  Current meds that affect weight: prednisone    Diet:  Snack: raw veggies Grilled beef and chicken Drink: water, diet sundrop (1/2 16 oz bottle per day)   Exercise:  Walk 1 hour/day  Family History:  Relation Problem Comments  Mother Metallurgist) Hypertension     Father (Alive) Hypotension   Prostate cancer     Paternal Uncle Colon cancer     Maternal Grandmother (Deceased) Diabetes     Maternal Grandfather (Deceased) Lung cancer     Paternal Grandmother (Deceased) Pancreatic cancer     Paternal Grandfather (Deceased)   Cousin Colon polyps     Daughter Metallurgist)   Other Cancer all  paternal uncles and anunts passed with cancer     Social History:  Alcohol: 2 beer/week Smoking: 1 pack cigarettes lasts ~ 3 days   Labs: Lab Results  Component Value Date   HGBA1C 7.3 (H) 02/26/2024    Wt Readings from Last 1 Encounters:  03/17/24 252 lb (114.3 kg)    BP Readings from Last 1 Encounters:  03/17/24 126/84   Pulse Readings from Last 1 Encounters:  03/17/24 81       Component Value Date/Time   CHOL 79 (L) 02/26/2024 0922   TRIG 112 02/26/2024 0922   HDL 34 (L) 02/26/2024 0922   CHOLHDL 2.3 02/26/2024 0922   LDLCALC 24 02/26/2024 9077    Past Medical History:  Diagnosis Date   COPD (chronic obstructive pulmonary disease) (HCC)    Diabetes (HCC) 09/17/2021   ED (erectile dysfunction)    Gout    Hypertension    Pneumonia    Prediabetes 09/18/2020    Current Outpatient Medications on File Prior to Visit  Medication Sig Dispense Refill   albuterol  (VENTOLIN  HFA) 108 (90 Base) MCG/ACT inhaler Inhale 2 puffs into the lungs every 6 (six) hours as needed for wheezing or shortness of breath. 6.7 g 2  allopurinol  (ZYLOPRIM ) 100 MG tablet Take 2 tablets (200 mg total) by mouth daily. 180 tablet 3   amLODipine  (NORVASC ) 10 MG tablet Take 1 tablet (10 mg total) by mouth daily. 90 tablet 3   chlorhexidine  (HIBICLENS ) 4 % external liquid Apply topically daily as needed. 236 mL 0   doxycycline  (VIBRA -TABS) 100 MG tablet Take 1 tablet (100 mg total) by mouth 2 (two) times daily. 20 tablet 0   empagliflozin  (JARDIANCE ) 10 MG TABS tablet Take 1 tablet (10 mg total) by mouth daily before breakfast. 90 tablet 3   ipratropium (ATROVENT ) 0.03 % nasal spray Place 2 sprays into both nostrils 2 (two) times daily as needed for rhinitis. 30 mL 1   lisinopril  (ZESTRIL ) 40 MG tablet Take 1 tablet (40 mg total) by mouth daily. 90 tablet 3   metFORMIN  (GLUCOPHAGE -XR) 500 MG 24 hr tablet Take 1 tablet (500 mg total) by mouth 2 (two) times daily with a meal. 120 tablet 1    mupirocin  ointment (BACTROBAN ) 2 % Apply 1 Application topically 2 (two) times daily. 80 g 0   nicotine  (NICODERM CQ ) 14 mg/24hr patch Place 1 patch (14 mg total) onto the skin daily. 28 patch 3   pantoprazole  (PROTONIX ) 40 MG tablet Take 1 tablet (40 mg total) by mouth daily before breakfast. 90 tablet 3   predniSONE  (DELTASONE ) 10 MG tablet Take 1 tablet (10 mg total) by mouth daily with breakfast. 30 tablet 0   rosuvastatin  (CRESTOR ) 10 MG tablet Take 1 tablet (10 mg total) by mouth daily. 90 tablet 1   sildenafil  (VIAGRA ) 100 MG tablet Take 1 tablet (100 mg total) by mouth daily as needed for erectile dysfunction. 30 tablet 1   Tiotropium Bromide Monohydrate  (SPIRIVA  RESPIMAT) 2.5 MCG/ACT AERS Inhale 2 puffs into the lungs daily. 12 g 1   Vitamin D , Ergocalciferol , (DRISDOL ) 1.25 MG (50000 UNIT) CAPS capsule Take 1 capsule (50,000 Units total) by mouth every 7 (seven) days. 20 capsule 1   No current facility-administered medications on file prior to visit.    Allergies  Allergen Reactions   Penicillins Rash     Assessment/Plan:  1. T2D Management/GLP-1 initiation:   Most recent A1c above goal (<7%). Patient has not met goal of at least 5% of body weight loss with comprehensive lifestyle modifications alone in the past 3-6 months. Pharmacotherapy is appropriate to pursue as augmentation. Will start GLP-1. Confirmed patient has no personal or family history of medullary thyroid carcinoma (MTC) or Multiple Endocrine Neoplasia syndrome type 2 (MEN 2). Injection technique reviewed at today's visit.  Advised patient on common side effects including nausea, diarrhea, dyspepsia, decreased appetite, and fatigue. Counseled patient on reducing meal size and how to titrate medication to minimize side effects. Counseled patient to call if intolerable side effects or if experiencing dehydration, abdominal pain, or dizziness. Along with pharmacotherapy, the patient will follow dietary modifications and  aim for at least 150 minutes of moderate-intensity exercise per week, plus resistance training twice a week (as recommended by the American Heart Association). This resistance training--such as weightlifting, bodyweight exercises, or using resistance bands, adapted to the patient's ability--will help prevent muscle loss.  Advised patient to continue taking metformin  and Jardiance  at this time. Will assess need for metformin  in the future once stabilized on GLP-1 therapy.  Follow up in 1-2 days regarding coverage of GLP-1. If therapy is initiated, phone follow-ups will be conducted every 4 weeks for dose titration until the patient reaches the effective therapeutic dose (A1c <  7%) and target weight.   I agree with assessment and plan. Was present in the room during the visit   Lakedra E. White, Pharm.D [place] [facility]. [facility] [address], [place], [place] [address] Phone: [phone]; Fax: [phone]   Robbi Blanch, Pharm.D [place] [facility]. [facility] [address], [place], [place] [address] Phone: [phone]; Fax: [phone]

## 2024-03-28 NOTE — Telephone Encounter (Signed)
 Pt called back. Informed him about Ozempic 

## 2024-03-30 ENCOUNTER — Other Ambulatory Visit: Payer: Self-pay

## 2024-03-30 ENCOUNTER — Encounter: Payer: Self-pay | Admitting: Neurology

## 2024-03-30 ENCOUNTER — Other Ambulatory Visit (HOSPITAL_COMMUNITY): Payer: Self-pay

## 2024-03-30 ENCOUNTER — Ambulatory Visit (INDEPENDENT_AMBULATORY_CARE_PROVIDER_SITE_OTHER): Admitting: Neurology

## 2024-03-30 VITALS — BP 121/81 | HR 77 | Ht 75.0 in | Wt 249.0 lb

## 2024-03-30 DIAGNOSIS — M542 Cervicalgia: Secondary | ICD-10-CM

## 2024-03-30 DIAGNOSIS — G44209 Tension-type headache, unspecified, not intractable: Secondary | ICD-10-CM

## 2024-03-30 MED ORDER — CYCLOBENZAPRINE HCL 5 MG PO TABS
5.0000 mg | ORAL_TABLET | Freq: Every evening | ORAL | 3 refills | Status: AC | PRN
  Filled 2024-03-30: qty 30, 30d supply, fill #0
  Filled 2024-05-18: qty 30, 30d supply, fill #1
  Filled 2024-06-07 – 2024-08-05 (×2): qty 30, 30d supply, fill #2

## 2024-03-30 NOTE — Progress Notes (Signed)
 [facility] Neurology Division Clinic Note - Initial Visit   Date: 03/30/2024   Chase Christensen MRN: [id number] DOB: 02-29-1971   Dear Dr. Ren Ny:  Thank you for your kind referral of Chase Christensen for consultation of headaches. Although his history is well known to you, please allow us  to reiterate it for the purpose of our medical record. The patient was accompanied to the clinic by self.    Chase Christensen is a 53 y.o. right-handed male with diabetes mellitus, hypertension, COPD, GERD, and tobacco use presenting for evaluation of headaches.   IMPRESSION/PLAN: Tension headaches with cervicalgia  - Start neck PT  - Start flexeril  5mg  daily as needed for headache  - OK to take NSAIDs, limit to twice per week  - No need for imaging at this time.  If headaches persist despite neck PT and muscle relaxers, will proceed with MRI brain  Return to clinic in 4 months  ------------------------------------------------------------- History of present illness: Over the past 2-3 years, he has throbbing/Hollopeter pain over the base of his neck into the head.  It occurs every 2-3 weeks.  When present, it lasts 2-4 days and slowly improves with excedrin migraine. If he is able to take ibuprofen  at onset, duration of of headache is less.   Headache tends to occur in the afternoon.  No associated nausea, vomiting, or photophobia.  He endorses mild phonophobia.  He is still able to function.  No vision changes, weakness, or numbness/tingling.  No identifiable triggers.    He is trying to quit smoking.  He drinks 1-2 beers per week.  He works as a IT sales professional.  He lives at home with wife.    Out-side paper records, electronic medical record, and images have been reviewed where available and summarized as:  Lab Results  Component Value Date   HGBA1C 7.3 (H) 02/26/2024   Lab Results  Component Value Date   TSH 1.690 02/26/2024    Past Medical History:  Diagnosis Date   COPD (chronic  obstructive pulmonary disease) (HCC)    Diabetes (HCC) 09/17/2021   ED (erectile dysfunction)    Gout    Hypertension    Pneumonia    Prediabetes 09/18/2020    Past Surgical History:  Procedure Laterality Date   KNEE ARTHROSCOPY Right 03/15/2015   WISDOM TOOTH EXTRACTION       Medications:  Outpatient Encounter Medications as of 03/30/2024  Medication Sig   albuterol  (VENTOLIN  HFA) 108 (90 Base) MCG/ACT inhaler Inhale 2 puffs into the lungs every 6 (six) hours as needed for wheezing or shortness of breath.   allopurinol  (ZYLOPRIM ) 100 MG tablet Take 2 tablets (200 mg total) by mouth daily.   amLODipine  (NORVASC ) 10 MG tablet Take 1 tablet (10 mg total) by mouth daily.   empagliflozin  (JARDIANCE ) 10 MG TABS tablet Take 1 tablet (10 mg total) by mouth daily before breakfast.   lisinopril  (ZESTRIL ) 40 MG tablet Take 1 tablet (40 mg total) by mouth daily.   metFORMIN  (GLUCOPHAGE -XR) 500 MG 24 hr tablet Take 1 tablet (500 mg total) by mouth 2 (two) times daily with a meal.   nicotine  (NICODERM CQ ) 14 mg/24hr patch Place 1 patch (14 mg total) onto the skin daily.   pantoprazole  (PROTONIX ) 40 MG tablet Take 1 tablet (40 mg total) by mouth daily before breakfast.   predniSONE  (DELTASONE ) 10 MG tablet Take 1 tablet (10 mg total) by mouth daily with breakfast.   rosuvastatin  (CRESTOR ) 10 MG tablet Take 1  tablet (10 mg total) by mouth daily.   Semaglutide ,0.25 or 0.5MG /DOS, 2 MG/3ML SOPN Inject 0.25 mg into the skin once a week.   sildenafil  (VIAGRA ) 100 MG tablet Take 1 tablet (100 mg total) by mouth daily as needed for erectile dysfunction.   No facility-administered encounter medications on file as of 03/30/2024.    Allergies:  Allergies  Allergen Reactions   Penicillins Rash    Family History: Family History  Problem Relation Age of Onset   Hypertension Mother    Prostate cancer Father    Hypotension Father    Colon cancer Paternal Uncle    Diabetes Maternal Grandmother     Lung cancer Maternal Grandfather    Pancreatic cancer Paternal Grandmother    Colon polyps Cousin    Cancer Other        all paternal uncles and anunts passed with cancer    Social History: Social History   Tobacco Use   Smoking status: Some Days    Current packs/day: 0.50    Types: Cigarettes   Smokeless tobacco: Former    Types: Engineer, drilling   Vaping status: Never Used  Substance Use Topics   Alcohol use: Yes    Comment: occas   Drug use: No   Social History   Social History Narrative   Are you right handed or left handed? Right handed   Are you currently employed ? 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 No    Who lives with you? Wife    What type of home do you live in: 1 story or 2 story? Mobile home         Vital Signs:  BP 121/81   Pulse 77   Ht 6' 3 (1.905 m)   Wt 249 lb (112.9 kg)   SpO2 95%   BMI 31.12 kg/m   Neurological Exam: MENTAL STATUS including orientation to time, place, person, recent and remote memory, attention span and concentration, language, and fund of knowledge is normal.  Speech is not dysarthric.  CRANIAL NERVES: II:  No visual field defects.     III-IV-VI: Pupils equal round and reactive to light.  Normal conjugate, extra-ocular eye movements in all directions of gaze.  No nystagmus.  No ptosis.   V:  Normal facial sensation.    VII:  Normal facial symmetry and movements.   VIII:  Normal hearing and vestibular function.   IX-X:  Normal palatal movement.   XI:  Normal shoulder shrug and head rotation.   XII:  Normal tongue strength and range of motion, no deviation or fasciculation.  MOTOR:  Motor strength is 5/5 throughout.  No atrophy, fasciculations or abnormal movements.  No pronator drift.  There is mild cervical muscle tension.   MSRs:                                           Right        Left brachioradialis 2+  2+  biceps 2+  2+  triceps 2+  2+  patellar 2+  2+  ankle  jerk 2+  2+  Hoffman no  no  plantar response down  down   SENSORY:  Normal and symmetric perception of light touch, temperature, and vibration.    COORDINATION/GAIT: Normal finger-to- nose-finger.  Intact rapid alternating movements  bilaterally.  Gait narrow based and stable. Tandem and stressed gait intact.    Thank you for allowing me to participate in patient's care.  If I can answer any additional questions, I would be pleased to do so.    Sincerely,    Emonie Espericueta K. Tobie, DO

## 2024-03-30 NOTE — Patient Instructions (Signed)
 Start neck physiotherapy Start flexeril  5mg  daily as needed for headache.  It may make you sleepy, so you can try taking at bedtime first.  OK to take during the daytime, if you are tolerating it.

## 2024-04-04 ENCOUNTER — Ambulatory Visit (INDEPENDENT_AMBULATORY_CARE_PROVIDER_SITE_OTHER): Admitting: Physician Assistant

## 2024-04-04 DIAGNOSIS — L97911 Non-pressure chronic ulcer of unspecified part of right lower leg limited to breakdown of skin: Secondary | ICD-10-CM | POA: Diagnosis not present

## 2024-04-04 DIAGNOSIS — M79601 Pain in right arm: Secondary | ICD-10-CM

## 2024-04-04 DIAGNOSIS — M79604 Pain in right leg: Secondary | ICD-10-CM

## 2024-04-04 NOTE — Progress Notes (Unsigned)
 Office Visit Note   Patient: Chase Christensen           Date of Birth: 01/17/1971           MRN: [id number] Visit Date: 04/04/2024              Requested by: Zarwolo, Gloria, FNP [address] [place],  [place] [address] PCP: Zarwolo, Gloria, FNP  No chief complaint on file.     HPI: Patient is a 53 year old gentleman who is seen in follow-up for traumatic ulcer of right leg. The wound has scabbed over.  He states he has not used the Vashe for a week at this point.  He went to the beach and just kept it covered.  He denies fever and chills.             He states he has return of right arm radicular pain with a history of bulging disc.  He wound like to be examined for this as well today.  He states the arm pain runs down the back of his upper arm, but he can not reproduce it with active movement.  He denies loss of motor, strength or dropping objects.    On his last visit on 03/14/24 he was instructed to use  Vashe wet to dry dressing daily to right shin. 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  He states his arm pain has improved with the prednisone .  He also states he has run out of Vashe.  Of note he did see a neurologist about his HA.     Assessment & Plan: Visit Diagnoses: No diagnosis found.  Plan: ***  Follow-Up Instructions: No follow-ups on file.   Ortho Exam  Patient is alert, oriented, no adenopathy, well-dressed, normal affect, normal respiratory effort. Scab removed form right anterior shin.  Beefy red base.  Measures 0.5 cm x 0.3 cm No cellulitis or expressible drainage.  Firmness to palpation lateral wound edges, possible scar tissue.  Palpable DP pulse.      Imaging: No results found. No images are attached to the encounter.  Labs: Lab Results  Component Value Date   HGBA1C 7.3 (H) 02/26/2024   HGBA1C 7.2 (H) 07/21/2023   HGBA1C 7.5 (H) 03/23/2023   LABURIC 6.8 10/13/2019   LABURIC 5.7 03/08/2019   LABURIC 9.2  (H) 03/02/2018   REPTSTATUS 06/29/2022 FINAL 06/26/2022   CULT >=100,000 COLONIES/mL ESCHERICHIA COLI (A) 06/26/2022   LABORGA ESCHERICHIA COLI (A) 06/26/2022     Lab Results  Component Value Date   ALBUMIN 4.3 02/26/2024   ALBUMIN 4.4 07/21/2023   ALBUMIN 4.2 03/23/2023    No results found for: MG Lab Results  Component Value Date   VD25OH 78.3 02/26/2024   VD25OH 52.1 07/21/2023   VD25OH 28.6 (L) 03/23/2023    No results found for: PREALBUMIN    Latest Ref Rng & Units 02/26/2024    9:22 AM 02/05/2024    9:01 AM 07/21/2023   10:26 AM  CBC EXTENDED  WBC 3.4 - 10.8 x10E3/uL 12.2  9.6  11.8   RBC 4.14 - 5.80 x10E6/uL 4.98  4.84  5.08   Hemoglobin 13.0 - 17.7 g/dL 83.9  83.8  83.5   HCT 37.5 - 51.0 % 48.3  47.1  48.6   Platelets 150 - 450 x10E3/uL 203  221  214   NEUT# 1.4 - 7.0 x10E3/uL 9.1  6.6  8.4   Lymph# 0.7 -  3.1 x10E3/uL 2.0  2.0  2.1      There is no height or weight on file to calculate BMI.  Orders:  No orders of the defined types were placed in this encounter.  No orders of the defined types were placed in this encounter.    Procedures: No procedures performed  Clinical Data: No additional findings.  ROS:  All other systems negative, except as noted in the HPI. Review of Systems  Objective: Vital Signs: There were no vitals taken for this visit.  Specialty Comments:  No specialty comments available.  PMFS History: Patient Active Problem List   Diagnosis Date Noted   Laceration of right lower leg 02/05/2024   Right leg pain 02/05/2024   Leukocytosis 05/07/2023   Upper respiratory infection 04/17/2023   Nasal congestion 03/20/2023   Hyperlipidemia associated with type 2 diabetes mellitus (HCC) 03/20/2023   Nocturnal hypoxia 08/25/2022   Mediastinal lymphadenopathy 05/07/2022   Type 2 diabetes mellitus with hyperglycemia, without long-term current use of insulin  (HCC) 09/17/2021   COPD with acute exacerbation (HCC) 03/19/2021    Controlled gout 09/13/2020   Gastroesophageal reflux disease 08/27/2017   Erectile dysfunction associated with type 2 diabetes mellitus (HCC) 06/08/2015   Hypertension associated with type 2 diabetes mellitus (HCC) 05/03/2015   Tobacco use 05/03/2015   Past Medical History:  Diagnosis Date   COPD (chronic obstructive pulmonary disease) (HCC)    Diabetes (HCC) 09/17/2021   ED (erectile dysfunction)    Gout    Hypertension    Pneumonia    Prediabetes 09/18/2020    Family History  Problem Relation Age of Onset   Hypertension Mother    Prostate cancer Father    Hypotension Father    Colon cancer Paternal Uncle    Diabetes Maternal Grandmother    Lung cancer Maternal Grandfather    Pancreatic cancer Paternal Grandmother    Colon polyps Cousin    Cancer Other        all paternal uncles and anunts passed with cancer    Past Surgical History:  Procedure Laterality Date   KNEE ARTHROSCOPY Right 03/15/2015   WISDOM TOOTH EXTRACTION     Social History   Occupational History   Occupation: servic tech/ [employer]   Occupation: Sports coach  Tobacco Use   Smoking status: Some Days    Current packs/day: 0.50    Types: Cigarettes   Smokeless tobacco: Former    Types: Engineer, drilling   Vaping status: Never Used  Substance and Sexual Activity   Alcohol use: Yes    Comment: occas   Drug use: No   Sexual activity: Yes

## 2024-04-05 ENCOUNTER — Encounter: Payer: Self-pay | Admitting: Physician Assistant

## 2024-04-12 ENCOUNTER — Encounter (HOSPITAL_COMMUNITY): Payer: Self-pay

## 2024-04-12 ENCOUNTER — Ambulatory Visit (HOSPITAL_COMMUNITY): Attending: Neurology

## 2024-04-12 ENCOUNTER — Other Ambulatory Visit: Payer: Self-pay

## 2024-04-12 DIAGNOSIS — G4486 Cervicogenic headache: Secondary | ICD-10-CM | POA: Diagnosis not present

## 2024-04-12 DIAGNOSIS — M542 Cervicalgia: Secondary | ICD-10-CM | POA: Insufficient documentation

## 2024-04-12 DIAGNOSIS — G44209 Tension-type headache, unspecified, not intractable: Secondary | ICD-10-CM | POA: Insufficient documentation
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
## 2024-04-14 ENCOUNTER — Other Ambulatory Visit: Payer: Self-pay

## 2024-04-14 ENCOUNTER — Other Ambulatory Visit (HOSPITAL_COMMUNITY): Payer: Self-pay

## 2024-04-15 ENCOUNTER — Encounter (HOSPITAL_COMMUNITY): Payer: Self-pay

## 2024-04-15 ENCOUNTER — Ambulatory Visit (HOSPITAL_COMMUNITY)

## 2024-04-15 DIAGNOSIS — M542 Cervicalgia: Secondary | ICD-10-CM

## 2024-04-15 DIAGNOSIS — G44209 Tension-type headache, unspecified, not intractable: Secondary | ICD-10-CM | POA: Diagnosis not present

## 2024-04-15 DIAGNOSIS — G4486 Cervicogenic headache: Secondary | ICD-10-CM
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
## 2024-04-19 ENCOUNTER — Ambulatory Visit (HOSPITAL_COMMUNITY)

## 2024-04-19 DIAGNOSIS — G4486 Cervicogenic headache: Secondary | ICD-10-CM | POA: Diagnosis not present

## 2024-04-19 DIAGNOSIS — M542 Cervicalgia: Secondary | ICD-10-CM | POA: Diagnosis not present

## 2024-04-19 DIAGNOSIS — G44209 Tension-type headache, unspecified, not intractable: Secondary | ICD-10-CM | POA: Diagnosis not present
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
## 2024-04-21 ENCOUNTER — Encounter (HOSPITAL_COMMUNITY)

## 2024-04-22 ENCOUNTER — Encounter (HOSPITAL_COMMUNITY)

## 2024-04-25 ENCOUNTER — Telehealth (HOSPITAL_COMMUNITY): Payer: Self-pay

## 2024-04-25 ENCOUNTER — Encounter (HOSPITAL_COMMUNITY)

## 2024-04-25 NOTE — Telephone Encounter (Signed)
 No show #1; called and left message regarding missed appointment this morning with call back number.  1:16 PM, 04/25/24 Joane Postel Small Kam Kushnir MPT [facility] [place] [phone]

## 2024-04-28 NOTE — Telephone Encounter (Signed)
Please see other encounter, thanks.

## 2024-04-29 ENCOUNTER — Ambulatory Visit (HOSPITAL_COMMUNITY)

## 2024-04-29 ENCOUNTER — Encounter (HOSPITAL_COMMUNITY): Payer: Self-pay

## 2024-04-29 DIAGNOSIS — G4486 Cervicogenic headache: Secondary | ICD-10-CM

## 2024-04-29 DIAGNOSIS — M542 Cervicalgia: Secondary | ICD-10-CM

## 2024-04-29 DIAGNOSIS — G44209 Tension-type headache, unspecified, not intractable: Secondary | ICD-10-CM | POA: Diagnosis not present
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
## 2024-05-02 ENCOUNTER — Ambulatory Visit: Admitting: Physician Assistant

## 2024-05-02 NOTE — Progress Notes (Deleted)
 Office Visit Note   Patient: Chase Christensen           Date of Birth: 10/25/1970           MRN: [id number] Visit Date: 05/02/2024              Requested by: Edman Meade PEDLAR, FNP [address] [place],  [place] [address] PCP: Edman Meade PEDLAR, FNP  No chief complaint on file.     HPI: Patient is a 53 year old gentleman who is seen in follow-up for traumatic ulcer of right leg. He has been using Vashe wet to dry dressing changes daily.  On his last visit the wound was 0.5 cm x 0.3 cm.    Assessment & Plan: Visit Diagnoses: No diagnosis found.  Plan: ***  Follow-Up Instructions: No follow-ups on file.   Ortho Exam  Patient is alert, oriented, no adenopathy, well-dressed, normal affect, normal respiratory effort. ***    Imaging: No results found. No images are attached to the encounter.  Labs: Lab Results  Component Value Date   HGBA1C 7.3 (H) 02/26/2024   HGBA1C 7.2 (H) 07/21/2023   HGBA1C 7.5 (H) 03/23/2023   LABURIC 6.8 10/13/2019   LABURIC 5.7 03/08/2019   LABURIC 9.2 (H) 03/02/2018   REPTSTATUS 06/29/2022 FINAL 06/26/2022   CULT >=100,000 COLONIES/mL ESCHERICHIA COLI (A) 06/26/2022   LABORGA ESCHERICHIA COLI (A) 06/26/2022     Lab Results  Component Value Date   ALBUMIN 4.3 02/26/2024   ALBUMIN 4.4 07/21/2023   ALBUMIN 4.2 03/23/2023    No results found for: MG Lab Results  Component Value Date   VD25OH 78.3 02/26/2024   VD25OH 52.1 07/21/2023   VD25OH 28.6 (L) 03/23/2023    No results found for: PREALBUMIN    Latest Ref Rng & Units 02/26/2024    9:22 AM 02/05/2024    9:01 AM 07/21/2023   10:26 AM  CBC EXTENDED  WBC 3.4 - 10.8 x10E3/uL 12.2  9.6  11.8   RBC 4.14 - 5.80 x10E6/uL 4.98  4.84  5.08   Hemoglobin 13.0 - 17.7 g/dL 83.9  83.8  83.5   HCT 37.5 - 51.0 % 48.3  47.1  48.6   Platelets 150 - 450 x10E3/uL 203  221  214   NEUT# 1.4 - 7.0 x10E3/uL 9.1  6.6  8.4   Lymph# 0.7 - 3.1 x10E3/uL 2.0  2.0  2.1      There is no height or  weight on file to calculate BMI.  Orders:  No orders of the defined types were placed in this encounter.  No orders of the defined types were placed in this encounter.    Procedures: No procedures performed  Clinical Data: No additional findings.  ROS:  All other systems negative, except as noted in the HPI. Review of Systems  Objective: Vital Signs: There were no vitals taken for this visit.  Specialty Comments:  No specialty comments available.  PMFS History: Patient Active Problem List   Diagnosis Date Noted   Laceration of right lower leg 02/05/2024   Right leg pain 02/05/2024   Leukocytosis 05/07/2023   Upper respiratory infection 04/17/2023   Nasal congestion 03/20/2023   Hyperlipidemia associated with type 2 diabetes mellitus (HCC) 03/20/2023   Nocturnal hypoxia 08/25/2022   Mediastinal lymphadenopathy 05/07/2022   Type 2 diabetes mellitus with hyperglycemia, without long-term current use of insulin  (HCC) 09/17/2021   COPD with acute exacerbation (HCC) 03/19/2021   Controlled gout 09/13/2020   Gastroesophageal  reflux disease 08/27/2017   Erectile dysfunction associated with type 2 diabetes mellitus (HCC) 06/08/2015   Hypertension associated with type 2 diabetes mellitus (HCC) 05/03/2015   Tobacco use 05/03/2015   Past Medical History:  Diagnosis Date   COPD (chronic obstructive pulmonary disease) (HCC)    Diabetes (HCC) 09/17/2021   ED (erectile dysfunction)    Gout    Hypertension    Pneumonia    Prediabetes 09/18/2020    Family History  Problem Relation Age of Onset   Hypertension Mother    Prostate cancer Father    Hypotension Father    Colon cancer Paternal Uncle    Diabetes Maternal Grandmother    Lung cancer Maternal Grandfather    Pancreatic cancer Paternal Grandmother    Colon polyps Cousin    Cancer Other        all paternal uncles and anunts passed with cancer    Past Surgical History:  Procedure Laterality Date   KNEE ARTHROSCOPY  Right 03/15/2015   WISDOM TOOTH EXTRACTION     Social History   Occupational History   Occupation: servic tech/ [employer]   Occupation: Sports coach  Tobacco Use   Smoking status: Some Days    Current packs/day: 0.50    Types: Cigarettes   Smokeless tobacco: Former    Types: Engineer, Drilling   Vaping status: Never Used  Substance and Sexual Activity   Alcohol use: Yes    Comment: occas   Drug use: No   Sexual activity: Yes

## 2024-05-03 ENCOUNTER — Telehealth (HOSPITAL_COMMUNITY): Payer: Self-pay

## 2024-05-03 ENCOUNTER — Encounter (HOSPITAL_COMMUNITY)

## 2024-05-03 NOTE — Telephone Encounter (Signed)
 No show #2; left message on patient's voicemail regarding no show for appointment this morning.  11:49 AM, 05/03/24 Valeria Krisko Small Adhya Cocco MPT [facility] [place] [phone]

## 2024-05-05 ENCOUNTER — Encounter (HOSPITAL_COMMUNITY)

## 2024-05-05 ENCOUNTER — Encounter (HOSPITAL_COMMUNITY): Payer: Self-pay

## 2024-05-05 ENCOUNTER — Telehealth (HOSPITAL_COMMUNITY): Payer: Self-pay

## 2024-05-05 NOTE — Telephone Encounter (Signed)
 No show #3; called and notified patient on voicemail of no show policy and will discharge.   10:57 AM, 05/05/24 Markevius Trombetta Small Taresa Montville MPT [facility] [place] [phone]

## 2024-05-05 NOTE — Therapy (Signed)
 PHYSICAL THERAPY DISCHARGE SUMMARY  Visits from Start of Care: 4  Current functional level related to goals / functional outcomes: unknown   Remaining deficits: unknown   Education / Equipment: unknown   Patient agrees to discharge. Patient goals were not met. Patient is being discharged due to not returning since the last visit.

## 2024-05-09 ENCOUNTER — Encounter: Payer: Self-pay | Admitting: Radiology

## 2024-05-10 ENCOUNTER — Ambulatory Visit: Admitting: Neurology

## 2024-05-19 ENCOUNTER — Other Ambulatory Visit: Payer: Self-pay

## 2024-05-19 ENCOUNTER — Other Ambulatory Visit (HOSPITAL_COMMUNITY): Payer: Self-pay

## 2024-05-29 ENCOUNTER — Other Ambulatory Visit: Payer: Self-pay | Admitting: Family Medicine

## 2024-05-29 DIAGNOSIS — E1165 Type 2 diabetes mellitus with hyperglycemia: Secondary | ICD-10-CM

## 2024-06-07 ENCOUNTER — Other Ambulatory Visit: Payer: Self-pay | Admitting: Family Medicine

## 2024-06-07 ENCOUNTER — Encounter: Payer: Self-pay | Admitting: Family Medicine

## 2024-06-07 DIAGNOSIS — E1165 Type 2 diabetes mellitus with hyperglycemia: Secondary | ICD-10-CM

## 2024-06-08 ENCOUNTER — Other Ambulatory Visit (HOSPITAL_COMMUNITY): Payer: Self-pay

## 2024-06-08 ENCOUNTER — Other Ambulatory Visit: Payer: Self-pay

## 2024-06-08 MED ORDER — METFORMIN HCL ER 500 MG PO TB24
500.0000 mg | ORAL_TABLET | Freq: Two times a day (BID) | ORAL | 1 refills | Status: AC
  Filled 2024-06-08: qty 120, 60d supply, fill #0
  Filled 2024-07-18: qty 60, 30d supply, fill #1
  Filled 2024-08-05 – 2024-08-09 (×2): qty 60, 30d supply, fill #2

## 2024-06-14 ENCOUNTER — Other Ambulatory Visit (HOSPITAL_COMMUNITY): Payer: Self-pay

## 2024-06-14 ENCOUNTER — Other Ambulatory Visit: Payer: Self-pay

## 2024-06-15 NOTE — Progress Notes (Signed)
°   °  °  Cardiology Office Note Date:  06/16/2024  ID:  Chase Christensen, DOB 1970-08-09, MRN [id number] PCP:  Edman Meade PEDLAR, FNP  Cardiologist:   Joelle VEAR Ren Donley, MD  Chief Complaint  Patient presents with   Nicotine  Dependence    Problems Aortic atherosclerosis on CT 6/24 CAC 9/25 0 DM (HA1C 7.3 8/25) on [id number] and GE5 HTN/HLD Ldl 24 8/25 Tobacco use 0.5 PPD w/ emphysema (18 pack year) M: AE10, EN10, LL40, RN10, SE  Visits  09/25: Nicotine  patch and gum --> vareniciline during next visit; started EN and Pharm D for GLP-1, another agent for BP during next visit?  History of Present Illness: Chase Christensen is a 53 y.o. male who presents for follow up.   He has been doing well since last visit.  He reports improvement in his headaches since getting started on Flexeril .  It helps if he takes the medication right at symptom onset, but only happens when he is at home at the time of his headaches. He takes the Flexeril  about once a week.  He has not been checking his blood pressure at home.  He tried a patch with no improvement in his tobacco use, currently still using around half a pack to a pack a day.  He is willing to try a higher dose of the Paxil as well as Chantix.  He tried 1 dose of Ozempic  but has some GI issues with with urgent bowel movement.  ROS: Otherwise negative  Physical Exam VS:  BP 124/80   Pulse 69   Ht 6' 3 (1.905 m)   Wt 256 lb (116.1 kg)   SpO2 94%   BMI 32.00 kg/m  , BMI Body mass index is 32 kg/m. GEN: Well nourished, well developed, in no acute distress HEENT: normal Neck: no JVD, carotid bruits, or masses Cardiac: RRR; no murmurs, rubs, or gallops,no edema  Respiratory:  CTAB bilaterally, normal work of breathing GI: soft, nontender, nondistended, + BS Extremities: No LE edema Skin: warm and dry, no rash Neuro:  Strength and sensation are intact  Recent Labs: Reviewed  ASSESSMENT AND PLAN Chase Christensen is a 53 y.o. male who presents  for follow up.  - Regarding his tobacco use, we will increase his nicotine  patch to 21 mg.  Will also start Chantix and follow-up in 3 months to assess his tobacco use. - In terms of his blood pressure, I recommended that he checks his blood pressure at home 3-4 times a week. - I explained the significant cardiac benefit with Ozempic  and that a lot of the GI upset resolved with multiple doses.  I recommended that he takes 3-4 doses, and can stop the medication if he continues to have significant GI upset.  Signed, Joelle VEAR Ren Donley, MD  06/16/2024 9:06 AM    [facility]

## 2024-06-16 ENCOUNTER — Other Ambulatory Visit: Payer: Self-pay

## 2024-06-16 ENCOUNTER — Other Ambulatory Visit (HOSPITAL_COMMUNITY): Payer: Self-pay

## 2024-06-16 ENCOUNTER — Ambulatory Visit: Attending: Internal Medicine

## 2024-06-16 VITALS — BP 124/80 | HR 69 | Ht 75.0 in | Wt 256.0 lb

## 2024-06-16 DIAGNOSIS — I152 Hypertension secondary to endocrine disorders: Secondary | ICD-10-CM | POA: Diagnosis not present

## 2024-06-16 DIAGNOSIS — Z720 Tobacco use: Secondary | ICD-10-CM | POA: Diagnosis not present

## 2024-06-16 DIAGNOSIS — E1159 Type 2 diabetes mellitus with other circulatory complications: Secondary | ICD-10-CM

## 2024-06-16 DIAGNOSIS — E1169 Type 2 diabetes mellitus with other specified complication: Secondary | ICD-10-CM | POA: Diagnosis not present

## 2024-06-16 DIAGNOSIS — E785 Hyperlipidemia, unspecified: Secondary | ICD-10-CM

## 2024-06-16 DIAGNOSIS — E1165 Type 2 diabetes mellitus with hyperglycemia: Secondary | ICD-10-CM

## 2024-06-16 MED ORDER — ROSUVASTATIN CALCIUM 10 MG PO TABS
10.0000 mg | ORAL_TABLET | Freq: Every day | ORAL | 3 refills | Status: AC
  Filled 2024-06-16 – 2024-08-05 (×2): qty 90, 90d supply, fill #0

## 2024-06-16 MED ORDER — AMLODIPINE BESYLATE 10 MG PO TABS
10.0000 mg | ORAL_TABLET | Freq: Every day | ORAL | 3 refills | Status: AC
  Filled 2024-06-16 – 2024-08-05 (×2): qty 90, 90d supply, fill #0

## 2024-06-16 MED ORDER — NICOTINE 21 MG/24HR TD PT24
21.0000 mg | MEDICATED_PATCH | Freq: Every day | TRANSDERMAL | 3 refills | Status: AC
  Filled 2024-06-16: qty 28, 28d supply, fill #0
  Filled 2024-08-05: qty 28, 28d supply, fill #1

## 2024-06-16 MED ORDER — LISINOPRIL 40 MG PO TABS
40.0000 mg | ORAL_TABLET | Freq: Every day | ORAL | 3 refills | Status: AC
  Filled 2024-06-16 – 2024-08-05 (×3): qty 90, 90d supply, fill #0

## 2024-06-16 MED ORDER — VARENICLINE TARTRATE (STARTER) 0.5 MG X 11 & 1 MG X 42 PO TBPK
ORAL_TABLET | ORAL | 0 refills | Status: DC
  Filled 2024-06-16: qty 53, 30d supply, fill #0

## 2024-06-16 NOTE — Patient Instructions (Signed)
 Medication Instructions:  INCREASE YOUR NICOTINE  PATCH TO 21 MG DAILY. START TAKING CHANTIX .5 MG DAILY FOR 3 DAYS, THE INCREASE TO .5 MG TWICE DAILY FOR 4 DAYS, THEN INCREASE TO 1 MG TWICE DAILY.  Lab Work: NONE TO BE DONE TODAY.  Testing/Procedures: NONE  Follow-Up: At [facility], you and your health needs are our priority.  As part of our continuing mission to provide you with exceptional heart care, our providers are all part of one team.  This team includes your primary Cardiologist (physician) and Advanced Practice Providers or APPs (Physician Assistants and Nurse Practitioners) who all work together to provide you with the care you need, when you need it.  Your next appointment:   3 MONTHS WITH ANY APP  Provider:   Joelle VEAR Ren Donley, MD

## 2024-07-18 ENCOUNTER — Other Ambulatory Visit (HOSPITAL_COMMUNITY): Payer: Self-pay

## 2024-08-03 ENCOUNTER — Telehealth: Payer: Self-pay | Admitting: Neurology

## 2024-08-03 DIAGNOSIS — G44209 Tension-type headache, unspecified, not intractable: Secondary | ICD-10-CM

## 2024-08-03 NOTE — Progress Notes (Signed)
" ° °  Virtual Visit via Video Note The purpose of this virtual visit is to provide medical care while limiting exposure to the novel coronavirus.    Consent was obtained for video visit:  Yes.   Answered questions that patient had about telehealth interaction:  Yes.   I discussed the limitations, risks, security and privacy concerns of performing an evaluation and management service by telemedicine. I also discussed with the patient that there may be a patient responsible charge related to this service. The patient expressed understanding and agreed to proceed.  Pt location: Home Physician Location: office Name of referring provider:  Bacchus, Meade PEDLAR, FNP I connected with Chase Christensen at patients initiation/request on 08/03/2024 at  9:50 AM EST by video enabled telemedicine application and verified that I am speaking with the correct person using two identifiers. Pt MRN:  [id number] Pt DOB:  02/28/1971 Video Participants:  Chase Christensen   History of Present Illness: This is a 54 y.o. male returning for follow-up of headaches.  He has been doing well and he reports improvement in headaches, which now occurs once every 6 weeks.  When he does get a headache, the intensity is much less. Typical headache lasts about 60-62min especially if he is able to take NSAIDs.  He completed PT for about 6 weeks but felt that improvement was not until several weeks later.  He had variable benefit with flexeril , and noticed that it helped only if he took it early enough.  No new complaints.    Observations/Objective:   There were no vitals filed for this visit. Patient is awake, alert, and appears comfortable.  Oriented x 4.   Extraocular muscles are intact. No ptosis.  Face is symmetric.  Speech is not dysarthric.    Assessment and Plan:  Tension headaches, markedly improved.  Headache frequency now occurring every 1-2 months and responsive to NSAID.  He may continue to treat the headaches with NSAID or  flexeril  as needed.    Follow Up Instructions:   I discussed the assessment and treatment plan with the patient. The patient was provided an opportunity to ask questions and all were answered. The patient agreed with the plan and demonstrated an understanding of the instructions.   The patient was advised to call back or seek an in-person evaluation if the symptoms worsen or if the condition fails to improve as anticipated.  Return to clinic as needed   Onedia Vargus K Tavyn Kurka, DO  "

## 2024-08-05 ENCOUNTER — Other Ambulatory Visit: Payer: Self-pay

## 2024-08-05 ENCOUNTER — Other Ambulatory Visit: Payer: Self-pay | Admitting: Family Medicine

## 2024-08-05 DIAGNOSIS — E1165 Type 2 diabetes mellitus with hyperglycemia: Secondary | ICD-10-CM

## 2024-08-06 ENCOUNTER — Other Ambulatory Visit (HOSPITAL_COMMUNITY): Payer: Self-pay

## 2024-08-08 ENCOUNTER — Other Ambulatory Visit: Payer: Self-pay

## 2024-08-08 ENCOUNTER — Other Ambulatory Visit (HOSPITAL_COMMUNITY): Payer: Self-pay

## 2024-08-08 MED ORDER — VARENICLINE TARTRATE (STARTER) 0.5 MG X 11 & 1 MG X 42 PO TBPK
ORAL_TABLET | ORAL | 0 refills | Status: AC
  Filled 2024-08-08: qty 53, 28d supply, fill #0

## 2024-08-09 ENCOUNTER — Other Ambulatory Visit: Payer: Self-pay

## 2024-09-14 ENCOUNTER — Ambulatory Visit: Payer: Self-pay | Admitting: Cardiology
